# Patient Record
Sex: Male | Born: 1955 | Race: White | Hispanic: No | Smoking: Current every day smoker
Health system: Southern US, Community
[De-identification: ages and names within clinical notes are randomized; demographics above are authoritative.]

## PROBLEM LIST (undated history)

## (undated) DIAGNOSIS — M549 Dorsalgia, unspecified: Secondary | ICD-10-CM

## (undated) DIAGNOSIS — Z91148 Patient's other noncompliance with medication regimen for other reason: Secondary | ICD-10-CM

## (undated) DIAGNOSIS — I509 Heart failure, unspecified: Secondary | ICD-10-CM

## (undated) DIAGNOSIS — I10 Essential (primary) hypertension: Secondary | ICD-10-CM

## (undated) DIAGNOSIS — F99 Mental disorder, not otherwise specified: Secondary | ICD-10-CM

## (undated) DIAGNOSIS — F319 Bipolar disorder, unspecified: Secondary | ICD-10-CM

## (undated) DIAGNOSIS — I5032 Chronic diastolic (congestive) heart failure: Secondary | ICD-10-CM

## (undated) DIAGNOSIS — G709 Myoneural disorder, unspecified: Secondary | ICD-10-CM

## (undated) DIAGNOSIS — E1140 Type 2 diabetes mellitus with diabetic neuropathy, unspecified: Secondary | ICD-10-CM

## (undated) DIAGNOSIS — E785 Hyperlipidemia, unspecified: Secondary | ICD-10-CM

## (undated) DIAGNOSIS — G4733 Obstructive sleep apnea (adult) (pediatric): Secondary | ICD-10-CM

## (undated) DIAGNOSIS — I639 Cerebral infarction, unspecified: Secondary | ICD-10-CM

## (undated) DIAGNOSIS — G8929 Other chronic pain: Secondary | ICD-10-CM

## (undated) DIAGNOSIS — Z720 Tobacco use: Secondary | ICD-10-CM

## (undated) DIAGNOSIS — I4891 Unspecified atrial fibrillation: Secondary | ICD-10-CM

## (undated) DIAGNOSIS — Z9114 Patient's other noncompliance with medication regimen: Secondary | ICD-10-CM

## (undated) DIAGNOSIS — J449 Chronic obstructive pulmonary disease, unspecified: Secondary | ICD-10-CM

## (undated) DIAGNOSIS — Z8673 Personal history of transient ischemic attack (TIA), and cerebral infarction without residual deficits: Secondary | ICD-10-CM

## (undated) DIAGNOSIS — J9611 Chronic respiratory failure with hypoxia: Secondary | ICD-10-CM

## (undated) HISTORY — DX: Dorsalgia, unspecified: M54.9

## (undated) HISTORY — DX: Personal history of transient ischemic attack (TIA), and cerebral infarction without residual deficits: Z86.73

## (undated) HISTORY — DX: Type 2 diabetes mellitus with diabetic neuropathy, unspecified: E11.40

## (undated) HISTORY — DX: Other chronic pain: G89.29

## (undated) HISTORY — PX: NO PAST SURGERIES: SHX2092

## (undated) HISTORY — DX: Chronic diastolic (congestive) heart failure: I50.32

---

## 2006-11-20 ENCOUNTER — Emergency Department (HOSPITAL_COMMUNITY): Admission: EM | Admit: 2006-11-20 | Discharge: 2006-11-20 | Payer: Self-pay | Admitting: Emergency Medicine

## 2006-11-24 ENCOUNTER — Emergency Department (HOSPITAL_COMMUNITY): Admission: EM | Admit: 2006-11-24 | Discharge: 2006-11-24 | Payer: Self-pay | Admitting: Emergency Medicine

## 2006-11-29 ENCOUNTER — Ambulatory Visit: Payer: Self-pay | Admitting: Internal Medicine

## 2007-01-20 ENCOUNTER — Ambulatory Visit: Payer: Self-pay | Admitting: Internal Medicine

## 2007-01-26 ENCOUNTER — Emergency Department (HOSPITAL_COMMUNITY): Admission: EM | Admit: 2007-01-26 | Discharge: 2007-01-27 | Payer: Self-pay | Admitting: Emergency Medicine

## 2007-01-28 ENCOUNTER — Emergency Department (HOSPITAL_COMMUNITY): Admission: EM | Admit: 2007-01-28 | Discharge: 2007-01-28 | Payer: Self-pay | Admitting: *Deleted

## 2007-02-28 ENCOUNTER — Ambulatory Visit: Payer: Self-pay | Admitting: Internal Medicine

## 2007-02-28 LAB — CONVERTED CEMR LAB
ALT: 15 units/L (ref 0–53)
Alkaline Phosphatase: 98 units/L (ref 39–117)
BUN: 9 mg/dL (ref 6–23)
Basophils Absolute: 0 10*3/uL (ref 0.0–0.1)
Chloride: 107 meq/L (ref 96–112)
Creatinine, Ser: 1.13 mg/dL (ref 0.40–1.50)
Eosinophils Absolute: 0.2 10*3/uL (ref 0.2–0.7)
Eosinophils Relative: 1 % (ref 0–5)
Glucose, Bld: 120 mg/dL — ABNORMAL HIGH (ref 70–99)
Lymphs Abs: 2.5 10*3/uL (ref 0.7–4.0)
Monocytes Absolute: 0.6 10*3/uL (ref 0.1–1.0)
Platelets: 277 10*3/uL (ref 150–400)
RBC: 4.3 M/uL (ref 4.22–5.81)
Total Bilirubin: 0.4 mg/dL (ref 0.3–1.2)
Total Protein: 6.4 g/dL (ref 6.0–8.3)
Triglycerides: 171 mg/dL — ABNORMAL HIGH (ref <150)
WBC: 10.7 10*3/uL — ABNORMAL HIGH (ref 4.0–10.5)

## 2007-03-17 ENCOUNTER — Emergency Department (HOSPITAL_COMMUNITY): Admission: EM | Admit: 2007-03-17 | Discharge: 2007-03-17 | Payer: Self-pay | Admitting: Emergency Medicine

## 2007-03-19 ENCOUNTER — Emergency Department (HOSPITAL_COMMUNITY): Admission: EM | Admit: 2007-03-19 | Discharge: 2007-03-19 | Payer: Self-pay | Admitting: Emergency Medicine

## 2007-03-26 ENCOUNTER — Ambulatory Visit: Payer: Self-pay | Admitting: Internal Medicine

## 2007-04-15 ENCOUNTER — Ambulatory Visit: Payer: Self-pay | Admitting: Internal Medicine

## 2007-04-27 ENCOUNTER — Emergency Department (HOSPITAL_COMMUNITY): Admission: EM | Admit: 2007-04-27 | Discharge: 2007-04-27 | Payer: Self-pay | Admitting: Emergency Medicine

## 2007-04-27 ENCOUNTER — Encounter (INDEPENDENT_AMBULATORY_CARE_PROVIDER_SITE_OTHER): Payer: Self-pay | Admitting: Emergency Medicine

## 2007-05-04 ENCOUNTER — Emergency Department (HOSPITAL_COMMUNITY): Admission: EM | Admit: 2007-05-04 | Discharge: 2007-05-04 | Payer: Self-pay | Admitting: Emergency Medicine

## 2007-05-10 ENCOUNTER — Emergency Department (HOSPITAL_COMMUNITY): Admission: EM | Admit: 2007-05-10 | Discharge: 2007-05-10 | Payer: Self-pay | Admitting: Emergency Medicine

## 2007-05-12 ENCOUNTER — Ambulatory Visit: Payer: Self-pay | Admitting: Internal Medicine

## 2007-05-27 ENCOUNTER — Emergency Department (HOSPITAL_COMMUNITY): Admission: EM | Admit: 2007-05-27 | Discharge: 2007-05-27 | Payer: Self-pay | Admitting: Emergency Medicine

## 2007-05-29 ENCOUNTER — Emergency Department (HOSPITAL_COMMUNITY): Admission: EM | Admit: 2007-05-29 | Discharge: 2007-05-29 | Payer: Self-pay | Admitting: Emergency Medicine

## 2007-06-20 ENCOUNTER — Emergency Department (HOSPITAL_COMMUNITY): Admission: EM | Admit: 2007-06-20 | Discharge: 2007-06-20 | Payer: Self-pay | Admitting: Emergency Medicine

## 2007-07-21 ENCOUNTER — Ambulatory Visit: Payer: Self-pay | Admitting: Internal Medicine

## 2007-07-25 ENCOUNTER — Emergency Department (HOSPITAL_COMMUNITY): Admission: EM | Admit: 2007-07-25 | Discharge: 2007-07-25 | Payer: Self-pay | Admitting: Emergency Medicine

## 2007-07-29 ENCOUNTER — Encounter (INDEPENDENT_AMBULATORY_CARE_PROVIDER_SITE_OTHER): Payer: Self-pay | Admitting: *Deleted

## 2007-07-29 ENCOUNTER — Inpatient Hospital Stay (HOSPITAL_COMMUNITY): Admission: EM | Admit: 2007-07-29 | Discharge: 2007-07-31 | Payer: Self-pay | Admitting: Emergency Medicine

## 2007-08-21 ENCOUNTER — Emergency Department (HOSPITAL_COMMUNITY): Admission: EM | Admit: 2007-08-21 | Discharge: 2007-08-21 | Payer: Self-pay | Admitting: Emergency Medicine

## 2007-08-26 ENCOUNTER — Ambulatory Visit: Payer: Self-pay | Admitting: Internal Medicine

## 2007-08-26 ENCOUNTER — Encounter (INDEPENDENT_AMBULATORY_CARE_PROVIDER_SITE_OTHER): Payer: Self-pay | Admitting: Family Medicine

## 2007-08-26 LAB — CONVERTED CEMR LAB
Barbiturate Quant, Ur: NEGATIVE
Benzodiazepines.: NEGATIVE
Cocaine Metabolites: NEGATIVE
Marijuana Metabolite: NEGATIVE
Methadone: NEGATIVE

## 2007-09-12 ENCOUNTER — Ambulatory Visit: Payer: Self-pay | Admitting: *Deleted

## 2007-09-12 ENCOUNTER — Ambulatory Visit: Payer: Self-pay | Admitting: Internal Medicine

## 2007-09-18 ENCOUNTER — Emergency Department (HOSPITAL_COMMUNITY): Admission: EM | Admit: 2007-09-18 | Discharge: 2007-09-18 | Payer: Self-pay | Admitting: Emergency Medicine

## 2007-09-26 ENCOUNTER — Emergency Department (HOSPITAL_COMMUNITY): Admission: EM | Admit: 2007-09-26 | Discharge: 2007-09-26 | Payer: Self-pay | Admitting: Family Medicine

## 2007-10-17 ENCOUNTER — Ambulatory Visit: Payer: Self-pay | Admitting: Family Medicine

## 2007-10-17 LAB — CONVERTED CEMR LAB
BUN: 12 mg/dL (ref 6–23)
CO2: 26 meq/L (ref 19–32)
Cholesterol: 176 mg/dL (ref 0–200)
Creatinine, Ser: 1.2 mg/dL (ref 0.40–1.50)
Glucose, Bld: 221 mg/dL — ABNORMAL HIGH (ref 70–99)
Potassium: 4.3 meq/L (ref 3.5–5.3)
TSH: 0.974 microintl units/mL (ref 0.350–4.50)

## 2007-11-02 ENCOUNTER — Emergency Department (HOSPITAL_COMMUNITY): Admission: EM | Admit: 2007-11-02 | Discharge: 2007-11-02 | Payer: Self-pay | Admitting: Emergency Medicine

## 2007-11-26 ENCOUNTER — Encounter
Admission: RE | Admit: 2007-11-26 | Discharge: 2007-11-26 | Payer: Self-pay | Admitting: Physical Medicine & Rehabilitation

## 2008-05-02 ENCOUNTER — Emergency Department (HOSPITAL_COMMUNITY): Admission: EM | Admit: 2008-05-02 | Discharge: 2008-05-02 | Payer: Self-pay | Admitting: Emergency Medicine

## 2008-07-19 ENCOUNTER — Emergency Department (HOSPITAL_COMMUNITY): Admission: EM | Admit: 2008-07-19 | Discharge: 2008-07-19 | Payer: Self-pay | Admitting: Emergency Medicine

## 2008-07-24 ENCOUNTER — Emergency Department (HOSPITAL_COMMUNITY): Admission: EM | Admit: 2008-07-24 | Discharge: 2008-07-24 | Payer: Self-pay | Admitting: *Deleted

## 2008-08-04 ENCOUNTER — Emergency Department (HOSPITAL_COMMUNITY): Admission: EM | Admit: 2008-08-04 | Discharge: 2008-08-04 | Payer: Self-pay | Admitting: Emergency Medicine

## 2009-01-30 ENCOUNTER — Emergency Department (HOSPITAL_COMMUNITY): Admission: EM | Admit: 2009-01-30 | Discharge: 2009-01-30 | Payer: Self-pay | Admitting: Emergency Medicine

## 2009-03-03 ENCOUNTER — Emergency Department (HOSPITAL_COMMUNITY): Admission: EM | Admit: 2009-03-03 | Discharge: 2009-03-04 | Payer: Self-pay | Admitting: Emergency Medicine

## 2009-04-12 ENCOUNTER — Emergency Department (HOSPITAL_COMMUNITY): Admission: EM | Admit: 2009-04-12 | Discharge: 2009-04-12 | Payer: Self-pay | Admitting: Emergency Medicine

## 2009-04-16 ENCOUNTER — Emergency Department (HOSPITAL_COMMUNITY): Admission: EM | Admit: 2009-04-16 | Discharge: 2009-04-16 | Payer: Self-pay | Admitting: Emergency Medicine

## 2010-06-19 LAB — COMPREHENSIVE METABOLIC PANEL
ALT: 16 U/L (ref 0–53)
Albumin: 3.5 g/dL (ref 3.5–5.2)
Alkaline Phosphatase: 85 U/L (ref 39–117)
Calcium: 8.8 mg/dL (ref 8.4–10.5)
Chloride: 104 mEq/L (ref 96–112)
GFR calc Af Amer: >60 mL/min (ref >60)
GFR calc non Af Amer: >60 mL/min (ref >60)
Glucose, Bld: 151 mg/dL — ABNORMAL HIGH (ref 70–99)
Potassium: 3.4 mEq/L — ABNORMAL LOW (ref 3.5–5.1)
Total Protein: 6.9 g/dL (ref 6.0–8.3)

## 2010-06-19 LAB — POCT CARDIAC MARKERS
Myoglobin, poc: 78.7 ng/mL (ref 12–200)
Troponin i, poc: <0.05 ng/mL (ref 0.00–0.09)

## 2010-06-19 LAB — DIFFERENTIAL
Basophils Relative: 0 % (ref 0–1)
Lymphs Abs: 3.3 10*3/uL (ref 0.7–4.0)
Monocytes Absolute: 0.6 10*3/uL (ref 0.1–1.0)
Monocytes Relative: 5 % (ref 3–12)
Neutrophils Relative %: 68 % (ref 43–77)

## 2010-06-19 LAB — CBC
Platelets: 299 10*3/uL (ref 150–400)
RBC: 4.51 MIL/uL (ref 4.22–5.81)
WBC: 12.5 10*3/uL — ABNORMAL HIGH (ref 4.0–10.5)

## 2010-06-21 LAB — CBC
MCHC: 35 g/dL (ref 30.0–36.0)
Platelets: 240 10*3/uL (ref 150–400)
RDW: 13.1 % (ref 11.5–15.5)

## 2010-06-21 LAB — DIFFERENTIAL
Basophils Absolute: 0.1 10*3/uL (ref 0.0–0.1)
Eosinophils Relative: 2 % (ref 0–5)
Lymphocytes Relative: 21 % (ref 12–46)
Lymphs Abs: 2 10*3/uL (ref 0.7–4.0)

## 2010-06-21 LAB — BASIC METABOLIC PANEL
CO2: 34 mEq/L — ABNORMAL HIGH (ref 19–32)
Chloride: 100 mEq/L (ref 96–112)
Creatinine, Ser: 1.01 mg/dL (ref 0.4–1.5)
GFR calc non Af Amer: >60 mL/min (ref >60)
Sodium: 142 mEq/L (ref 135–145)

## 2010-06-21 LAB — URINALYSIS, ROUTINE W REFLEX MICROSCOPIC
Glucose, UA: >1000 mg/dL — AB
Hgb urine dipstick: NEGATIVE
Ketones, ur: NEGATIVE mg/dL
Specific Gravity, Urine: 1.021 (ref 1.005–1.030)
pH: 7 (ref 5.0–8.0)

## 2010-06-21 LAB — GLUCOSE, CAPILLARY: Glucose-Capillary: 200 mg/dL — ABNORMAL HIGH (ref 70–99)

## 2010-08-01 NOTE — H&P (Signed)
NAME:  Sean Warren, HETTICH NO.:  [id number]   MEDICAL RECORD NO.:  [id number]          PATIENT TYPE:  EMS   LOCATION:  MAJO                         FACILITY:  [facility]   PHYSICIAN:  Elmore Guise., M.D.DATE OF BIRTH:  20-Dec-1955   DATE OF ADMISSION:  07/29/2007  DATE OF DISCHARGE:                              HISTORY & PHYSICAL   INDICATION FOR ADMISSION:  Rapid atrial flutter.   HISTORY OF PRESENT ILLNESS:  Mr. Gervasi is a 55 year old white male with  a past medical history of hypertension, Parkinson's disease, obesity,  COPD, diabetes mellitus who presents with increasing shortness of  breath, malaise and decreased exertional tolerance.  On arrival, the  patient was noted to be in rapid atrial flutter with heart rates in the  160-170 range.  He was given adenosine, followed by Cardizem drip.  Now  his heart rate is in the 110-120 range.  He had a recent ER evaluation  on May 8.  At that time, he was started on Zithromax for possible UTI.  Over the last 2-3 days, he has had increased shortness of breath,  sweating and subjective fever, but denies palpitations or chest pain.  He has had productive cough of yellowish green phlegm.  He does continue  to smoke at least one pack per day.   REVIEW OF SYSTEMS:  Are as per HPI, otherwise negative.   CURRENT MEDICATIONS:  1. Lisinopril 40 mg daily.  2. Metformin 500 mg four times daily.  3. Zithromax 250 mg once daily.  4. Lopid 600 mg twice daily.  5. Potassium 10 mEq twice daily.  6. Cartia 240 mg daily.  7. Hydrochlorothiazide 25 mg daily.  8. Albuterol MDI every 4 hours p.r.n.  9. Sinemet 25/250 one tablet three times daily.  10.Trazodone 50 mg q.h.s.  11.BuSpar p.r.n.  12.Percocet 1-2 every 8 hours p.r.n.   ALLERGIES:  None.   FAMILY HISTORY:  Positive for heart disease and diabetes.   SOCIAL HISTORY:  Divorced.  Smokes one pack per day.  Denies alcohol.  He is ambulatory with a cane.   PHYSICAL  EXAMINATION:  VITAL SIGNS:  He is afebrile, temperature is  97.0, blood pressure 160/70, heart rate is 112 per minute, sat 99% on 2  liters nasal cannula.  He is currently on Cardizem drip at 20 mg per  hour.  GENERAL:  He is a very pleasant, middle-aged white male, alert and  oriented x4.  No acute distress.  He has no JVD and no bruits.  LUNGS:  Coarse breath sounds bilaterally with occasional wheeze.  HEART:  Irregular regular and distant.  ABDOMEN:  Obese, soft, nontender, nondistended.  EXTREMITIES:  Warm with 1+ pedal pulses and trace pretibial edema.   LABORATORY DATA:  Blood work shows an ABG of 7.47, pCO2 of 42 and pO2 of  160.  White count of 10.3, hemoglobin of 15.6, platelet count of 280.  Myoglobin of 124, MB of 1.6, Troponin I less than 0.05, potassium level  of 3.3, BUN and creatinine of 13 and 1.5 and glucose of 342.   Chest x-ray is  consistent with chronic bronchitic changes.  No acute  cardiopulmonary disease noted.  ECG showed rapid atrial flutter rate of  164 per minute with nonspecific ST-T wave changes.  His atrial flutter  is new compared to his old tracing.   IMPRESSION:  1. Newly diagnosed atrial flutter.  2. Hyperglycemia.  3. COPD.  4. Hypertension.  5. Dyslipidemia.  6. History of Parkinson's disease.   PLAN:  He will be admitted to telemetry bed.  We will continue Cardizem  drip at 20 mg an hour and start amiodarone 150 mg IV over 10 minutes  then followed by 1 mg per minute for 6 hours and 0.5 mg per minute for  18 hours.  We will check a TSH, serial cardiac enzymes as well as an  echo.  He will start on Lovenox 1 mg/kg subcu b.i.d. as well as Coumadin  and aspirin will also be started.  We will have sliding scale insulin to  help with his hyperglycemia and albuterol MDI on a p.r.n. basis.  We  will continue his Parkinson medications, Lisinopril, Lopid,  hydrochlorothiazide as before.  I will hold his metformin for now  because of his rising  creatinine.      Elmore Guise., M.D.  Electronically Signed     TWK/MEDQ  D:  07/29/2007  T:  07/29/2007  Job:  [id number]

## 2010-08-01 NOTE — Discharge Summary (Signed)
NAME:  Sean Warren, Sean Warren NO.:  [id number]   MEDICAL RECORD NO.:  [id number]          PATIENT TYPE:  INP   LOCATION:  2007                         FACILITY:  [facility]   PHYSICIAN:  Elmore Guise., M.D.DATE OF BIRTH:  01/11/1956   DATE OF ADMISSION:  07/29/2007  DATE OF DISCHARGE:  07/31/2007                               DISCHARGE SUMMARY   DISCHARGE DIAGNOSES:  1. Atrial flutter (now in normal sinus rhythm).  2. Hypertension.  3. Diabetes mellitus.  4. Dyslipidemia.  5. Chronic obstructive pulmonary disease  6. Bronchitis.  7. Parkinson disease.  8. Obesity.   HISTORY OF PRESENT ILLNESS:  Mr. Hair is a 55 year old white male with  multiple medical problems who presented with increasing shortness of  breath, malaise and fatigue.  On arrival to the emergency room, he was  noted to be in rapid atrial flutter.  He was admitted for rate control  and treatment.   HOSPITAL COURSE:  The patient's hospital course was uncomplicated.  He  was started on Cardizem drip in the emergency room with improvement in  his heart rate from the 160-170 range down into the 120 range.  He was  given amiodarone load with restoration of sinus rhythm.  He was treated  with Lovenox and started on Coumadin because of unknown duration of his  atrial arrhythmia.  His amiodarone was stopped after 24 hours.  We  decided because of long-term potential side effects to hold off on oral  dosing unless he has another breakthrough.  He has now been well rate  controlled with heart rates in the 70-80 range.  Initially, on  admission, his blood pressure was in the high 300 range.  He was  hydrated and started on sliding scale insulin with significant  improvement throughout his hospitalization.  He has now been up and  ambulatory with no significant problems.  He will be discharged home on  the following medications.  1. Lisinopril 40 mg daily.  2. Metformin 1000 mg twice daily.  3. Z-Pak.  4.  Lopid 600 mg two times daily.  5. Potassium 10 mEq two times daily.  6. Cartia 240 mg daily.  7. Hydrochlorothiazide 25 mg daily.  8. Albuterol MDI every 6 hours as needed.  9. Sinemet 25/250 one tablet three times daily.  10.Trazodone 50 mg nightly p.r.n.  11.BuSpar p.r.n.  12.Percocet p.r.n.   His new medications include  1. Coumadin 5 mg daily.  2. Baby aspirin 81 mg daily.   His labs on discharge showed a hemoglobin of 15.9, white blood cell  count of 7.9 and platelet count of 242.  His BUN and creatinine are 7  and 1.29 with a potassium level of 4.7.  His TSH was 0.376 and his INR  1.3 on discharge.  He is to come back to the office at [facility] on Monday for recheck of his INR at that time.  He has been  on Coumadin in the past but this was years ago.  If he has no further  atrial arrhythmias, I plan to treat him with Coumadin for  3 weeks and  then stopping.  His office visit will be with Dr. Reyes Ivan next Monday.  We will check a PT/INR at that time.  All his questions were answered  prior to discharge.      Elmore Guise., M.D.  Electronically Signed     TWK/MEDQ  D:  07/31/2007  T:  08/01/2007  Job:  [id number]

## 2010-12-08 LAB — D-DIMER, QUANTITATIVE: D-Dimer, Quant: 0.5 — ABNORMAL HIGH

## 2010-12-22 LAB — CBC
Hemoglobin: 13.9
RBC: 4.15 — ABNORMAL LOW
RDW: 13.1

## 2010-12-22 LAB — COMPREHENSIVE METABOLIC PANEL
Albumin: 3.2 — ABNORMAL LOW
Alkaline Phosphatase: 83
BUN: 11
Calcium: 8.8
Potassium: 3 — ABNORMAL LOW
Total Protein: 6.5

## 2010-12-22 LAB — CK TOTAL AND CKMB (NOT AT ARMC)
CK, MB: 3.4
Relative Index: 1.8
Total CK: 186

## 2010-12-22 LAB — DIFFERENTIAL
Basophils Relative: 0
Lymphocytes Relative: 17
Lymphs Abs: 1.7
Monocytes Absolute: 0.4
Monocytes Relative: 5
Neutro Abs: 7.7

## 2010-12-22 LAB — B-NATRIURETIC PEPTIDE (CONVERTED LAB): Pro B Natriuretic peptide (BNP): 34

## 2010-12-22 LAB — LITHIUM LEVEL: Lithium Lvl: <0.25 — ABNORMAL LOW

## 2010-12-26 LAB — I-STAT 8, (EC8 V) (CONVERTED LAB)
Acid-Base Excess: 7 — ABNORMAL HIGH
Acid-Base Excess: 8 — ABNORMAL HIGH
BUN: 11
Bicarbonate: 32.1 — ABNORMAL HIGH
Chloride: 102
HCT: 18 — ABNORMAL LOW
HCT: 43
Hemoglobin: 14.6
Hemoglobin: 6.1 — CL
Operator id: 151321
Operator id: 198171
Potassium: 3.6
Sodium: 140
Sodium: 141
TCO2: 33
pCO2, Ven: 44.5 — ABNORMAL LOW

## 2010-12-26 LAB — CREATININE, SERUM
Creatinine, Ser: 1.11
GFR calc non Af Amer: >60

## 2010-12-26 LAB — POCT I-STAT CREATININE
Creatinine, Ser: 1.2
Operator id: 151321

## 2010-12-26 LAB — DIFFERENTIAL
Eosinophils Absolute: 0.1
Eosinophils Relative: 1
Lymphocytes Relative: 31
Lymphs Abs: 3.1
Monocytes Relative: 6
Neutrophils Relative %: 61

## 2010-12-26 LAB — URINALYSIS, ROUTINE W REFLEX MICROSCOPIC
Glucose, UA: NEGATIVE
Ketones, ur: NEGATIVE
Nitrite: NEGATIVE
Specific Gravity, Urine: 1.03
pH: 7

## 2010-12-26 LAB — POCT CARDIAC MARKERS
Myoglobin, poc: 165
Operator id: 198171
Operator id: 198171
Troponin i, poc: <0.05
Troponin i, poc: <0.05

## 2010-12-26 LAB — D-DIMER, QUANTITATIVE: D-Dimer, Quant: 0.35

## 2010-12-26 LAB — RAPID URINE DRUG SCREEN, HOSP PERFORMED
Cocaine: NOT DETECTED
Tetrahydrocannabinol: NOT DETECTED

## 2010-12-26 LAB — CBC
HCT: 38.2 — ABNORMAL LOW
MCV: 97.8
RBC: 3.91 — ABNORMAL LOW
WBC: 9.9

## 2010-12-26 LAB — B-NATRIURETIC PEPTIDE (CONVERTED LAB): Pro B Natriuretic peptide (BNP): 111 — ABNORMAL HIGH

## 2011-05-11 ENCOUNTER — Emergency Department (HOSPITAL_COMMUNITY)
Admission: EM | Admit: 2011-05-11 | Discharge: 2011-05-11 | Disposition: A | Discharge status: Home / Self Care | Payer: Medicare Other | Attending: Emergency Medicine | Admitting: Emergency Medicine

## 2011-05-11 ENCOUNTER — Encounter (HOSPITAL_COMMUNITY): Payer: Self-pay | Admitting: Emergency Medicine

## 2011-05-11 DIAGNOSIS — F17200 Nicotine dependence, unspecified, uncomplicated: Secondary | ICD-10-CM | POA: Insufficient documentation

## 2011-05-11 DIAGNOSIS — G8929 Other chronic pain: Secondary | ICD-10-CM | POA: Insufficient documentation

## 2011-05-11 DIAGNOSIS — I10 Essential (primary) hypertension: Secondary | ICD-10-CM | POA: Insufficient documentation

## 2011-05-11 DIAGNOSIS — Z79899 Other long term (current) drug therapy: Secondary | ICD-10-CM | POA: Insufficient documentation

## 2011-05-11 DIAGNOSIS — I4891 Unspecified atrial fibrillation: Secondary | ICD-10-CM | POA: Insufficient documentation

## 2011-05-11 DIAGNOSIS — E669 Obesity, unspecified: Secondary | ICD-10-CM | POA: Insufficient documentation

## 2011-05-11 DIAGNOSIS — E119 Type 2 diabetes mellitus without complications: Secondary | ICD-10-CM | POA: Insufficient documentation

## 2011-05-11 DIAGNOSIS — E785 Hyperlipidemia, unspecified: Secondary | ICD-10-CM | POA: Insufficient documentation

## 2011-05-11 DIAGNOSIS — M549 Dorsalgia, unspecified: Secondary | ICD-10-CM | POA: Insufficient documentation

## 2011-05-11 HISTORY — DX: Unspecified atrial fibrillation: I48.91

## 2011-05-11 HISTORY — DX: Bipolar disorder, unspecified: F31.9

## 2011-05-11 HISTORY — DX: Essential (primary) hypertension: I10

## 2011-05-11 HISTORY — DX: Hyperlipidemia, unspecified: E78.5

## 2011-05-11 MED ORDER — CYCLOBENZAPRINE HCL 10 MG PO TABS: 10.0000 mg | ORAL_TABLET | Freq: Two times a day (BID) | ORAL | Status: DC | PRN

## 2011-05-11 MED ORDER — HYDROCODONE-ACETAMINOPHEN 5-500 MG PO TABS: 1.0000 | ORAL_TABLET | Freq: Four times a day (QID) | ORAL | Status: AC | PRN

## 2011-05-11 NOTE — ED Notes (Signed)
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  Patient has been taking OTC meds for pain with no relief.

## 2011-05-11 NOTE — Discharge Instructions (Signed)
Sean Warren I gave you a few Vicodin then to get you through until Tuesday when your medication arrives in [place]. Do not bend or lift any thing heavy and fell down. He can try ice on the left lower back to see if this helps the pain is well take ibuprofen 806 hours with food x24 hours. The Flexeril as a muscle relaxant may help as well. Do not drive with the Flexeril either. Opinion or consultation he needs to 20 minutes [place]. Followup with him as needed.    Back Exercises Back exercises help treat and prevent back injuries. The goal is to increase your strength in your belly (abdominal) and back muscles. These exercises can also help with flexibility. Start these exercises when told by your doctor. HOME CARE Back exercises include: Pelvic Tilt.  Lie on your back with your knees bent. Tilt your pelvis until the lower part of your back is against the floor. Hold this position 5 to 10 sec. Repeat this exercise 5 to 10 times.  Knee to Chest.  Pull 1 knee up against your chest and hold for 20 to 30 seconds. Repeat this with the other knee. This may be done with the other leg straight or bent, whichever feels better. Then, pull both knees up against your chest.  Sit-Ups or Curl-Ups.  Bend your knees 90 degrees. Start with tilting your pelvis, and do a partial, slow sit-up. Only lift your upper half 30 to 45 degrees off the floor. Take at least 2 to 3 seonds for each sit-up. Do not do sit-ups with your knees out straight. If partial sit-ups are difficult, simply do the above but with only tightening your belly (abdominal) muscles and holding it as told.  Hip-Lift.  Lie on your back with your knees flexed 90 degrees. Push down with your feet and shoulders as you raise your hips 2 inches off the floor. Hold for 10 seconds, repeat 5 to 10 times.  Back Arches.  Lie on your stomach. Prop yourself up on bent elbows. Slowly press on your hands, causing an arch in your low back. Repeat 3 to 5  times.  Shoulder-Lifts.  Lie face down with arms beside your body. Keep hips and belly pressed to floor as you slowly lift your head and shoulders off the floor.  Do not overdo your exercises. Be careful in the beginning. Exercises may cause you some mild back discomfort. If the pain lasts for more than 15 minutes, stop the exercises until you see your doctor. Improvement with exercise for back problems is slow.  Document Released: 04/07/2010 Document Revised: 11/15/2010 Document Reviewed: 04/07/2010 [facility] Patient Information 2012 [place], [place].

## 2011-05-11 NOTE — ED Notes (Signed)
Presents with c/o low back pain with history of same.  Re injured today.  States he is moving here and left his pain med at home.  States he takes Vicodin.

## 2011-05-11 NOTE — ED Notes (Signed)
MD at bedside. 

## 2011-05-11 NOTE — ED Provider Notes (Signed)
History     CSN: [id number]  Arrival date & time 05/11/11  1904   First MD Initiated Contact with Patient 05/11/11 1942      Chief Complaint  Patient presents with  . Back Pain    (Consider location/radiation/quality/duration/timing/severity/associated sxs/prior treatment) Patient is a 56 y.o. male presenting with back pain. The history is provided by the patient. No language interpreter was used.  Back Pain  This is a recurrent problem. The current episode started 6 to 12 hours ago. The problem occurs constantly. The problem has been gradually worsening. The pain is associated with lifting heavy objects. The pain is present in the lumbar spine. The quality of the pain is described as aching. The pain does not radiate. The pain is at a severity of 7/10. The pain is moderate. The symptoms are aggravated by twisting and certain positions. Pertinent negatives include no chest pain, no fever, no bowel incontinence, no perianal numbness, no bladder incontinence, no dysuria, no leg pain, no paresthesias, no tingling and no weakness. He has tried NSAIDs for the symptoms. The treatment provided no relief. Risk factors include obesity.   Care with complaint of left lower back pain that is chronic patient has his MRI reports with him from [place]. States that he is moving to [place] and he was lifting too much this morning. No fever bowel or bladder incontinence or numbness. No burning or shooting pain. States that he just needs some Vicodin to get him through until Tuesday when his medication arrives. States that he is on the Vicodin every 6 hours every day for the pain. States that he does need surgery for a bulging disc which is MRI verifies this but they will not do the surgery until he quit smoking. Past Medical History  Diagnosis Date  . Hypertension   . Diabetes mellitus   . Bipolar 1 disorder   . Hyperlipidemia   . Atrial fibrillation     History reviewed. No pertinent past  surgical history.  No family history on file.  History  Substance Use Topics  . Smoking status: Current Everyday Smoker    Types: Cigarettes  . Smokeless tobacco: Not on file  . Alcohol Use:       Review of Systems  Constitutional: Negative for fever.  Cardiovascular: Negative for chest pain.  Gastrointestinal: Negative for bowel incontinence.  Genitourinary: Negative for bladder incontinence and dysuria.  Musculoskeletal: Positive for back pain.  Neurological: Negative for tingling, weakness and paresthesias.  All other systems reviewed and are negative.    Allergies  Review of patient's allergies indicates no known allergies.  Home Medications   Current Outpatient Rx  Name Route Sig Dispense Refill  . ATENOLOL PO Oral Take 1 tablet by mouth daily.    . GLYBURIDE PO Oral Take 1 tablet by mouth every morning.    Marland Kitchen HYDROCODONE-ACETAMINOPHEN 10-325 MG PO TABS Oral Take 1 tablet by mouth every 6 (six) hours as needed. For pain    . LISINOPRIL PO Oral Take 1 tablet by mouth daily.    Marland Kitchen LITHIUM CARBONATE PO Oral Take 1 tablet by mouth daily.    Marland Kitchen OMEPRAZOLE 20 MG PO CPDR Oral Take 20 mg by mouth daily.    Marland Kitchen SIMVASTATIN 40 MG PO TABS Oral Take 40 mg by mouth every evening.    . WARFARIN SODIUM 5 MG PO TABS Oral Take 2.5-5 mg by mouth daily. Alternate 0.5 tablet and 1 tablet daily      BP  154/75  Pulse 50  Temp(Src) 98 F (36.7 C) (Oral)  Resp 17  SpO2 97%  Physical Exam  Nursing note and vitals reviewed. Constitutional: He is oriented to person, place, and time. He appears well-developed and well-nourished.  HENT:  Head: Normocephalic and atraumatic.  Eyes: Pupils are equal, round, and reactive to light.  Neck: Neck supple.  Cardiovascular: Normal rate and regular rhythm.   Pulmonary/Chest: Breath sounds normal. No respiratory distress.  Abdominal: Soft. He exhibits no distension.  Musculoskeletal: Normal range of motion. He exhibits tenderness. He exhibits no  edema.       LL back tenderness with no radiation  Neurological: He is alert and oriented to person, place, and time. No cranial nerve deficit.  Skin: Skin is warm and dry.  Psychiatric: He has a normal mood and affect.    ED Course  Procedures (including critical care time)  Labs Reviewed - No data to display No results found.   No diagnosis found.    MDM  Left lower back pain that is chronic especially since he was lifting time said that this morning. Has no radiating pain bowel or bladder problems. Patient has no nausea vomiting or fever. He is moving here from [place] he has his MRI report and hand states that he ran out of his Vicodin and has some or all the way Tuesday. We'll give him muscle relaxer, Vicodin and use ice as needed for the pain. Or so followup as needed. Return if worse.        Jethro Bastos, NP 05/11/11 2001

## 2011-05-11 NOTE — ED Provider Notes (Signed)
Medical screening examination/treatment/procedure(s) were performed by non-physician practitioner and as supervising physician I was immediately available for consultation/collaboration.  Juliet Rude. Rubin Payor, MD 05/11/11 2020

## 2011-05-11 NOTE — ED Notes (Signed)
MD at bedside.  NP at bedside 

## 2011-05-13 ENCOUNTER — Encounter (HOSPITAL_COMMUNITY): Payer: Self-pay | Admitting: *Deleted

## 2011-05-13 ENCOUNTER — Emergency Department (HOSPITAL_COMMUNITY): Payer: Medicare Other

## 2011-05-13 ENCOUNTER — Other Ambulatory Visit: Payer: Self-pay

## 2011-05-13 ENCOUNTER — Inpatient Hospital Stay (HOSPITAL_COMMUNITY): Payer: Medicare Other

## 2011-05-13 ENCOUNTER — Inpatient Hospital Stay (HOSPITAL_COMMUNITY)
Admission: EM | Admit: 2011-05-13 | Discharge: 2011-05-15 | DRG: 313 | Disposition: A | Discharge status: Home / Self Care | Payer: Medicare Other | Source: Ambulatory Visit | Attending: Cardiovascular Disease | Admitting: Cardiovascular Disease

## 2011-05-13 DIAGNOSIS — E119 Type 2 diabetes mellitus without complications: Secondary | ICD-10-CM

## 2011-05-13 DIAGNOSIS — Z7982 Long term (current) use of aspirin: Secondary | ICD-10-CM

## 2011-05-13 DIAGNOSIS — E876 Hypokalemia: Secondary | ICD-10-CM

## 2011-05-13 DIAGNOSIS — E785 Hyperlipidemia, unspecified: Secondary | ICD-10-CM

## 2011-05-13 DIAGNOSIS — I498 Other specified cardiac arrhythmias: Secondary | ICD-10-CM | POA: Diagnosis present

## 2011-05-13 DIAGNOSIS — R079 Chest pain, unspecified: Secondary | ICD-10-CM | POA: Diagnosis present

## 2011-05-13 DIAGNOSIS — J449 Chronic obstructive pulmonary disease, unspecified: Secondary | ICD-10-CM | POA: Diagnosis present

## 2011-05-13 DIAGNOSIS — R0789 Other chest pain: Principal | ICD-10-CM | POA: Diagnosis present

## 2011-05-13 DIAGNOSIS — Z7901 Long term (current) use of anticoagulants: Secondary | ICD-10-CM

## 2011-05-13 DIAGNOSIS — Z720 Tobacco use: Secondary | ICD-10-CM

## 2011-05-13 DIAGNOSIS — I4891 Unspecified atrial fibrillation: Secondary | ICD-10-CM

## 2011-05-13 DIAGNOSIS — F17200 Nicotine dependence, unspecified, uncomplicated: Secondary | ICD-10-CM | POA: Diagnosis present

## 2011-05-13 DIAGNOSIS — I639 Cerebral infarction, unspecified: Secondary | ICD-10-CM

## 2011-05-13 DIAGNOSIS — Z8673 Personal history of transient ischemic attack (TIA), and cerebral infarction without residual deficits: Secondary | ICD-10-CM

## 2011-05-13 DIAGNOSIS — I10 Essential (primary) hypertension: Secondary | ICD-10-CM

## 2011-05-13 DIAGNOSIS — F319 Bipolar disorder, unspecified: Secondary | ICD-10-CM

## 2011-05-13 DIAGNOSIS — Z79899 Other long term (current) drug therapy: Secondary | ICD-10-CM

## 2011-05-13 DIAGNOSIS — J4489 Other specified chronic obstructive pulmonary disease: Secondary | ICD-10-CM | POA: Diagnosis present

## 2011-05-13 DIAGNOSIS — K5900 Constipation, unspecified: Secondary | ICD-10-CM | POA: Diagnosis present

## 2011-05-13 DIAGNOSIS — I509 Heart failure, unspecified: Secondary | ICD-10-CM | POA: Diagnosis present

## 2011-05-13 DIAGNOSIS — G4733 Obstructive sleep apnea (adult) (pediatric): Secondary | ICD-10-CM

## 2011-05-13 HISTORY — DX: Chronic obstructive pulmonary disease, unspecified: J44.9

## 2011-05-13 HISTORY — DX: Heart failure, unspecified: I50.9

## 2011-05-13 HISTORY — DX: Obstructive sleep apnea (adult) (pediatric): G47.33

## 2011-05-13 LAB — DIFFERENTIAL
Lymphocytes Relative: 36 % (ref 12–46)
Lymphs Abs: 2.2 10*3/uL (ref 0.7–4.0)
Monocytes Absolute: 0.3 10*3/uL (ref 0.1–1.0)
Monocytes Relative: 4 % (ref 3–12)

## 2011-05-13 LAB — POCT I-STAT TROPONIN I: Troponin i, poc: 0.01 ng/mL (ref 0.00–0.08)

## 2011-05-13 LAB — CARDIAC PANEL(CRET KIN+CKTOT+MB+TROPI)
CK, MB: 2.5 ng/mL (ref 0.3–4.0)
CK, MB: 2.6 ng/mL (ref 0.3–4.0)
Total CK: 46 U/L (ref 7–232)

## 2011-05-13 LAB — COMPREHENSIVE METABOLIC PANEL
ALT: 5 U/L (ref 0–53)
Albumin: 3.4 g/dL — ABNORMAL LOW (ref 3.5–5.2)
Alkaline Phosphatase: 71 U/L (ref 39–117)
BUN: 12 mg/dL (ref 6–23)
BUN: 11 mg/dL (ref 6–23)
CO2: 28 mEq/L (ref 19–32)
Calcium: 9.7 mg/dL (ref 8.4–10.5)
Creatinine, Ser: 1.19 mg/dL (ref 0.50–1.35)
GFR calc Af Amer: >90 mL/min (ref >90)
GFR calc non Af Amer: 78 mL/min — ABNORMAL LOW (ref >90)
Glucose, Bld: 100 mg/dL — ABNORMAL HIGH (ref 70–99)
Potassium: 3 mEq/L — ABNORMAL LOW (ref 3.5–5.1)
Sodium: 144 mEq/L (ref 135–145)
Total Bilirubin: 0.3 mg/dL (ref 0.3–1.2)
Total Bilirubin: 0.2 mg/dL — ABNORMAL LOW (ref 0.3–1.2)
Total Protein: 6.6 g/dL (ref 6.0–8.3)

## 2011-05-13 LAB — CBC
MCV: 94.4 fL (ref 78.0–100.0)
Platelets: 226 10*3/uL (ref 150–400)
RDW: 13.7 % (ref 11.5–15.5)
WBC: 6 10*3/uL (ref 4.0–10.5)

## 2011-05-13 LAB — PROTIME-INR
INR: 1.13 (ref 0.00–1.49)
Prothrombin Time: 14.7 seconds (ref 11.6–15.2)
Prothrombin Time: 15.7 seconds — ABNORMAL HIGH (ref 11.6–15.2)

## 2011-05-13 LAB — URINALYSIS, ROUTINE W REFLEX MICROSCOPIC
Glucose, UA: NEGATIVE mg/dL
Leukocytes, UA: NEGATIVE
Protein, ur: NEGATIVE mg/dL
Specific Gravity, Urine: 1.009 (ref 1.005–1.030)

## 2011-05-13 LAB — PRO B NATRIURETIC PEPTIDE: Pro B Natriuretic peptide (BNP): 1203 pg/mL — ABNORMAL HIGH (ref 0–125)

## 2011-05-13 LAB — LITHIUM LEVEL: Lithium Lvl: <0.25 mEq/L — ABNORMAL LOW (ref 0.80–1.40)

## 2011-05-13 LAB — MAGNESIUM: Magnesium: 2.4 mg/dL (ref 1.5–2.5)

## 2011-05-13 MED ORDER — POLYETHYLENE GLYCOL 3350 17 G PO PACK
17.0000 g | PACK | Freq: Every day | ORAL | Status: DC
  Administered 2011-05-13 – 2011-05-14 (×2): 17 g via ORAL
  Filled 2011-05-13 (×2): qty 1

## 2011-05-13 MED ORDER — ACETAMINOPHEN 325 MG PO TABS
650.0000 mg | ORAL_TABLET | ORAL | Status: DC | PRN
  Administered 2011-05-14: 650 mg via ORAL
  Filled 2011-05-13: qty 2

## 2011-05-13 MED ORDER — POTASSIUM CHLORIDE CRYS ER 20 MEQ PO TBCR
40.0000 meq | EXTENDED_RELEASE_TABLET | Freq: Once | ORAL | Status: AC
  Administered 2011-05-13: 40 meq via ORAL
  Filled 2011-05-13: qty 2

## 2011-05-13 MED ORDER — NITROGLYCERIN IN D5W 200-5 MCG/ML-% IV SOLN
5.0000 ug/min | Freq: Once | INTRAVENOUS | Status: AC
  Administered 2011-05-13: 5 ug/min via INTRAVENOUS
  Filled 2011-05-13: qty 250

## 2011-05-13 MED ORDER — INSULIN ASPART 100 UNIT/ML ~~LOC~~ SOLN
0.0000 [IU] | Freq: Three times a day (TID) | SUBCUTANEOUS | Status: DC
  Filled 2011-05-13: qty 3

## 2011-05-13 MED ORDER — ASPIRIN 81 MG PO CHEW: 324.0000 mg | CHEWABLE_TABLET | ORAL | Status: DC

## 2011-05-13 MED ORDER — HEPARIN SOD (PORCINE) IN D5W 100 UNIT/ML IV SOLN
1900.0000 [IU]/h | INTRAVENOUS | Status: DC
  Administered 2011-05-13: 1250 [IU]/h via INTRAVENOUS
  Administered 2011-05-14: 1600 [IU]/h via INTRAVENOUS
  Filled 2011-05-13 (×2): qty 250

## 2011-05-13 MED ORDER — ONDANSETRON HCL 4 MG/2ML IJ SOLN
4.0000 mg | Freq: Four times a day (QID) | INTRAMUSCULAR | Status: DC | PRN
  Administered 2011-05-14 – 2011-05-15 (×3): 4 mg via INTRAVENOUS
  Filled 2011-05-13 (×3): qty 2

## 2011-05-13 MED ORDER — GI COCKTAIL ~~LOC~~
30.0000 mL | Freq: Once | ORAL | Status: AC
  Administered 2011-05-13: 30 mL via ORAL
  Filled 2011-05-13: qty 30

## 2011-05-13 MED ORDER — PANTOPRAZOLE SODIUM 40 MG PO TBEC
40.0000 mg | DELAYED_RELEASE_TABLET | Freq: Every day | ORAL | Status: DC
  Administered 2011-05-13 – 2011-05-14 (×2): 40 mg via ORAL
  Filled 2011-05-13 (×2): qty 1

## 2011-05-13 MED ORDER — ASPIRIN EC 81 MG PO TBEC
81.0000 mg | DELAYED_RELEASE_TABLET | Freq: Every day | ORAL | Status: DC
  Administered 2011-05-14 – 2011-05-15 (×2): 81 mg via ORAL
  Filled 2011-05-13 (×2): qty 1

## 2011-05-13 MED ORDER — ASPIRIN 81 MG PO CHEW
324.0000 mg | CHEWABLE_TABLET | Freq: Once | ORAL | Status: AC
  Administered 2011-05-13: 324 mg via ORAL
  Filled 2011-05-13: qty 4

## 2011-05-13 MED ORDER — MORPHINE SULFATE 4 MG/ML IJ SOLN
4.0000 mg | Freq: Once | INTRAMUSCULAR | Status: AC
  Administered 2011-05-13: 4 mg via INTRAVENOUS
  Filled 2011-05-13: qty 1

## 2011-05-13 MED ORDER — ATROPINE SULFATE 1 MG/ML IJ SOLN
INTRAMUSCULAR | Status: AC
  Filled 2011-05-13: qty 1

## 2011-05-13 MED ORDER — HEPARIN BOLUS VIA INFUSION
2000.0000 [IU] | Freq: Once | INTRAVENOUS | Status: AC
  Administered 2011-05-13: 2000 [IU] via INTRAVENOUS
  Filled 2011-05-13: qty 2000

## 2011-05-13 MED ORDER — ONDANSETRON HCL 4 MG/2ML IJ SOLN
4.0000 mg | Freq: Once | INTRAMUSCULAR | Status: AC
  Administered 2011-05-13: 4 mg via INTRAVENOUS
  Filled 2011-05-13: qty 2

## 2011-05-13 MED ORDER — NITROGLYCERIN IN D5W 200-5 MCG/ML-% IV SOLN
3.0000 ug/min | INTRAVENOUS | Status: DC
  Administered 2011-05-14: 10 ug/min via INTRAVENOUS

## 2011-05-13 MED ORDER — ATROPINE SULFATE 0.1 MG/ML IJ SOLN
0.5000 mg | Freq: Once | INTRAMUSCULAR | Status: AC
  Administered 2011-05-13: 0.5 mg via INTRAVENOUS
  Filled 2011-05-13 (×2): qty 10

## 2011-05-13 MED ORDER — NITROGLYCERIN 0.4 MG SL SUBL
0.4000 mg | SUBLINGUAL_TABLET | SUBLINGUAL | Status: DC | PRN
  Administered 2011-05-14 (×3): 0.4 mg via SUBLINGUAL

## 2011-05-13 MED ORDER — SODIUM CHLORIDE 0.9 % IV SOLN: Freq: Once | INTRAVENOUS | Status: DC

## 2011-05-13 MED ORDER — HEPARIN BOLUS VIA INFUSION
4000.0000 [IU] | Freq: Once | INTRAVENOUS | Status: AC
  Administered 2011-05-13: 4000 [IU] via INTRAVENOUS

## 2011-05-13 MED ORDER — SIMVASTATIN 40 MG PO TABS
40.0000 mg | ORAL_TABLET | Freq: Every day | ORAL | Status: DC
  Administered 2011-05-13 – 2011-05-14 (×2): 40 mg via ORAL
  Filled 2011-05-13 (×3): qty 1

## 2011-05-13 MED ORDER — ATROPINE SULFATE 1 MG/ML IJ SOLN
0.5000 mg | Freq: Once | INTRAMUSCULAR | Status: DC
  Filled 2011-05-13: qty 1

## 2011-05-13 MED ORDER — POTASSIUM CHLORIDE 10 MEQ/100ML IV SOLN
10.0000 meq | Freq: Once | INTRAVENOUS | Status: AC
  Administered 2011-05-13: 10 meq via INTRAVENOUS
  Filled 2011-05-13: qty 100

## 2011-05-13 MED ORDER — SODIUM CHLORIDE 0.9 % IV SOLN
INTRAVENOUS | Status: DC
  Administered 2011-05-13: 12:00:00 via INTRAVENOUS

## 2011-05-13 MED ORDER — NITROGLYCERIN 0.4 MG SL SUBL
0.4000 mg | SUBLINGUAL_TABLET | SUBLINGUAL | Status: DC | PRN
  Administered 2011-05-13 (×2): 0.4 mg via SUBLINGUAL
  Filled 2011-05-13: qty 25

## 2011-05-13 NOTE — ED Notes (Signed)
HR noted to be 48, EDP notified

## 2011-05-13 NOTE — ED Notes (Signed)
Pt reporting 10/10 central CP. No radiation, Confirming SOB, nausea. Reporting " I know something is in there" regarding his CP. Pt noted to be 83 HR on monitor. Hypertensive. EDP aware. External pacing at bedside along with crash cart. Pt on the pads

## 2011-05-13 NOTE — ED Notes (Signed)
Patient reports he had onset of chest pressure, chills, and n/v today.  He took a nitro at 0630 w/o relief.   He states it feels like something is lodged in his chest.  He also reports tingling in his left arm.

## 2011-05-13 NOTE — ED Notes (Signed)
Pt noted to be 38 HR. CP 8/10 central, reporting SOB. Pt on 2 L. Clarified with EDP regarding 160 cc/hr fluid with hx of CHF. Pt alert and oriented. Responding appropriately.

## 2011-05-13 NOTE — Progress Notes (Signed)
I was called to assess Sean Warren on the basis of his ongoing pain.  He is 56 year old gentleman with a history of Afib, diabetes, hypertension, and a reported history of CHF who presents with epigastric discomfort since around 6 am today. He was seen by Dr. Mayford Knife this afternoon and admitted to the step down unit on IV infusions of Heparin and Nitroglycerine.   His ECG's have not been suggestive of ischemia and his initial cardiac troponin was normal. A repeat troponin this evening is mildly elevated at 0.38. BNP 1203, D-Dimer normal. Initial K was 2.7, then 3.0 following repletion. WBC normal, as are his LFTs.   On my exam, he is in mild distress, but is able to carry on a normal conversation. HR 60, BP 186/93, RR 16, Pulse Ox 99% 2L [place]. Non-toxic appearing without diaphoresis. He is not using accessory muscle to assist his breathing. His JVP is challenging to assess given his body habitus. I do not hear crackle on lung exam. Abdomen is soft & obese. He has point tenderness in the epigastric and LUQ regions. No rebound tenderness. No guarding. No RUQ tenderness. Extremities are warm and well-perfused. No edema.   The etiology of his pain remains a bit unclear. His symptoms are not typical of a coronary syndrome, particularly in the pinpoint, reproducible tenderness that he feels in the epigastric and LUQ regions. His ECG does not suggest ischemia, though the possibility exists that this is electrocardiographically silent ischemia (circumflex territory). His troponin 0.38 from this evening muddies the waters a bit. We will need to keep a close an eye on his enzyme trend over night. With a normal D-dimer, no hypoxia, no tachycardia, and no exam findings suggestive of a DVT, I do not suspect a pulmonary embolism. With hypokalemia, abdominal tenderness, and no bowel movements in the past 72 hours, this could be explained by constipation and/or an ileus.   Plan for now: - Abdominal plain film to look for  dilated bowel loops - Potassium repletion and laxative administration to encourage a bowel movement - Continued trending of cardiac enzymes - Continued IV Heparin. IV NTG drip to be titrated to a SBP in the 110-120 range and/or pain relief.  - If his enzymes continue to trend up without symptom relief as we purse alternative diagnoses, then we will re-visit the need for coronary angiography.   I conveyed my impressions to Dr. Mayford Knife by phone. Plan discussed with the patient and his questions were answered.  Sean Pontes, MD Cardiology Fellow On-Call  [phone]

## 2011-05-13 NOTE — Consult Note (Signed)
ANTICOAGULATION CONSULT NOTE - Initial Consult  Pharmacy Consult for Heparin Indication: CP/afib  No Known Allergies  Patient Measurements: Height: 5\' 8"  (172.7 cm) Weight: 216 lb (97.977 kg) IBW/kg (Calculated) : 68.4  Heparin Dosing Weight: 89.2kg  Vital Signs: Temp: 98.3 F (36.8 C) (02/24 1100) Temp src: Oral (02/24 1100) BP: 151/63 mmHg (02/24 1530) Pulse Rate: 44  (02/24 1530)  Labs:  Basename 05/13/11 1138  HGB --  HCT --  PLT --  APTT --  LABPROT 14.7  INR 1.13  HEPARINUNFRC --  CREATININE 1.19  CKTOTAL --  CKMB --  TROPONINI --   Estimated Creatinine Clearance: 77.7 ml/min (by C-G formula based on Cr of 1.19).  Medical History: Past Medical History  Diagnosis Date  . Hypertension   . Diabetes mellitus   . Bipolar 1 disorder   . Hyperlipidemia   . Atrial fibrillation   . COPD (chronic obstructive pulmonary disease)   . Obstructive sleep apnea   . CHF (congestive heart failure)    Assessment: 55yom on coumadin pta for afib. INR on admission is subtherapeutic. Patient admitted with CP and to begin heparin for bridging.  Goal of Therapy:  Heparin level 0.3-0.7 units/ml   Plan:  1) Heparin bolus 4000 units x 1 2) Heparin drip at 1250 units/hr 3) 6h heparin level 4) Daily heparin level and CBC  Fredrik Rigger 05/13/2011,3:54 PM

## 2011-05-13 NOTE — ED Notes (Signed)
ekg to Dr.Davidson.

## 2011-05-13 NOTE — ED Notes (Signed)
Patient reports onset of chest pain today at 0600 with n/v and sweating.  Patient has hx of heartfailure and hypertension.  He is on coumadin,  Post cva.  Patient noted to be bradycardic on monitor.  Patient placed in room 12 and ERMD made aware of vital signs.  Patient reports he has not had an MI.  Patient has hx of afib, states his heart usually runs fast.  Patient is on atenolol

## 2011-05-13 NOTE — ED Notes (Signed)
New EKG done b/c of increase in heart rate after Atropine given.

## 2011-05-13 NOTE — ED Notes (Signed)
Getting ready to give pt atropine for HR 48, Dr. Mayford Knife at bedside, reporting no need to give atropine to patient .Withheld medication per Admitting

## 2011-05-13 NOTE — ED Provider Notes (Addendum)
History     CSN: [id number]  Arrival date & time 05/13/11  1056   First MD Initiated Contact with Patient 05/13/11 1114      Chief Complaint  Patient presents with  . Chest Pain    (Consider location/radiation/quality/duration/timing/severity/associated sxs/prior treatment) HPI Comments: Pt had onset of chest pain felt in the center of the chest about 6 A.M.  He feels the pain in the center of the chest.  It is like something stuck in his chest, though he is able to swallow liquids.  The pain radiated to the left arm, with some tingling feeling.  He has a history of atrial fibrillation, CHF, diabetes, COPD, chronic back pain.  He had an admission for rapid atrial fibrillation at [facility] in 2009.  He has subsequently lived in [place], and recently moved back to [place].  Patient is a 56 y.o. male presenting with chest pain.  Chest Pain The chest pain began 3 - 5 hours ago. Episode Length: A constant pain since onset at 6 A.M. The chest pain is unchanged. At its most intense, the pain is at 10/10. The pain is currently at 10/10. The severity of the pain is severe. Quality: Like something stuck in his chest. The pain radiates to the left arm. Primary symptoms include shortness of breath. Pertinent negatives for primary symptoms include no abdominal pain, no nausea and no vomiting. He tried nothing for the symptoms. Risk factors include male gender, obesity, lack of exercise and sedentary lifestyle.  His past medical history is significant for COPD, CHF and diabetes. Past medical history comments: Atrial fibrillation     Past Medical History  Diagnosis Date  . Hypertension   . Diabetes mellitus   . Bipolar 1 disorder   . Hyperlipidemia   . Atrial fibrillation     No past surgical history on file.  No family history on file.  History  Substance Use Topics  . Smoking status: Current Everyday Smoker    Types: Cigarettes  . Smokeless tobacco: Not on file  . Alcohol  Use:       Review of Systems  Constitutional: Negative.   HENT: Negative.   Eyes: Negative.   Respiratory: Positive for shortness of breath.   Cardiovascular: Positive for chest pain.  Gastrointestinal: Negative for nausea, vomiting, abdominal pain and diarrhea.  Genitourinary: Negative.   Musculoskeletal: Positive for back pain.  Skin: Negative.   Neurological: Negative.   Psychiatric/Behavioral: Negative.     Allergies  Review of patient's allergies indicates no known allergies.  Home Medications   Current Outpatient Rx  Name Route Sig Dispense Refill  . ATENOLOL PO Oral Take 1 tablet by mouth daily.    . CYCLOBENZAPRINE HCL 10 MG PO TABS Oral Take 1 tablet (10 mg total) by mouth 2 (two) times daily as needed for muscle spasms. 20 tablet 0  . GLYBURIDE PO Oral Take 1 tablet by mouth every morning.    Marland Kitchen HYDROCODONE-ACETAMINOPHEN 10-325 MG PO TABS Oral Take 1 tablet by mouth every 6 (six) hours as needed. For pain    . HYDROCODONE-ACETAMINOPHEN 5-500 MG PO TABS Oral Take 1-2 tablets by mouth every 6 (six) hours as needed for pain. 15 tablet 0  . LISINOPRIL PO Oral Take 1 tablet by mouth daily.    Marland Kitchen LITHIUM CARBONATE PO Oral Take 1 tablet by mouth daily.    Marland Kitchen OMEPRAZOLE 20 MG PO CPDR Oral Take 20 mg by mouth daily.    Marland Kitchen SIMVASTATIN 40  MG PO TABS Oral Take 40 mg by mouth every evening.    . WARFARIN SODIUM 5 MG PO TABS Oral Take 2.5-5 mg by mouth daily. Alternate 0.5 tablet and 1 tablet daily      BP 179/69  Pulse 39  Temp(Src) 98.3 F (36.8 C) (Oral)  Resp 22  SpO2 97%  Physical Exam  Constitutional: He is oriented to person, place, and time.       Obese middle aged man with complaint of chest pain, and bradycardia noted on EKG and on monitor.  HENT:  Head: Normocephalic and atraumatic.  Right Ear: External ear normal.  Left Ear: External ear normal.  Mouth/Throat: Oropharynx is clear and moist.  Eyes: Conjunctivae and EOM are normal. Pupils are equal, round, and  reactive to light.  Neck: Normal range of motion. No JVD present. No thyromegaly present.  Cardiovascular: Regular rhythm and normal heart sounds.        Bradycardia of 40.  Pulmonary/Chest: Effort normal and breath sounds normal. No respiratory distress. He has no wheezes. He has no rales. He exhibits no tenderness.  Abdominal: Soft. Bowel sounds are normal.  Neurological: He is alert and oriented to person, place, and time.       No sensory or motor deficit.  Skin: Skin is warm and dry.  Psychiatric: He has a normal mood and affect. His behavior is normal.    ED Course  Procedures (including critical care time)  11:15 AM  Date: 05/13/2011  Rate: 39  Rhythm: sinus bradycardia  QRS Axis: right  Intervals: normal  ST/T Wave abnormalities: normal  Conduction Disutrbances: Incomplete right bundle branch block  Narrative Interpretation: Abnormal EKG.  Old EKG Reviewed: changes noted-- was in atrial flutter on 03/03/2009.  Tracing done at 13:34  Date: 05/13/2011  Rate: 58  Rhythm: normal sinus rhythm  QRS Axis: normal  Intervals: normal\ QRS:  Poor R wave progressions suggests possible old AMI vs lead placement.  ST/T Wave abnormalities: normal  Conduction Disutrbances: Incomplete right bundle branch block.  Narrative Interpretation: Abnormal EKG, rate improved.    Old EKG Reviewed: changes noted--rate more rapid.  Tracing done at 14:06  Date: 05/13/2011  Rate: 50  Rhythm: normal sinus rhythm  QRS Axis: right  Intervals: normal QRS:  Poor R wave progression in precordial leads suggests possible old AMI.   ST/T Wave abnormalities: normal  Conduction Disutrbances: Incomplete right bundle branch block.  Narrative Interpretation: Abnormal EKG  Old EKG Reviewed: unchanged     2:10 PM Bradycardia was resolved with IV atropine.  Lab tests showed normal TNI, elevated BNP. He continues to have chest pain.  IV NTG started and remedicated with morphine/zofran for pain with  morphine and Zofran.  Call to Kristeen Miss, M.D., on call for cardiology --> admit to a stepdown unit.   1. Chest pain             Carleene Cooper III, MD 05/13/11 1420  Carleene Cooper III, MD 05/29/11 0800

## 2011-05-13 NOTE — Consult Note (Addendum)
ANTICOAGULATION CONSULT NOTE - Initial Consult  Pharmacy Consult for Heparin Indication: Chest pain / h/o Afib  No Known Allergies  Patient Measurements: Height: 5\' 8"  (172.7 cm) Weight: 216 lb (97.977 kg) IBW/kg (Calculated) : 68.4  Heparin Dosing Weight: 89.2kg  Vital Signs: Temp: 98.1 F (36.7 C) (02/24 1900) BP: 111/65 mmHg (02/24 1800) Pulse Rate: 81  (02/24 1800)  Labs:  Basename 05/13/11 2154 05/13/11 1804 05/13/11 1758 05/13/11 1138  HGB -- -- 13.2 --  HCT -- -- 38.6* --  PLT -- -- 226 --  APTT -- -- -- --  LABPROT -- -- 15.7* 14.7  INR -- -- 1.22 1.13  HEPARINUNFRC <0.10* -- -- --  CREATININE -- -- 1.04 1.19  CKTOTAL -- 46 -- --  CKMB -- 2.5 -- --  TROPONINI -- 0.38* -- --   Estimated Creatinine Clearance: 88.9 ml/min (by C-G formula based on Cr of 1.04).  Assessment: 56 yo male with chest pain, h/o Afib with subtherapeutic INR, for Heparin.  Infusing appropriately per RN.  Goal of Therapy:  Heparin level 0.3-0.7 units/ml   Plan:  Heparin 2000 units IV bolus, then increase heparin 1600 units/hr Follow-up am labs.  Eddie Candle 05/13/2011,11:08 PM   Addendum: AM Level 0.23  Infusing OK.   Heparin 2000 units IV bolus, then increase heparin 1900 units/hr.  Check heparin level in 6 hours. Geannie Risen, PharmD, BCPS

## 2011-05-13 NOTE — ED Notes (Signed)
Receiving RN currently involved in provided pt a bath.

## 2011-05-13 NOTE — H&P (Addendum)
Admit date: 05/13/2011 Referring Physician  Dr. Ignacia Palma Primary Cardiologist  Used to see Dr. Reyes Ivan Chief complaint/reason for admission:  Chest pain  HPI: This is a 55yo WM with history of bipolar disorder, HTN, dyslipidemia, atrial fibrillation on Coumadin who presented to the ER with complaints of chest pain.  He states that he felt like he swallowed something that got stuck and would not go away.  He last ate at 1PM at [employer] but felt fine until 11PM when he laid down.  He states that he did not sleep well last night due to worrying.  This am around 6am he noticed a sensation of something lodged in the center of his chest which is still persistent.  There is no radiation of the discomfort.  He has also noticed some left arm numbness and tingling.  He has chronic SOB from his COPD which is stable.  He says that he vomited a few times after the chest discomfort started.  He did not have any diaphoresis.  He currently says his discomfort is a 10/10.  He took SL NTG x 2 at home and then in ER without any improvement.  He says he has had a lot of gas and belching as well.      PMH:    Past Medical History  Diagnosis Date  . Hypertension   . Diabetes mellitus   . Bipolar 1 disorder   . Hyperlipidemia   . Atrial fibrillation   . COPD (chronic obstructive pulmonary disease)   . Obstructive sleep apnea    Remote history of Cardiac cath 20 years ago with ? Very mild CAD   . CHF (congestive heart failure)       PSH:   History reviewed. No pertinent past surgical history.  ALLERGIES:   Review of patient's allergies indicates no known allergies.  Prior to Admit Meds:   (Not in a hospital admission) Family HX:   No family history on file. Social HX:    History   Social History  . Marital Status: Divorced    Spouse Name: N/A    Number of Children: N/A  . Years of Education: N/A   Occupational History  . Not on file.   Social History Main Topics  . Smoking status: Current  Everyday Smoker -- 1.5 packs/day for 29 years    Types: Cigarettes  . Smokeless tobacco: Not on file  . Alcohol Use: No  . Drug Use: Not on file  . Sexually Active: Not on file   Other Topics Concern  . Not on file   Social History Narrative  . No narrative on file     ROS:  All 11 ROS were addressed and are negative except what is stated in the HPI  PHYSICAL EXAM Filed Vitals:   05/13/11 1430  BP: 161/89  Pulse: 49  Temp:   Resp: 12   General: Well developed, well nourished, in no acute distress Head: Eyes PERRLA, No xanthomas.   Normal cephalic and atramatic  Lungs:   Clear bilaterally to auscultation and percussion. Heart:   HRRR S1 S2 Pulses are 2+ & equal.            No carotid bruit. No JVD.  No abdominal bruits. No femoral bruits. Abdomen: Bowel sounds are positive, abdomen soft and non-tender without masses Extremities:   No clubbing, cyanosis or edema.  DP +1 Neuro: Alert and oriented X 3. Psych:  Good affect, responds appropriately   Labs:  Lab Results  Component Value Date   WBC 12.5* 03/03/2009   HGB 15.5 03/03/2009   HCT 44.9 03/03/2009   MCV 99.6 03/03/2009   PLT 299 03/03/2009    Lab 05/13/11 1138  NA 142  K 2.7*  CL 100  CO2 31  BUN 12  CREATININE 1.19  CALCIUM 9.7  PROT 6.6  BILITOT 0.3  ALKPHOS 75  ALT <5  AST 10  GLUCOSE 121*   Lab Results  Component Value Date   CKTOTAL 90 04/27/2007   CKMB 3.4 03/19/2007   TROPONINI  Value: 0.03        NO INDICATION OF MYOCARDIAL INJURY. 03/19/2007      Lab Results  Component Value Date   CHOL 176 10/17/2007   CHOL 168 02/28/2007   Lab Results  Component Value Date   HDL 34* 10/17/2007   HDL 31* 02/28/2007   Lab Results  Component Value Date   LDLCALC 111* 10/17/2007   LDLCALC 103* 02/28/2007   Lab Results  Component Value Date   TRIG 156* 10/17/2007   TRIG 171* 02/28/2007   Lab Results  Component Value Date   CHOLHDL 5.2 Ratio 10/17/2007   CHOLHDL 5.4 Ratio 02/28/2007   No  results found for this basename: LDLDIRECT      Radiology:  *RADIOLOGY REPORT*  Clinical Data: Chest pain. Shortness of breath. Sinus  bradycardia. History of hypertension and diabetes. Smoker.  PORTABLE CHEST - 1 VIEW 05/13/2011:  Comparison: Two-view chest x-ray 03/03/2009, 03/19/2007 [facility], and 07/25/2007 [facility].  Findings: External pacing device. Cardiac silhouette enlarged but  stable, allowing for differences in technique. Mild pulmonary  venous hypertension without overt edema. Lungs clear. No visible  pleural effusions.  IMPRESSION:  Stable cardiomegaly. No acute cardiopulmonary disease.  Original Report Authenticated By: Arnell Sieving, M.D.   EKG:  Sinus bradycardia with incomplete RBBB   ASSESSMENT:  1.  Chest pain with negative cardiac enzymes thus far and nonischemic EKG.  Symptoms somewhat atypical. 2.  HTN 3.  PAF on systemic anticoagulation 4.  Bipolar d/o 5.  COPD 6.  Dyslipidemia 7.  Obstructive sleep apnea 8.  Bradycardi 9.  Hypokalemia - repleated in ER  PLAN:   1.  Admit to tele bed 2.  Cycle cardiac enzymes 3.  ASA 325mg  daily 4.  GI cocktail now 5.  Gallbladder US - rule out cholelithiasis - WBC elevated but LFTs normal 6.  Stat PT/INR - if INR less than 2 then start IV Heparin per pharmacy protocol 7.  IV NTG gtt 8.  Hold Atenolol 9.  Hold Coumadin for now 10.  NPO after midnight 11.  Check BMET in am  Quintella Reichert, MD  05/13/2011  2:40 PM

## 2011-05-13 NOTE — ED Notes (Signed)
Pt noted to be 57 HR. EKG obtained

## 2011-05-14 ENCOUNTER — Inpatient Hospital Stay (HOSPITAL_COMMUNITY): Payer: Medicare Other

## 2011-05-14 DIAGNOSIS — I369 Nonrheumatic tricuspid valve disorder, unspecified: Secondary | ICD-10-CM

## 2011-05-14 DIAGNOSIS — R079 Chest pain, unspecified: Secondary | ICD-10-CM

## 2011-05-14 LAB — CBC
HCT: 35.1 % — ABNORMAL LOW (ref 39.0–52.0)
Hemoglobin: 11.5 g/dL — ABNORMAL LOW (ref 13.0–17.0)
MCH: 31.9 pg (ref 26.0–34.0)
MCHC: 32.8 g/dL (ref 30.0–36.0)
MCV: 97.5 fL (ref 78.0–100.0)
RBC: 3.6 MIL/uL — ABNORMAL LOW (ref 4.22–5.81)

## 2011-05-14 LAB — BASIC METABOLIC PANEL
BUN: 11 mg/dL (ref 6–23)
CO2: 33 mEq/L — ABNORMAL HIGH (ref 19–32)
Calcium: 9 mg/dL (ref 8.4–10.5)
Creatinine, Ser: 1.27 mg/dL (ref 0.50–1.35)
Glucose, Bld: 94 mg/dL (ref 70–99)

## 2011-05-14 LAB — GLUCOSE, CAPILLARY
Glucose-Capillary: 109 mg/dL — ABNORMAL HIGH (ref 70–99)
Glucose-Capillary: 99 mg/dL (ref 70–99)

## 2011-05-14 LAB — CARDIAC PANEL(CRET KIN+CKTOT+MB+TROPI)
CK, MB: 2.4 ng/mL (ref 0.3–4.0)
Total CK: 37 U/L (ref 7–232)

## 2011-05-14 LAB — HEMOGLOBIN A1C: Mean Plasma Glucose: 128 mg/dL — ABNORMAL HIGH (ref <117)

## 2011-05-14 MED ORDER — GI COCKTAIL ~~LOC~~
30.0000 mL | Freq: Three times a day (TID) | ORAL | Status: DC | PRN
  Administered 2011-05-14 – 2011-05-15 (×2): 30 mL via ORAL
  Filled 2011-05-14 (×2): qty 30

## 2011-05-14 MED ORDER — HEPARIN BOLUS VIA INFUSION
2000.0000 [IU] | Freq: Once | INTRAVENOUS | Status: AC
  Administered 2011-05-14: 2000 [IU] via INTRAVENOUS
  Filled 2011-05-14: qty 2000

## 2011-05-14 MED ORDER — SODIUM CHLORIDE 0.9 % IJ SOLN
3.0000 mL | Freq: Two times a day (BID) | INTRAMUSCULAR | Status: DC
  Administered 2011-05-14 – 2011-05-15 (×2): 3 mL via INTRAVENOUS

## 2011-05-14 MED ORDER — POTASSIUM CHLORIDE CRYS ER 20 MEQ PO TBCR
40.0000 meq | EXTENDED_RELEASE_TABLET | Freq: Once | ORAL | Status: AC
  Administered 2011-05-14: 40 meq via ORAL
  Filled 2011-05-14: qty 2

## 2011-05-14 MED ORDER — MAGNESIUM CITRATE PO SOLN
1.0000 | Freq: Once | ORAL | Status: AC
  Administered 2011-05-14: 1 via ORAL
  Filled 2011-05-14: qty 296

## 2011-05-14 MED ORDER — POLYETHYLENE GLYCOL 3350 17 G PO PACK
17.0000 g | PACK | Freq: Two times a day (BID) | ORAL | Status: DC
  Administered 2011-05-14: 17 g via ORAL
  Filled 2011-05-14 (×3): qty 1

## 2011-05-14 MED ORDER — WARFARIN SODIUM 6 MG PO TABS
6.0000 mg | ORAL_TABLET | Freq: Once | ORAL | Status: AC
  Administered 2011-05-14: 6 mg via ORAL
  Filled 2011-05-14: qty 1

## 2011-05-14 MED ORDER — HYDROCODONE-ACETAMINOPHEN 5-325 MG PO TABS
1.0000 | ORAL_TABLET | Freq: Four times a day (QID) | ORAL | Status: DC | PRN
  Administered 2011-05-14 (×2): 2 via ORAL
  Filled 2011-05-14 (×2): qty 2

## 2011-05-14 NOTE — Progress Notes (Signed)
Reviewed pt home Rx.  Atenolol held 2nd HR 40s. Glyburide and lisinopril not ordered since doses unknown and pt BP/CBGs are OK.  Lithium held because it can cause bradycardia.  Coumadin is per pharmacy and simvastatin as well as omeprazole had been ordered.  Added Hydrocodone as at home.

## 2011-05-14 NOTE — Progress Notes (Signed)
  Echocardiogram 2D Echocardiogram has been performed.  Ryn Peine Nira Retort 05/14/2011, 11:09 AM

## 2011-05-14 NOTE — Progress Notes (Signed)
CRITICAL VALUE ALERT  Critical value received:troponin 0.38  Date of notification: 05/13/11  Time of notification: 19:34  Critical value read back: yes  Nurse who received alert:  B. Evonnie Dawes MD notified (1st page):  Dr Maryelizabeth Kaufmann (he was at patient's bedside) Time of first page:  19:36   MD notified (2nd page):  Time of second page:  Responding MD:  Dr. Maryelizabeth Kaufmann  Time MD responded:  19:36

## 2011-05-14 NOTE — Progress Notes (Signed)
ANTICOAGULATION CONSULT NOTE - Follow Up Consult  Pharmacy Consult for coumadin Indication: atrial fibrillation  No Known Allergies  Patient Measurements: Height: 5\' 8"  (172.7 cm) Weight: 216 lb (97.977 kg) IBW/kg (Calculated) : 68.4   Vital Signs: Temp: 98 F (36.7 C) (02/25 0800) Temp src: Oral (02/25 0800) BP: 122/51 mmHg (02/25 0800) Pulse Rate: 46  (02/25 0800)  Labs:  Alvira Philips 05/14/11 0520 05/13/11 2154 05/13/11 1804 05/13/11 1758 05/13/11 1138  HGB 11.5* -- -- 13.2 --  HCT 35.1* -- -- 38.6* --  PLT 191 -- -- 226 --  APTT -- -- -- -- --  LABPROT -- -- -- 15.7* 14.7  INR -- -- -- 1.22 1.13  HEPARINUNFRC 0.23* <0.10* -- -- --  CREATININE 1.27 -- -- 1.04 1.19  CKTOTAL 37 47 46 -- --  CKMB 2.4 2.6 2.5 -- --  TROPONINI <0.30 0.51* 0.38* -- --   Estimated Creatinine Clearance: 72.8 ml/min (by C-G formula based on Cr of 1.27).   Medications:  Scheduled:    . sodium chloride   Intravenous Once  . aspirin  324 mg Oral Once  . aspirin EC  81 mg Oral Daily  . atropine  0.5 mg Intravenous Once  . atropine      . atropine      . gi cocktail  30 mL Oral Once  . heparin  2,000 Units Intravenous Once  . heparin  2,000 Units Intravenous Once  . heparin  4,000 Units Intravenous Once  . insulin aspart  0-15 Units Subcutaneous TID WC  .  morphine injection  4 mg Intravenous Once  .  morphine injection  4 mg Intravenous Once  . nitroGLYCERIN  5 mcg/min Intravenous Once  . ondansetron (ZOFRAN) IV  4 mg Intravenous Once  . ondansetron (ZOFRAN) IV  4 mg Intravenous Once  . pantoprazole  40 mg Oral Q1200  . polyethylene glycol  17 g Oral Daily  . potassium chloride  10 mEq Intravenous Once  . potassium chloride  40 mEq Oral Once  . potassium chloride  40 mEq Oral Once  . potassium chloride  40 mEq Oral Once  . simvastatin  40 mg Oral q1800  . DISCONTD: aspirin  324 mg Oral NOW  . DISCONTD: atropine  0.5 mg Intravenous Once    Assessment: 56 yo male with history of  afib on coumadin PTA. Now off heparin but to resume coumadin. Last INR was 1.2 and last coumadin dose on 2/22 (home dose 5mg  alternating with 2.5 mg). Patient may leave AMA.  Goal of Therapy:  INR 2-3   Plan:  -Will give coumadin 6mg  po today  Benny Lennert 05/14/2011,8:41 AM

## 2011-05-14 NOTE — Progress Notes (Signed)
SUBJECTIVE:  Denies pain.  Wants to leave AMA.  Says "it was gas."     PHYSICAL EXAM Filed Vitals:   05/14/11 0500 05/14/11 0600 05/14/11 0700 05/14/11 0800  BP: 120/54 134/57 125/52 122/51  Pulse: 49 45 45 46  Temp:    98 F (36.7 C)  TempSrc:    Oral  Resp: 17 17 20 20   Height:      Weight:      SpO2: 98% 99% 97% 96%   General:  No distress, agitated. Lungs:  No wheezing Heart:  RRR Abdomen:  Positive bowel sounds, no rebound no guarding Extremities:  No edema.  LABS: Lab Results  Component Value Date   CKTOTAL 37 05/14/2011   CKMB 2.4 05/14/2011   TROPONINI <0.30 05/14/2011   Results for orders placed during the hospital encounter of 05/13/11 (from the past 24 hour(s))  COMPREHENSIVE METABOLIC PANEL     Status: Abnormal   Collection Time   05/13/11 11:38 AM      Component Value Range   Sodium 142  135 - 145 (mEq/L)   Potassium 2.7 (*) 3.5 - 5.1 (mEq/L)   Chloride 100  96 - 112 (mEq/L)   CO2 31  19 - 32 (mEq/L)   Glucose, Bld 121 (*) 70 - 99 (mg/dL)   BUN 12  6 - 23 (mg/dL)   Creatinine, Ser 1.61  0.50 - 1.35 (mg/dL)   Calcium 9.7  8.4 - 09.6 (mg/dL)   Total Protein 6.6  6.0 - 8.3 (g/dL)   Albumin 3.4 (*) 3.5 - 5.2 (g/dL)   AST 10  0 - 37 (U/L)   ALT <5  0 - 53 (U/L)   Alkaline Phosphatase 75  39 - 117 (U/L)   Total Bilirubin 0.3  0.3 - 1.2 (mg/dL)   GFR calc non Af Amer 66 (*) >90 (mL/min)   GFR calc Af Amer 77 (*) >90 (mL/min)  D-DIMER, QUANTITATIVE     Status: Normal   Collection Time   05/13/11 11:38 AM      Component Value Range   D-Dimer, Quant 0.32  0.00 - 0.48 (ug/mL-FEU)  PROTIME-INR     Status: Normal   Collection Time   05/13/11 11:38 AM      Component Value Range   Prothrombin Time 14.7  11.6 - 15.2 (seconds)   INR 1.13  0.00 - 1.49   LITHIUM LEVEL     Status: Abnormal   Collection Time   05/13/11 11:39 AM      Component Value Range   Lithium Lvl <0.25 (*) 0.80 - 1.40 (mEq/L)  PRO B NATRIURETIC PEPTIDE     Status: Abnormal   Collection  Time   05/13/11 11:39 AM      Component Value Range   Pro B Natriuretic peptide (BNP) 1203.0 (*) 0 - 125 (pg/mL)  POCT I-STAT TROPONIN I     Status: Normal   Collection Time   05/13/11 11:44 AM      Component Value Range   Troponin i, poc 0.01  0.00 - 0.08 (ng/mL)   Comment 3           URINALYSIS, ROUTINE W REFLEX MICROSCOPIC     Status: Normal   Collection Time   05/13/11 12:12 PM      Component Value Range   Color, Urine YELLOW  YELLOW    APPearance CLEAR  CLEAR    Specific Gravity, Urine 1.009  1.005 - 1.030    pH 7.5  5.0 - 8.0    Glucose, UA NEGATIVE  NEGATIVE (mg/dL)   Hgb urine dipstick NEGATIVE  NEGATIVE    Bilirubin Urine NEGATIVE  NEGATIVE    Ketones, ur NEGATIVE  NEGATIVE (mg/dL)   Protein, ur NEGATIVE  NEGATIVE (mg/dL)   Urobilinogen, UA 0.2  0.0 - 1.0 (mg/dL)   Nitrite NEGATIVE  NEGATIVE    Leukocytes, UA NEGATIVE  NEGATIVE   GLUCOSE, CAPILLARY     Status: Normal   Collection Time   05/13/11  5:25 PM      Component Value Range   Glucose-Capillary 97  70 - 99 (mg/dL)  HEMOGLOBIN J1B     Status: Abnormal   Collection Time   05/13/11  5:58 PM      Component Value Range   Hemoglobin A1C 6.1 (*) <5.7 (%)   Mean Plasma Glucose 128 (*) <117 (mg/dL)  PROTIME-INR     Status: Abnormal   Collection Time   05/13/11  5:58 PM      Component Value Range   Prothrombin Time 15.7 (*) 11.6 - 15.2 (seconds)   INR 1.22  0.00 - 1.49   CBC     Status: Abnormal   Collection Time   05/13/11  5:58 PM      Component Value Range   WBC 6.0  4.0 - 10.5 (K/uL)   RBC 4.09 (*) 4.22 - 5.81 (MIL/uL)   Hemoglobin 13.2  13.0 - 17.0 (g/dL)   HCT 14.7 (*) 82.9 - 52.0 (%)   MCV 94.4  78.0 - 100.0 (fL)   MCH 32.3  26.0 - 34.0 (pg)   MCHC 34.2  30.0 - 36.0 (g/dL)   RDW 56.2  13.0 - 86.5 (%)   Platelets 226  150 - 400 (K/uL)  DIFFERENTIAL     Status: Normal   Collection Time   05/13/11  5:58 PM      Component Value Range   Neutrophils Relative 58  43 - 77 (%)   Neutro Abs 3.5  1.7 - 7.7 (K/uL)    Lymphocytes Relative 36  12 - 46 (%)   Lymphs Abs 2.2  0.7 - 4.0 (K/uL)   Monocytes Relative 4  3 - 12 (%)   Monocytes Absolute 0.3  0.1 - 1.0 (K/uL)   Eosinophils Relative 2  0 - 5 (%)   Eosinophils Absolute 0.1  0.0 - 0.7 (K/uL)   Basophils Relative 0  0 - 1 (%)   Basophils Absolute 0.0  0.0 - 0.1 (K/uL)  TSH     Status: Normal   Collection Time   05/13/11  5:58 PM      Component Value Range   TSH 0.845  0.350 - 4.500 (uIU/mL)  COMPREHENSIVE METABOLIC PANEL     Status: Abnormal   Collection Time   05/13/11  5:58 PM      Component Value Range   Sodium 144  135 - 145 (mEq/L)   Potassium 3.0 (*) 3.5 - 5.1 (mEq/L)   Chloride 105  96 - 112 (mEq/L)   CO2 28  19 - 32 (mEq/L)   Glucose, Bld 100 (*) 70 - 99 (mg/dL)   BUN 11  6 - 23 (mg/dL)   Creatinine, Ser 7.84  0.50 - 1.35 (mg/dL)   Calcium 9.4  8.4 - 69.6 (mg/dL)   Total Protein 6.3  6.0 - 8.3 (g/dL)   Albumin 3.2 (*) 3.5 - 5.2 (g/dL)   AST 11  0 - 37 (U/L)   ALT 5  0 -  53 (U/L)   Alkaline Phosphatase 71  39 - 117 (U/L)   Total Bilirubin 0.2 (*) 0.3 - 1.2 (mg/dL)   GFR calc non Af Amer 78 (*) >90 (mL/min)   GFR calc Af Amer >90  >90 (mL/min)  MAGNESIUM     Status: Normal   Collection Time   05/13/11  5:58 PM      Component Value Range   Magnesium 2.4  1.5 - 2.5 (mg/dL)  CARDIAC PANEL(CRET KIN+CKTOT+MB+TROPI)     Status: Abnormal   Collection Time   05/13/11  6:04 PM      Component Value Range   Total CK 46  7 - 232 (U/L)   CK, MB 2.5  0.3 - 4.0 (ng/mL)   Troponin I 0.38 (*) <0.30 (ng/mL)   Relative Index RELATIVE INDEX IS INVALID  0.0 - 2.5   MRSA PCR SCREENING     Status: Normal   Collection Time   05/13/11  6:12 PM      Component Value Range   MRSA by PCR NEGATIVE  NEGATIVE   GLUCOSE, CAPILLARY     Status: Normal   Collection Time   05/13/11  9:18 PM      Component Value Range   Glucose-Capillary 81  70 - 99 (mg/dL)  CARDIAC PANEL(CRET KIN+CKTOT+MB+TROPI)     Status: Abnormal   Collection Time   05/13/11  9:54 PM        Component Value Range   Total CK 47  7 - 232 (U/L)   CK, MB 2.6  0.3 - 4.0 (ng/mL)   Troponin I 0.51 (*) <0.30 (ng/mL)   Relative Index RELATIVE INDEX IS INVALID  0.0 - 2.5   HEPARIN LEVEL (UNFRACTIONATED)     Status: Abnormal   Collection Time   05/13/11  9:54 PM      Component Value Range   Heparin Unfractionated <0.10 (*) 0.30 - 0.70 (IU/mL)  CARDIAC PANEL(CRET KIN+CKTOT+MB+TROPI)     Status: Normal   Collection Time   05/14/11  5:20 AM      Component Value Range   Total CK 37  7 - 232 (U/L)   CK, MB 2.4  0.3 - 4.0 (ng/mL)   Troponin I <0.30  <0.30 (ng/mL)   Relative Index RELATIVE INDEX IS INVALID  0.0 - 2.5   HEPARIN LEVEL (UNFRACTIONATED)     Status: Abnormal   Collection Time   05/14/11  5:20 AM      Component Value Range   Heparin Unfractionated 0.23 (*) 0.30 - 0.70 (IU/mL)  CBC     Status: Abnormal   Collection Time   05/14/11  5:20 AM      Component Value Range   WBC 7.2  4.0 - 10.5 (K/uL)   RBC 3.60 (*) 4.22 - 5.81 (MIL/uL)   Hemoglobin 11.5 (*) 13.0 - 17.0 (g/dL)   HCT 96.0 (*) 45.4 - 52.0 (%)   MCV 97.5  78.0 - 100.0 (fL)   MCH 31.9  26.0 - 34.0 (pg)   MCHC 32.8  30.0 - 36.0 (g/dL)   RDW 09.8  11.9 - 14.7 (%)   Platelets 191  150 - 400 (K/uL)  BASIC METABOLIC PANEL     Status: Abnormal   Collection Time   05/14/11  5:20 AM      Component Value Range   Sodium 144  135 - 145 (mEq/L)   Potassium 3.1 (*) 3.5 - 5.1 (mEq/L)   Chloride 105  96 - 112 (mEq/L)  CO2 33 (*) 19 - 32 (mEq/L)   Glucose, Bld 94  70 - 99 (mg/dL)   BUN 11  6 - 23 (mg/dL)   Creatinine, Ser 1.61  0.50 - 1.35 (mg/dL)   Calcium 9.0  8.4 - 09.6 (mg/dL)   GFR calc non Af Amer 61 (*) >90 (mL/min)   GFR calc Af Amer 71 (*) >90 (mL/min)  GLUCOSE, CAPILLARY     Status: Abnormal   Collection Time   05/14/11  7:58 AM      Component Value Range   Glucose-Capillary 102 (*) 70 - 99 (mg/dL)    Intake/Output Summary (Last 24 hours) at 05/14/11 0803 Last data filed at 05/14/11 0700  Gross per 24  hour  Intake 3577.63 ml  Output    900 ml  Net 2677.63 ml    ASSESSMENT AND PLAN:  Principal Problem:  1)  Chest pain:  Etiology unclear.  Mild troponin elevation.  However, he says the pain is gone.  CKMB negative.    He wants to leave against medical advice.  He thinks the pain was gas.  GB ultrasound has been ordered.  EKG bradycardia without acute ST T wave changes.    2) Atrial fibrillation:  Sinus rhythm.  He says that he regulates his INR with a home machine.  I will restart warfarin with a pharmacy consult.    3) CHF (congestive heart failure):  Wants to go home.  I would like to see an echocardiogram.  He might consent to stay for this.   4)  HTN (hypertension):  Controlled   5)  DM:  Continue current therapy.      Fayrene Fearing [facility] 05/14/2011 8:03 AM

## 2011-05-15 ENCOUNTER — Other Ambulatory Visit (HOSPITAL_COMMUNITY): Payer: Self-pay | Admitting: Cardiology

## 2011-05-15 DIAGNOSIS — E876 Hypokalemia: Secondary | ICD-10-CM

## 2011-05-15 DIAGNOSIS — R079 Chest pain, unspecified: Secondary | ICD-10-CM

## 2011-05-15 LAB — BASIC METABOLIC PANEL
Chloride: 100 mEq/L (ref 96–112)
GFR calc Af Amer: 85 mL/min — ABNORMAL LOW (ref >90)
GFR calc non Af Amer: 74 mL/min — ABNORMAL LOW (ref >90)
Glucose, Bld: 123 mg/dL — ABNORMAL HIGH (ref 70–99)
Potassium: 3.3 mEq/L — ABNORMAL LOW (ref 3.5–5.1)
Sodium: 141 mEq/L (ref 135–145)

## 2011-05-15 MED ORDER — POTASSIUM CHLORIDE CRYS ER 20 MEQ PO TBCR: 40.0000 meq | EXTENDED_RELEASE_TABLET | ORAL | Status: DC

## 2011-05-15 MED ORDER — ASPIRIN 81 MG PO TBEC: 81.0000 mg | DELAYED_RELEASE_TABLET | Freq: Every day | ORAL | Status: DC

## 2011-05-15 MED ORDER — POTASSIUM CHLORIDE CRYS ER 20 MEQ PO TBCR: 20.0000 meq | EXTENDED_RELEASE_TABLET | ORAL | Status: DC

## 2011-05-15 MED ORDER — WARFARIN SODIUM 5 MG PO TABS: 5.0000 mg | ORAL_TABLET | Freq: Every day | ORAL | Status: DC

## 2011-05-15 MED ORDER — WARFARIN SODIUM 5 MG PO TABS: ORAL_TABLET | ORAL | Status: DC

## 2011-05-15 MED ORDER — WARFARIN SODIUM 2.5 MG PO TABS
2.5000 mg | ORAL_TABLET | Freq: Once | ORAL | Status: DC
  Filled 2011-05-15: qty 1

## 2011-05-15 MED ORDER — LOPERAMIDE HCL 2 MG PO CAPS
2.0000 mg | ORAL_CAPSULE | Freq: Once | ORAL | Status: AC
  Administered 2011-05-15: 2 mg via ORAL
  Filled 2011-05-15: qty 1

## 2011-05-15 MED ORDER — NITROGLYCERIN 0.4 MG SL SUBL: 0.4000 mg | SUBLINGUAL_TABLET | SUBLINGUAL | Status: DC | PRN

## 2011-05-15 NOTE — Discharge Instructions (Signed)
***  PLEASE REMEMBER TO BRING ALL OF YOUR MEDICATIONS TO EACH OF YOUR FOLLOW-UP OFFICE VISITS.  Continue to follow your INR's closely with a goal INR of 2.0 - 3.0.

## 2011-05-15 NOTE — Discharge Summary (Signed)
Patient seen and examined.  Plan as discussed in my rounding note for today and outlined above. Sean Warren  05/15/2011  2:43 PM

## 2011-05-15 NOTE — Progress Notes (Signed)
ANTICOAGULATION CONSULT NOTE - Follow Up Consult  Pharmacy Consult for coumadin Indication: atrial fibrillation  No Known Allergies  Patient Measurements: Height: 5\' 8"  (172.7 cm) Weight: 216 lb (97.977 kg) IBW/kg (Calculated) : 68.4   Vital Signs: Temp: 97.9 F (36.6 C) (02/26 0829) Temp src: Oral (02/26 0829) BP: 153/79 mmHg (02/26 0829) Pulse Rate: 91  (02/26 0829)  Labs:  Alvira Philips 05/15/11 0452 05/14/11 0520 05/13/11 2154 05/13/11 1804 05/13/11 1758 05/13/11 1138  HGB -- 11.5* -- -- 13.2 --  HCT -- 35.1* -- -- 38.6* --  PLT -- 191 -- -- 226 --  APTT -- -- -- -- -- --  LABPROT 19.7* -- -- -- 15.7* 14.7  INR 1.64* -- -- -- 1.22 1.13  HEPARINUNFRC -- 0.23* <0.10* -- -- --  CREATININE -- 1.27 -- -- 1.04 1.19  CKTOTAL -- 37 47 46 -- --  CKMB -- 2.4 2.6 2.5 -- --  TROPONINI -- <0.30 0.51* 0.38* -- --   Estimated Creatinine Clearance: 72.8 ml/min (by C-G formula based on Cr of 1.27).   Medications:  Scheduled:     . aspirin EC  81 mg Oral Daily  . insulin aspart  0-15 Units Subcutaneous TID WC  . magnesium citrate  1 Bottle Oral Once  . pantoprazole  40 mg Oral Q1200  . polyethylene glycol  17 g Oral BID  . potassium chloride  40 mEq Oral Once  . simvastatin  40 mg Oral q1800  . sodium chloride  3 mL Intravenous Q12H  . warfarin  6 mg Oral ONCE-1800  . DISCONTD: sodium chloride   Intravenous Once  . DISCONTD: polyethylene glycol  17 g Oral Daily    Assessment: 56 yo male with history of afib on coumadin PTA. Now off heparin but to resume coumadin. INR up to 1.64 from 1.22. Home dose 5mg  alternating with 2.5 mg. Likely for d/c today.  Goal of Therapy:  INR 2-3   Plan:  -Will give coumadin 2.5mg  po today  Benny Lennert 05/15/2011,8:42 AM

## 2011-05-15 NOTE — Discharge Summary (Signed)
Patient ID: Sean Warren,  MRN: [id number], DOB/AGE: [birth year] 58 y.o.  Admit date: 05/13/2011 Discharge date: 05/15/2011  Primary Cardiologist: J. Hochrein  Discharge Diagnoses Principal Problem:  *Chest pain Active Problems:  Bipolar disorder  Obstructive sleep apnea  COPD (chronic obstructive pulmonary disease)  Tobacco abuse  CVA (cerebral infarction)  DM (diabetes mellitus)  Atrial fibrillation  CHF (congestive heart failure)  HTN (hypertension)  Dyslipidemia  Hypokalemia   Allergies No Known Allergies  Procedures  05/14/2011 - Abd Ultrasound  IMPRESSION:  1.  Normal gallbladder and normal caliber common bile duct. 2.  Normal sonographic appearance of the liver and pancreas. 3.  Splenomegaly and splenic cyst or hemangioma. 4.  Normal kidneys except for a small left renal cyst. _______________________  05/14/2011 - 2D Echocardiogram  Study Conclusions  Left ventricle: The cavity size was normal. Wall thickness was increased in a pattern of mild LVH. Systolic function was normal. The estimated ejection fraction was in the range of 50% to 55%. Wall motion was normal; there were no regional wall motion abnormalities. Features are consistent with a pseudonormal left ventricular filling pattern, with concomitant abnormal relaxation and increased filling pressure (grade 2 diastolic dysfunction). __________________________________  History of Present Illness  56 year old male with the above complex problem list. He was in his usual state of health until the night prior to admission when he began to experience sensation of something lodged in the center of his chest. Symptoms persisted throughout the night, limiting sleep. Symptoms remained on the morning of admission and the patient presented to [facility] ED where he rated his discomfort as a 10/10. He was treated with sublingual nitroglycerin without significant relief. Symptoms were associated with belching. ECG  was nonacute and first set of cardiac markers were negative. Exam was unrevealing. He was admitted for further evaluation.  Hospital Course  Following admission, patient continued to have substernal chest discomfort. He did have minimal troponin rise to 0.51 though his CK, and MB, remained negative. His d-dimer was normal. Patient was hypokalemic on admission with potassium of 2.7 and this was repleted. Patient complained of epigastric and left upper quadrant tenderness. Abdominal plain films were performed and were without acute findings. Abdominal ultrasound was also undertaken and was normal.  Patient felt symptoms were related to gas and wished to leave AMA on February 25. Situation was discussed and patient was willing to stay for 2-D echocardiogram to evaluate left ventricular function. Echocardiogram performed Feb 25th, showed normal LV function without wall motion abnormalities. It was notable for grade 2 diastolic dysfunction.  This morning, patient is feeling better. He was treated with laxatives over the weekend for complaints of constipation and gas, and did have multiple bowel movements Feb 25. We plan to discharge him home today in good condition. In light of normal LV function, we will pursue outpatient stress testing rather than an invasive strategy. Of note, patient had intermittent bradycardia as well as rate control of atrial fibrillation while hospitalized. We will obtain a 24-hour Holter monitor to assess for brady-arrhythmias at home. Patient is on chronic Coumadin anticoagulation which she monitors at home using a point-of-care machine.    Discharge Vitals Blood pressure 153/79, pulse 91, temperature 97.9 F (36.6 C), temperature source Oral, resp. rate 16, height 5\' 8"  (1.727 m), weight 216 lb (97.977 kg), SpO2 96.00%.  Filed Weights   05/13/11 1530  Weight: 216 lb (97.977 kg)    Labs  CBC  Basename 05/14/11 0520 05/13/11 1758  WBC 7.2 6.0  NEUTROABS -- 3.5  HGB  11.5* 13.2  HCT 35.1* 38.6*  MCV 97.5 94.4  PLT 191 226   Basic Metabolic Panel  Basename 05/15/11 0806 05/14/11 0520 05/13/11 1758  NA 141 144 --  K 3.3* 3.1* --  CL 100 105 --  CO2 33* 33* --  GLUCOSE 123* 94 --  BUN 7 11 --  CREATININE 1.09 1.27 --  CALCIUM 9.6 9.0 --  MG -- -- 2.4  PHOS -- -- --   Liver Function Tests  Basename 05/13/11 1758 05/13/11 1138  AST 11 10  ALT 5 <5  ALKPHOS 71 75  BILITOT 0.2* 0.3  PROT 6.3 6.6  ALBUMIN 3.2* 3.4*   Cardiac Enzymes  Basename 05/14/11 0520 05/13/11 2154 05/13/11 1804  CKTOTAL 37 47 46  CKMB 2.4 2.6 2.5  CKMBINDEX -- -- --  TROPONINI <0.30 0.51* 0.38*   D-Dimer  Basename 05/13/11 1138  DDIMER 0.32   Hemoglobin A1C  Basename 05/13/11 1758  HGBA1C 6.1*   Thyroid Function Tests  Basename 05/13/11 1758  TSH 0.845  T4TOTAL --  T3FREE --  THYROIDAB --    Disposition  Pt is being discharged home today in good condition.  Follow-up Plans & Appointments  Follow-up Information    Follow up with Tereso Newcomer, PA on 05/22/2011. (9:50 AM)    Contact information:   [address] [place] [address] [phone]       Follow up with [facility] on 05/16/2011. (9:30 AM - Pharmacologic Stress Test - nothing to eat after midnight, no caffeine on the day before)    Contact information:   [address] [place] [phone]      Follow up with [employer]. (24 hr Holter Monitor to assess rate control of Atrial Fibrillation - we will arrange and contact you)    Contact information:   [address] [place] [phone]         Discharge Medications  Medication List  As of 05/15/2011  9:41 AM   STOP taking these medications         ATENOLOL PO         TAKE these medications         aspirin 81 MG EC tablet   Take 1 tablet (81 mg total) by mouth daily.      glimepiride 2 MG tablet   Commonly known as: AMARYL   Take 2 mg by mouth daily before  breakfast. Take 1/2 tablet daily      HYDROcodone-acetaminophen 5-500 MG per tablet   Commonly known as: VICODIN   Take 1-2 tablets by mouth every 6 (six) hours as needed for pain.      lisinopril 40 MG tablet   Commonly known as: PRINIVIL,ZESTRIL   Take 40 mg by mouth daily.      lithium carbonate 300 MG CR tablet   Commonly known as: LITHOBID   Take 300 mg by mouth 2 (two) times daily.      nitroGLYCERIN 0.4 MG SL tablet   Commonly known as: NITROSTAT   Place 1 tablet (0.4 mg total) under the tongue every 5 (five) minutes x 3 doses as needed for chest pain.      omeprazole 20 MG capsule   Commonly known as: PRILOSEC   Take 20 mg by mouth daily.      potassium chloride SA 20 MEQ tablet   Commonly known as: K-DUR,KLOR-CON   Take 1  tablet (20 mEq total) by mouth now.      simvastatin 40 MG tablet   Commonly known as: ZOCOR   Take 40 mg by mouth every evening.      warfarin 5 MG tablet   Commonly known as: COUMADIN   Take 5mg  alternating with 2.5mg  every other day as previously prescribed.            Outstanding Labs/Studies  Lexiscan myoview & 24 hr Holter are pending.  Pt will need a BMET @ the time of clinic follow-up.  Duration of Discharge Encounter   Greater than 30 minutes including physician time.  Signed, Nicolasa Ducking NP 05/15/2011, 9:41 AM

## 2011-05-15 NOTE — Progress Notes (Signed)
Pt dc per protocol

## 2011-05-15 NOTE — Progress Notes (Signed)
SUBJECTIVE:  Frequent BMs yesterday after laxatives.     PHYSICAL EXAM Filed Vitals:   05/14/11 1629 05/14/11 2000 05/15/11 0000 05/15/11 0400  BP: 159/64 134/89 138/47 121/59  Pulse: 50     Temp: 97.8 F (36.6 C) 98.2 F (36.8 C) 98.3 F (36.8 C) 97.8 F (36.6 C)  TempSrc: Oral Oral Oral Oral  Resp:  20 18 16   Height:      Weight:      SpO2: 97% 96% 99% 98%   General:  No distress, agitated. Lungs:  No wheezing Heart:  RRR Abdomen:  Positive bowel sounds, no rebound no guarding Extremities:  No edema.  LABS: Lab Results  Component Value Date   CKTOTAL 37 05/14/2011   CKMB 2.4 05/14/2011   TROPONINI <0.30 05/14/2011   Results for orders placed during the hospital encounter of 05/13/11 (from the past 24 hour(s))  GLUCOSE, CAPILLARY     Status: Abnormal   Collection Time   05/14/11  7:58 AM      Component Value Range   Glucose-Capillary 102 (*) 70 - 99 (mg/dL)  GLUCOSE, CAPILLARY     Status: Abnormal   Collection Time   05/14/11 11:53 AM      Component Value Range   Glucose-Capillary 109 (*) 70 - 99 (mg/dL)  GLUCOSE, CAPILLARY     Status: Normal   Collection Time   05/14/11  5:25 PM      Component Value Range   Glucose-Capillary 99  70 - 99 (mg/dL)  GLUCOSE, CAPILLARY     Status: Normal   Collection Time   05/14/11  9:16 PM      Component Value Range   Glucose-Capillary 82  70 - 99 (mg/dL)  PROTIME-INR     Status: Abnormal   Collection Time   05/15/11  4:52 AM      Component Value Range   Prothrombin Time 19.7 (*) 11.6 - 15.2 (seconds)   INR 1.64 (*) 0.00 - 1.49     Intake/Output Summary (Last 24 hours) at 05/15/11 0654 Last data filed at 05/15/11 0600  Gross per 24 hour  Intake 3201.5 ml  Output   2111 ml  Net 1090.5 ml   GB Ultrasound: 1. Normal gallbladder and normal caliber common bile duct.  2. Normal sonographic appearance of the liver and pancreas.  3. Splenomegaly and splenic cyst or hemangioma.  4. Normal kidneys except for a small left  renal cyst.   Echocardiogram:    NL EF.  No significant valvular abnormalities  ASSESSMENT AND PLAN:    1)  Chest pain:  Etiology unclear.  Mild troponin elevation.  However CKMB negative.  Echocardiogram as above.  No objective evidence of ischemia.  He should have an outpatient stress perfusion study very mild troponin elevation and long standing diabetes.  However, pain went away as constipation was treated.    2) Atrial fibrillation:  Sinus rhythm/bradycardia and intermittent fibrillation.  He says that he regulates his INR with a home machine.  Warfarin restarted with a  pharmacy consult.  Rate is not elevated with fib.  No change in therapy.  No atenolol at discharge.  He will need an outpatient Holter at the next appt.    3) CHF (congestive heart failure):  Seems to be euvolemic.     4)  HTN (hypertension):  Controlled   5)  DM:  Continue current therapy.   6)  Bipolar:  He needs to restart the previous meds he was taking.  We need to confirm/document home meds.  OK to discharge.  Needs follow up with PA/NP in the office within 7 days.  Need to see results of today's BMET before discharge.      Fayrene Fearing [facility] 05/15/2011 6:54 AM

## 2011-05-16 ENCOUNTER — Ambulatory Visit (HOSPITAL_COMMUNITY): Payer: Medicare Other | Attending: Cardiology | Admitting: Radiology

## 2011-05-16 ENCOUNTER — Encounter (INDEPENDENT_AMBULATORY_CARE_PROVIDER_SITE_OTHER): Payer: Medicare Other

## 2011-05-16 VITALS — BP 174/81 | Ht 68.0 in | Wt 214.0 lb

## 2011-05-16 DIAGNOSIS — E785 Hyperlipidemia, unspecified: Secondary | ICD-10-CM | POA: Insufficient documentation

## 2011-05-16 DIAGNOSIS — Z794 Long term (current) use of insulin: Secondary | ICD-10-CM | POA: Insufficient documentation

## 2011-05-16 DIAGNOSIS — I10 Essential (primary) hypertension: Secondary | ICD-10-CM | POA: Insufficient documentation

## 2011-05-16 DIAGNOSIS — R0989 Other specified symptoms and signs involving the circulatory and respiratory systems: Secondary | ICD-10-CM | POA: Insufficient documentation

## 2011-05-16 DIAGNOSIS — F17200 Nicotine dependence, unspecified, uncomplicated: Secondary | ICD-10-CM | POA: Insufficient documentation

## 2011-05-16 DIAGNOSIS — R112 Nausea with vomiting, unspecified: Secondary | ICD-10-CM | POA: Insufficient documentation

## 2011-05-16 DIAGNOSIS — J4489 Other specified chronic obstructive pulmonary disease: Secondary | ICD-10-CM | POA: Insufficient documentation

## 2011-05-16 DIAGNOSIS — R61 Generalized hyperhidrosis: Secondary | ICD-10-CM | POA: Insufficient documentation

## 2011-05-16 DIAGNOSIS — E119 Type 2 diabetes mellitus without complications: Secondary | ICD-10-CM | POA: Insufficient documentation

## 2011-05-16 DIAGNOSIS — R0609 Other forms of dyspnea: Secondary | ICD-10-CM | POA: Insufficient documentation

## 2011-05-16 DIAGNOSIS — Z8679 Personal history of other diseases of the circulatory system: Secondary | ICD-10-CM | POA: Insufficient documentation

## 2011-05-16 DIAGNOSIS — I509 Heart failure, unspecified: Secondary | ICD-10-CM | POA: Insufficient documentation

## 2011-05-16 DIAGNOSIS — I4891 Unspecified atrial fibrillation: Secondary | ICD-10-CM

## 2011-05-16 DIAGNOSIS — I4510 Unspecified right bundle-branch block: Secondary | ICD-10-CM | POA: Insufficient documentation

## 2011-05-16 DIAGNOSIS — R079 Chest pain, unspecified: Secondary | ICD-10-CM | POA: Insufficient documentation

## 2011-05-16 DIAGNOSIS — R0602 Shortness of breath: Secondary | ICD-10-CM

## 2011-05-16 DIAGNOSIS — J449 Chronic obstructive pulmonary disease, unspecified: Secondary | ICD-10-CM | POA: Insufficient documentation

## 2011-05-16 MED ORDER — TECHNETIUM TC 99M TETROFOSMIN IV KIT
10.0000 | PACK | Freq: Once | INTRAVENOUS | Status: AC | PRN
  Administered 2011-05-16: 10 via INTRAVENOUS

## 2011-05-16 MED ORDER — TECHNETIUM TC 99M TETROFOSMIN IV KIT
30.0000 | PACK | Freq: Once | INTRAVENOUS | Status: AC | PRN
  Administered 2011-05-16: 30 via INTRAVENOUS

## 2011-05-16 MED ORDER — REGADENOSON 0.4 MG/5ML IV SOLN
0.4000 mg | Freq: Once | INTRAVENOUS | Status: AC
  Administered 2011-05-16: 0.4 mg via INTRAVENOUS

## 2011-05-16 NOTE — Progress Notes (Signed)
[facility] SITE 3 NUCLEAR MED [address] [place] [address] [phone]  Cardiology Nuclear Med Study  Sean Warren is a 56 y.o. male [id number] May 02, 1955   Nuclear Med Background Indication for Stress Test:  Evaluation for Ischemia and 05/14/11 Post Hospital: Chest Pain, mildly elevated troponins History:  >14yrs ago Cath(PA):OK per patient; 05/14/11 Echo:EF=50-55%, mild LVH; COPD, CHF Cardiac Risk Factors: CVA, Hypertension, IDDM Type 2, Lipids, RBBB and Smoker  Symptoms:  Chest Pain (last date of chest discomfort none since discharge), Diaphoresis, DOE, Nausea and Vomiting   Nuclear Pre-Procedure Caffeine/Decaff Intake:  None NPO After: 7:30pm   Lungs:  Clear.  O2 SAT 96% IV 0.9% NS with Angio Cath:  20g  IV Site: R Antecubital  IV Started by:  Stanton Kidney, EMT-P  Chest Size (in):  46 Cup Size: n/a  Height: 5\' 8"  (1.727 m)  Weight:  214 lb (97.07 kg)  BMI:  Body mass index is 32.54 kg/(m^2). Tech Comments:  NA    Nuclear Med Study 1 or 2 day study: 1 day  Stress Test Type:  Lexiscan  Reading MD: Olga Millers, MD  Order Authorizing Provider:  Rollene Rotunda, MD  Resting Radionuclide: Technetium 105m Tetrofosmin  Resting Radionuclide Dose: 11.0 mCi   Stress Radionuclide:  Technetium 34m Tetrofosmin  Stress Radionuclide Dose: 32.9 mCi           Stress Protocol Rest HR: 57 Stress HR: 85  Rest BP: 174/81 Stress BP: 174/81  Exercise Time (min): n/a METS: n/a   Predicted Max HR: 163 bpm % Max HR: 52.15 bpm Rate Pressure Product: 21308   Dose of Adenosine (mg):  n/a Dose of Lexiscan: 0.4 mg  Dose of Atropine (mg): n/a Dose of Dobutamine: n/a mcg/kg/min (at max HR)  Stress Test Technologist: Smiley Houseman, CMA-N  Nuclear Technologist:  Domenic Polite, CNMT     Rest Procedure:  Myocardial perfusion imaging was performed at rest 45 minutes following the intravenous administration of Technetium 1m Tetrofosmin.  Rest ECG: IRBBB.  Stress  Procedure:  The patient received IV Lexiscan 0.4 mg over 15-seconds.  Technetium 32m Tetrofosmin injected at 30-seconds.  There were no significant changes with Lexiscan.  Quantitative spect images were obtained after a 45 minute delay.  Stress ECG: No significant ST segment change suggestive of ischemia.  QPS Raw Data Images:  Acquisition technically good; mild LVE. Stress Images:  There is decreased uptake in the inferoseptal wall. Rest Images:  There is decreased uptake in the inferoseptal wall. Subtraction (SDS):  No evidence of ischemia. Transient Ischemic Dilatation (Normal <1.22):  1.05 Lung/Heart Ratio (Normal <0.45):  0.34  Quantitative Gated Spect Images QGS EDV:  149 ml QGS ESV:  58 ml QGS cine images:  NL LV Function; NL Wall Motion QGS EF: 61%  Impression Exercise Capacity:  Lexiscan with no exercise. BP Response:  Normal blood pressure response. Clinical Symptoms:  No chest pain. ECG Impression:  No significant ST segment change suggestive of ischemia. Comparison with Prior Nuclear Study: No images to compare  Overall Impression:  Low risk stress nuclear study with a small fixed inferoseptal defect consistent with thinning vs prior infarct; no ischemia.  Olga Millers

## 2011-05-22 ENCOUNTER — Encounter: Payer: Medicare Other | Admitting: Physician Assistant

## 2011-05-23 ENCOUNTER — Telehealth: Payer: Self-pay | Admitting: Physician Assistant

## 2011-05-23 NOTE — Telephone Encounter (Signed)
Per Marcos Eke, the report is with Dr. Antoine Poche for interpretation.  Advised pt to keep appt tomorrow with Tereso Newcomer.

## 2011-05-23 NOTE — Telephone Encounter (Signed)
Pt had appt today that he missed, rs to tomorrow am ,but doesn't want to come in if his monitor results aren't back, wants to see if they are in and he will keep his appt tomorrow, if not he will rs, pls call

## 2011-05-24 ENCOUNTER — Encounter: Payer: Medicare Other | Admitting: Physician Assistant

## 2011-05-30 ENCOUNTER — Encounter: Payer: Self-pay | Admitting: Physician Assistant

## 2011-05-30 ENCOUNTER — Ambulatory Visit (INDEPENDENT_AMBULATORY_CARE_PROVIDER_SITE_OTHER): Payer: Medicare Other | Admitting: Physician Assistant

## 2011-05-30 VITALS — BP 116/78 | HR 79 | Ht 68.0 in | Wt 225.0 lb

## 2011-05-30 DIAGNOSIS — I4891 Unspecified atrial fibrillation: Secondary | ICD-10-CM

## 2011-05-30 DIAGNOSIS — R0609 Other forms of dyspnea: Secondary | ICD-10-CM

## 2011-05-30 DIAGNOSIS — G4733 Obstructive sleep apnea (adult) (pediatric): Secondary | ICD-10-CM

## 2011-05-30 DIAGNOSIS — Z720 Tobacco use: Secondary | ICD-10-CM

## 2011-05-30 DIAGNOSIS — I509 Heart failure, unspecified: Secondary | ICD-10-CM

## 2011-05-30 DIAGNOSIS — I5032 Chronic diastolic (congestive) heart failure: Secondary | ICD-10-CM

## 2011-05-30 DIAGNOSIS — F17200 Nicotine dependence, unspecified, uncomplicated: Secondary | ICD-10-CM

## 2011-05-30 DIAGNOSIS — J449 Chronic obstructive pulmonary disease, unspecified: Secondary | ICD-10-CM

## 2011-05-30 DIAGNOSIS — I10 Essential (primary) hypertension: Secondary | ICD-10-CM

## 2011-05-30 DIAGNOSIS — R079 Chest pain, unspecified: Secondary | ICD-10-CM

## 2011-05-30 LAB — BASIC METABOLIC PANEL
Chloride: 105 mEq/L (ref 96–112)
GFR: 67.45 mL/min (ref >60.00)
Glucose, Bld: 89 mg/dL (ref 70–99)
Potassium: 4.3 mEq/L (ref 3.5–5.1)
Sodium: 142 mEq/L (ref 135–145)

## 2011-05-30 LAB — BRAIN NATRIURETIC PEPTIDE: Pro B Natriuretic peptide (BNP): 339 pg/mL — ABNORMAL HIGH (ref 0.0–100.0)

## 2011-05-30 NOTE — Progress Notes (Signed)
[address] [place], [place]  [address] Phone: [phone] Fax:  [phone]  Date:  05/30/2011   Name:  Sean Warren       DOB:  10-Jul-1955 MRN:  [id number]  PCP:  Dr. Mikeal Hawthorne Primary Cardiologist:  Dr. Rollene Rotunda  Primary Electrophysiologist:  None    History of Present Illness: Sean Warren is a 56 y.o. male who presents for post hospital follow up.  He was previously followed by Dr. Reyes Ivan.  He has a history of diabetes, hypertension, hyperlipidemia, atrial fibrillation, CHF, prior stroke, COPD, sleep apnea, bipolar disorder.  He was admitted 2/24-2/26 with chest discomfort.  He had associated belching.  Troponins were minimally elevated but his CK-MB's were negative.  Abdominal ultrasound was normal.  Echocardiogram 05/14/11: Mild LVH, EF 50-55%, no wall motion abnormalities, grade 2 diastolic dysfunction.  He had some bradycardia noted on his monitor in the setting of persistent atrial fibrillation.  He was discharged home and set up for outpatient Myoview as well as a Holter monitor to assess for bradyarrhythmias.  Labs: Hemoglobin 11.5, potassium 3.3, creatinine 1.09, ALT 5, troponin (less than 0.3, 0.51, 0.38), TSH 0.845, d-dimer 0.32, hemoglobin A1c 6.1.  Chest x-ray unremarkable.  Myoview 05/17/11 demonstrated an EF of 61%, small fixed inferoseptal defect consistent with thinning vs prior infarct; no ischemia.  This was reviewed by Dr. Antoine Poche felt to be a low-risk study.  No further workup was recommended.  He is stable.  He has chronic, class III dyspnea with exertion.  He sleeps on one pillow.  He sleeps with CPAP.  He has chronic lower extremity edema.  This may be worse recently.  He does not weigh himself daily.  He's had 2 episodes of chest discomfort.  He points to his left chest.  It is sharp.  He takes nitroglycerin with relief.  He denies exertional chest pain.  He did some nauseated.  He denies diaphoresis.  He denies syncope or  palpitations.  He has significant limitations related to diabetic neuropathy.   Past Medical History  Diagnosis Date  . Hypertension   . Diabetes mellitus   . Bipolar 1 disorder   . Hyperlipidemia   . Atrial fibrillation   . COPD (chronic obstructive pulmonary disease)   . Obstructive sleep apnea   . Chronic diastolic heart failure     Echocardiogram 05/14/11: Mild LVH, EF 50-55%, no wall motion abnormalities, grade 2 diastolic dysfunction.  . Chest pain     Myoview 05/17/11 demonstrated an EF of 61%, small fixed inferoseptal defect consistent with thinning vs prior infarct; no ischemia. - low risk  . Diabetic neuropathy   . Chronic back pain   . History of stroke     Current Outpatient Prescriptions  Medication Sig Dispense Refill  . aspirin EC 81 MG EC tablet Take 1 tablet (81 mg total) by mouth daily.      . busPIRone (BUSPAR) 7.5 MG tablet Take 7.5 mg by mouth daily.       . cloNIDine (CATAPRES) 0.2 MG tablet Take 0.2 mg by mouth 2 (two) times daily.       . diazepam (VALIUM) 10 MG tablet Take 10 mg by mouth daily.       Marland Kitchen gabapentin (NEURONTIN) 600 MG tablet Take 600 mg by mouth 2 (two) times daily.       Marland Kitchen glimepiride (AMARYL) 2 MG tablet Take 2 mg by mouth daily before breakfast. Take 1/2 tablet daily      .  hydrochlorothiazide (HYDRODIURIL) 25 MG tablet Take 25 mg by mouth daily.       Marland Kitchen HYDROcodone-acetaminophen (VICODIN) 5-500 MG per tablet Take 1 tablet by mouth every 6 (six) hours as needed.       Marland Kitchen lisinopril (PRINIVIL,ZESTRIL) 40 MG tablet Take 40 mg by mouth daily.      Marland Kitchen lithium carbonate (LITHOBID) 300 MG CR tablet Take 300 mg by mouth 2 (two) times daily.      . mupirocin ointment (BACTROBAN) 2 % Apply 1 application topically as needed.       . nitroGLYCERIN (NITROSTAT) 0.4 MG SL tablet Place 1 tablet (0.4 mg total) under the tongue every 5 (five) minutes x 3 doses as needed for chest pain.  25 tablet  3  . omeprazole (PRILOSEC) 20 MG capsule Take 20 mg by mouth  daily.      . potassium chloride SA (K-DUR,KLOR-CON) 20 MEQ tablet Take 1 tablet (20 mEq total) by mouth now.  30 tablet  3  . propranolol (INDERAL LA) 60 MG 24 hr capsule Take 60 mg by mouth daily. ( UNKNOWN PRESCRIPTION )      . simvastatin (ZOCOR) 40 MG tablet Take 40 mg by mouth every evening.      . warfarin (COUMADIN) 5 MG tablet Take 5mg  alternating with 2.5mg  every other day as previously prescribed.  30 tablet  2    Allergies: Allergies  Allergen Reactions  . Muscle Relief (Capsaicin Hp)     Hysteria     History  Substance Use Topics  . Smoking status: Current Everyday Smoker -- 1.5 packs/day for 29 years    Types: Cigarettes  . Smokeless tobacco: Not on file  . Alcohol Use: No     ROS:  Please see the history of present illness.    All other systems reviewed and negative.   PHYSICAL EXAM: VS:  BP 116/78  Pulse 79  Ht 5\' 8"  (1.727 m)  Wt 225 lb (102.059 kg)  BMI 34.21 kg/m2 Well nourished, well developed, in no acute distress HEENT: normal Neck: JVP minimally elevated Cardiac:  normal S1, S2; Irregularly irregular; no murmur Lungs:  Decreased Breath sounds bilaterally, no wheezing, rhonchi or rales Abd: soft, nontender, no hepatomegaly Ext: Trace-1+ bilateral edema Skin: warm and dry Neuro:  CNs 2-12 intact, no focal abnormalities noted  EKG:  Atrial fibrillation, heart rate 79, normal axis, nonspecific ST-T wave changes  ASSESSMENT AND PLAN:  1. Atrial fibrillation  Rate well controlled.  Holter monitor is still to be reviewed by Dr. Antoine Poche.  I did pull the report.  His average heart rate is 89.  His heart rates range from 47-145.  Overall, I believe his heart rate is well controlled.  Continue current therapy.  He checks his INR at home.  His PCP manages his dose.   2. Chest pain  Atypical.  Recent low risk Myoview.  Continue current therapy.  If his chest symptoms become more frequent or more suggestive of angina, we could consider cardiac  catheterization.   3. Chronic diastolic heart failure  I suspect he is chronically volume overloaded.  He is on HCTZ.  I believe that a lot of his shortness of breath is multifactorial and related to debility from his peripheral neuropathy, chronic diastolic heart failure and COPD with ongoing tobacco abuse.  I will check a basic metabolic panel and BNP.  If his BNP is significantly elevated, I will stop his HCTZ and place him on Lasix with close followup  on his renal function and potassium.  Otherwise, followup with Dr. Antoine Poche in 3 months or sooner as needed.   4. HTN (hypertension)  Controlled.  Continue current therapy.    5. COPD (chronic obstructive pulmonary disease)  He knows he needs to quit smoking.  Follow up with PCP.   6. Tobacco abuse  As noted, he knows he needs to quit.   7. Obstructive sleep apnea  Wearing CPAP.     Luna Glasgow, PA-C  12:38 PM 05/30/2011

## 2011-05-30 NOTE — Patient Instructions (Signed)
Your physician recommends that you schedule a follow-up appointment in: 3 MONTHS WITH DR. [facility]  Your physician recommends that you return for lab work in: TODAY BMET, BNP

## 2011-05-31 ENCOUNTER — Telehealth: Payer: Self-pay | Admitting: Physician Assistant

## 2011-05-31 NOTE — Telephone Encounter (Signed)
Pt is aware of labs and his med questions was answered

## 2011-05-31 NOTE — Telephone Encounter (Signed)
LMOM TCB -- spoke with Lorin Picket to verify about meds first and he has it on the labs about what to do.

## 2011-05-31 NOTE — Telephone Encounter (Signed)
New msg Pt was here yesterday and wanted to talk to you about his meds.

## 2011-05-31 NOTE — Telephone Encounter (Signed)
Fu call °Patient returning your call °

## 2011-06-05 ENCOUNTER — Telehealth: Payer: Self-pay | Admitting: Physician Assistant

## 2011-06-05 MED ORDER — FUROSEMIDE 40 MG PO TABS: 40.0000 mg | ORAL_TABLET | Freq: Every day | ORAL | Status: DC

## 2011-06-05 NOTE — Telephone Encounter (Signed)
Pt complaining of feeling tired and bloated.  He states his weight today is 236#.  His glucose just now was 191 and he last ate at 4:00am.  He states he has some swelling in his feet to just above his ankles (bil).  No sob.

## 2011-06-05 NOTE — Telephone Encounter (Signed)
06/05/11--spoke with s weaver and reviewed chart--per scott weaver have pt d/c HCTZ and start lasix 40mg  qd and come in for BMET in 1 week--pt notified and will come for bloodwork in 1 week (BMET)--nt

## 2011-06-05 NOTE — Telephone Encounter (Signed)
New msg Pt said he has been retaining fluid and feels bloated. He has gained weight since being seen last week. Please call him back

## 2011-06-05 NOTE — Telephone Encounter (Signed)
Agree. Call if not improving. Tereso Newcomer, PA-C  4:55 PM 06/05/2011

## 2011-06-12 ENCOUNTER — Ambulatory Visit (INDEPENDENT_AMBULATORY_CARE_PROVIDER_SITE_OTHER): Payer: Medicare Other | Admitting: *Deleted

## 2011-06-12 DIAGNOSIS — I10 Essential (primary) hypertension: Secondary | ICD-10-CM

## 2011-06-12 DIAGNOSIS — I509 Heart failure, unspecified: Secondary | ICD-10-CM

## 2011-06-12 DIAGNOSIS — I4891 Unspecified atrial fibrillation: Secondary | ICD-10-CM

## 2011-06-12 LAB — BASIC METABOLIC PANEL
CO2: 28 mEq/L (ref 19–32)
Chloride: 107 mEq/L (ref 96–112)
Potassium: 4.8 mEq/L (ref 3.5–5.1)

## 2011-06-13 ENCOUNTER — Telehealth: Payer: Self-pay | Admitting: Cardiology

## 2011-06-13 NOTE — Telephone Encounter (Signed)
New msg: Pt calling wanting to know if MD wants pt to continue taking propranolol. Please return pt call to discuss further.

## 2011-06-13 NOTE — Telephone Encounter (Signed)
Will forward to [facility] to verify.  I don't see any where that it was discontinued.

## 2011-06-13 NOTE — Telephone Encounter (Signed)
Propranolol is to be continued as currently prescribed. Tereso Newcomer, PA-C  1:39 PM 06/13/2011

## 2011-06-13 NOTE — Telephone Encounter (Signed)
Left message for pt to continue medication as ordered.  Requested that he call his pharmacy if he needs a review.

## 2011-06-18 ENCOUNTER — Encounter: Payer: Self-pay | Admitting: *Deleted

## 2011-06-19 ENCOUNTER — Other Ambulatory Visit: Payer: Self-pay | Admitting: *Deleted

## 2011-06-19 MED ORDER — PROPRANOLOL HCL ER 60 MG PO CP24: 60.0000 mg | ORAL_CAPSULE | Freq: Every day | ORAL | Status: DC

## 2011-06-24 ENCOUNTER — Inpatient Hospital Stay (HOSPITAL_COMMUNITY)
Admission: EM | Admit: 2011-06-24 | Discharge: 2011-06-25 | DRG: 194 | Discharge status: Against Medical Advice | Payer: Medicare Other | Source: Ambulatory Visit | Attending: Internal Medicine | Admitting: Internal Medicine

## 2011-06-24 ENCOUNTER — Encounter (HOSPITAL_COMMUNITY): Payer: Self-pay | Admitting: Emergency Medicine

## 2011-06-24 ENCOUNTER — Emergency Department (HOSPITAL_COMMUNITY): Payer: Medicare Other

## 2011-06-24 DIAGNOSIS — I509 Heart failure, unspecified: Secondary | ICD-10-CM | POA: Diagnosis present

## 2011-06-24 DIAGNOSIS — I4891 Unspecified atrial fibrillation: Secondary | ICD-10-CM | POA: Diagnosis present

## 2011-06-24 DIAGNOSIS — F17200 Nicotine dependence, unspecified, uncomplicated: Secondary | ICD-10-CM | POA: Diagnosis present

## 2011-06-24 DIAGNOSIS — E119 Type 2 diabetes mellitus without complications: Secondary | ICD-10-CM | POA: Diagnosis present

## 2011-06-24 DIAGNOSIS — R5381 Other malaise: Secondary | ICD-10-CM | POA: Diagnosis present

## 2011-06-24 DIAGNOSIS — I10 Essential (primary) hypertension: Secondary | ICD-10-CM | POA: Diagnosis present

## 2011-06-24 DIAGNOSIS — I5022 Chronic systolic (congestive) heart failure: Secondary | ICD-10-CM | POA: Diagnosis present

## 2011-06-24 DIAGNOSIS — I639 Cerebral infarction, unspecified: Secondary | ICD-10-CM | POA: Diagnosis present

## 2011-06-24 DIAGNOSIS — E785 Hyperlipidemia, unspecified: Secondary | ICD-10-CM | POA: Diagnosis present

## 2011-06-24 DIAGNOSIS — J441 Chronic obstructive pulmonary disease with (acute) exacerbation: Secondary | ICD-10-CM | POA: Diagnosis present

## 2011-06-24 DIAGNOSIS — F319 Bipolar disorder, unspecified: Secondary | ICD-10-CM | POA: Diagnosis present

## 2011-06-24 DIAGNOSIS — G4733 Obstructive sleep apnea (adult) (pediatric): Secondary | ICD-10-CM | POA: Diagnosis present

## 2011-06-24 DIAGNOSIS — J189 Pneumonia, unspecified organism: Principal | ICD-10-CM | POA: Diagnosis present

## 2011-06-24 DIAGNOSIS — I482 Chronic atrial fibrillation, unspecified: Secondary | ICD-10-CM

## 2011-06-24 DIAGNOSIS — R0902 Hypoxemia: Secondary | ICD-10-CM

## 2011-06-24 DIAGNOSIS — E66811 Obesity, class 1: Secondary | ICD-10-CM

## 2011-06-24 DIAGNOSIS — D72829 Elevated white blood cell count, unspecified: Secondary | ICD-10-CM | POA: Diagnosis present

## 2011-06-24 DIAGNOSIS — Z720 Tobacco use: Secondary | ICD-10-CM | POA: Diagnosis present

## 2011-06-24 DIAGNOSIS — E669 Obesity, unspecified: Secondary | ICD-10-CM

## 2011-06-24 HISTORY — DX: Mental disorder, not otherwise specified: F99

## 2011-06-24 HISTORY — DX: Myoneural disorder, unspecified: G70.9

## 2011-06-24 HISTORY — DX: Cerebral infarction, unspecified: I63.9

## 2011-06-24 LAB — CBC
Hemoglobin: 12.4 g/dL — ABNORMAL LOW (ref 13.0–17.0)
MCH: 31.8 pg (ref 26.0–34.0)
MCHC: 32.5 g/dL (ref 30.0–36.0)
Platelets: 191 10*3/uL (ref 150–400)
RDW: 14 % (ref 11.5–15.5)

## 2011-06-24 LAB — COMPREHENSIVE METABOLIC PANEL
ALT: 6 U/L (ref 0–53)
AST: 11 U/L (ref 0–37)
Albumin: 2.9 g/dL — ABNORMAL LOW (ref 3.5–5.2)
Alkaline Phosphatase: 80 U/L (ref 39–117)
BUN: 11 mg/dL (ref 6–23)
Chloride: 110 mEq/L (ref 96–112)
Potassium: 3.7 mEq/L (ref 3.5–5.1)
Sodium: 144 mEq/L (ref 135–145)
Total Bilirubin: 0.3 mg/dL (ref 0.3–1.2)
Total Protein: 6.4 g/dL (ref 6.0–8.3)

## 2011-06-24 LAB — RAPID URINE DRUG SCREEN, HOSP PERFORMED
Benzodiazepines: NOT DETECTED
Cocaine: NOT DETECTED
Opiates: NOT DETECTED
Tetrahydrocannabinol: NOT DETECTED

## 2011-06-24 LAB — POCT I-STAT 3, ART BLOOD GAS (G3+)
O2 Saturation: 88 %
Patient temperature: 98.9
TCO2: 27 mmol/L (ref 0–100)

## 2011-06-24 LAB — DIFFERENTIAL
Basophils Absolute: 0 10*3/uL (ref 0.0–0.1)
Basophils Relative: 0 % (ref 0–1)
Eosinophils Absolute: 0.1 10*3/uL (ref 0.0–0.7)
Neutro Abs: 8.6 10*3/uL — ABNORMAL HIGH (ref 1.7–7.7)
Neutrophils Relative %: 73 % (ref 43–77)

## 2011-06-24 LAB — URINALYSIS, ROUTINE W REFLEX MICROSCOPIC
Bilirubin Urine: NEGATIVE
Glucose, UA: NEGATIVE mg/dL
Leukocytes, UA: NEGATIVE
Nitrite: NEGATIVE
Specific Gravity, Urine: 1.004 — ABNORMAL LOW (ref 1.005–1.030)
pH: 7 (ref 5.0–8.0)

## 2011-06-24 LAB — LACTIC ACID, PLASMA: Lactic Acid, Venous: 1.1 mmol/L (ref 0.5–2.2)

## 2011-06-24 LAB — TROPONIN I: Troponin I: <0.3 ng/mL (ref <0.30)

## 2011-06-24 LAB — PROTIME-INR
INR: 1.97 — ABNORMAL HIGH (ref 0.00–1.49)
Prothrombin Time: 22.8 seconds — ABNORMAL HIGH (ref 11.6–15.2)

## 2011-06-24 MED ORDER — SODIUM CHLORIDE 0.9 % IV SOLN: INTRAVENOUS | Status: DC

## 2011-06-24 MED ORDER — SODIUM CHLORIDE 0.9 % IV BOLUS (SEPSIS): 1000.0000 mL | Freq: Once | INTRAVENOUS | Status: DC

## 2011-06-24 MED ORDER — SODIUM CHLORIDE 0.9 % IV BOLUS (SEPSIS)
2000.0000 mL | Freq: Once | INTRAVENOUS | Status: AC
  Administered 2011-06-24: 2000 mL via INTRAVENOUS

## 2011-06-24 MED ORDER — ALBUTEROL SULFATE (5 MG/ML) 0.5% IN NEBU
5.0000 mg | INHALATION_SOLUTION | Freq: Once | RESPIRATORY_TRACT | Status: AC
  Administered 2011-06-24: 5 mg via RESPIRATORY_TRACT
  Filled 2011-06-24: qty 1

## 2011-06-24 MED ORDER — CEFTRIAXONE SODIUM 1 G IJ SOLR: 1.0000 g | Freq: Once | INTRAMUSCULAR | Status: DC

## 2011-06-24 MED ORDER — NALOXONE HCL 1 MG/ML IJ SOLN
INTRAMUSCULAR | Status: AC
  Administered 2011-06-24: 0.4 mg via INTRAVENOUS
  Filled 2011-06-24: qty 2

## 2011-06-24 MED ORDER — IPRATROPIUM BROMIDE 0.02 % IN SOLN
0.5000 mg | Freq: Once | RESPIRATORY_TRACT | Status: AC
  Administered 2011-06-24: 0.5 mg via RESPIRATORY_TRACT
  Filled 2011-06-24: qty 2.5

## 2011-06-24 MED ORDER — DEXTROSE 5 % IV SOLN
500.0000 mg | Freq: Once | INTRAVENOUS | Status: AC
  Administered 2011-06-24: 500 mg via INTRAVENOUS
  Filled 2011-06-24 (×2): qty 500

## 2011-06-24 MED ORDER — NALOXONE HCL 0.4 MG/ML IJ SOLN: 0.2000 mg | Freq: Once | INTRAMUSCULAR | Status: DC

## 2011-06-24 MED ORDER — METHYLPREDNISOLONE SODIUM SUCC 125 MG IJ SOLR
125.0000 mg | Freq: Once | INTRAMUSCULAR | Status: AC
  Administered 2011-06-24: 125 mg via INTRAVENOUS
  Filled 2011-06-24: qty 2

## 2011-06-24 MED ORDER — DEXTROSE 5 % IV SOLN
1.0000 g | Freq: Once | INTRAVENOUS | Status: AC
  Administered 2011-06-24: 1 g via INTRAVENOUS

## 2011-06-24 MED ORDER — DEXTROSE 5 % IV SOLN
INTRAVENOUS | Status: AC
  Administered 2011-06-24: 23:00:00 via INTRAVENOUS
  Filled 2011-06-24: qty 10

## 2011-06-24 NOTE — ED Notes (Signed)
Respiratory called to administer nebulizer treatment, start adult wheeze protocol, and to collect ABG.

## 2011-06-24 NOTE — Progress Notes (Signed)
Pt. Unable to perform peak flow. Neb given, 93% on room air. HR 100, BP 121/69.

## 2011-06-24 NOTE — ED Notes (Signed)
Waiting for blood cultures to be drawn before administration of antibiotics.

## 2011-06-24 NOTE — ED Notes (Signed)
X-ray at bedside

## 2011-06-24 NOTE — ED Notes (Signed)
Respiratory at bedside to administer nebulizer treatment

## 2011-06-24 NOTE — ED Notes (Signed)
Per EMS, patient felt short of breath and faint -- called EMS.  Patient was being held up by family members upon EMS arrival -- patient unable to stand without assistance.  Patient states that he has felt extremely tired and weak today (body aches all over) with a cough.  Patient pale and diaphoretic.  IV started in left forearm (18 GA).

## 2011-06-25 ENCOUNTER — Encounter (HOSPITAL_COMMUNITY): Payer: Self-pay | Admitting: Family Medicine

## 2011-06-25 ENCOUNTER — Inpatient Hospital Stay (HOSPITAL_COMMUNITY): Payer: Medicare Other

## 2011-06-25 ENCOUNTER — Other Ambulatory Visit: Payer: Self-pay

## 2011-06-25 DIAGNOSIS — J189 Pneumonia, unspecified organism: Secondary | ICD-10-CM | POA: Diagnosis present

## 2011-06-25 DIAGNOSIS — E669 Obesity, unspecified: Secondary | ICD-10-CM

## 2011-06-25 LAB — CBC
Hemoglobin: 12.5 g/dL — ABNORMAL LOW (ref 13.0–17.0)
MCH: 31.4 pg (ref 26.0–34.0)
MCHC: 31.4 g/dL (ref 30.0–36.0)
MCV: 100 fL (ref 78.0–100.0)
Platelets: 187 10*3/uL (ref 150–400)

## 2011-06-25 LAB — BASIC METABOLIC PANEL
BUN: 14 mg/dL (ref 6–23)
CO2: 27 mEq/L (ref 19–32)
Calcium: 8.9 mg/dL (ref 8.4–10.5)
GFR calc non Af Amer: 74 mL/min — ABNORMAL LOW (ref >90)
Glucose, Bld: 227 mg/dL — ABNORMAL HIGH (ref 70–99)

## 2011-06-25 LAB — PROTIME-INR: Prothrombin Time: 23.6 seconds — ABNORMAL HIGH (ref 11.6–15.2)

## 2011-06-25 LAB — TROPONIN I
Troponin I: <0.3 ng/mL (ref <0.30)
Troponin I: <0.3 ng/mL (ref <0.30)

## 2011-06-25 LAB — GLUCOSE, CAPILLARY: Glucose-Capillary: 182 mg/dL — ABNORMAL HIGH (ref 70–99)

## 2011-06-25 MED ORDER — CEFTRIAXONE SODIUM 1 G IJ SOLR
1.0000 g | Freq: Every day | INTRAMUSCULAR | Status: DC
  Filled 2011-06-25: qty 10

## 2011-06-25 MED ORDER — SODIUM CHLORIDE 0.9 % IJ SOLN: 3.0000 mL | INTRAMUSCULAR | Status: DC | PRN

## 2011-06-25 MED ORDER — SIMVASTATIN 40 MG PO TABS
40.0000 mg | ORAL_TABLET | Freq: Every evening | ORAL | Status: DC
  Filled 2011-06-25: qty 1

## 2011-06-25 MED ORDER — METHYLPREDNISOLONE SODIUM SUCC 40 MG IJ SOLR
40.0000 mg | Freq: Two times a day (BID) | INTRAMUSCULAR | Status: DC
  Administered 2011-06-25: 40 mg via INTRAVENOUS
  Filled 2011-06-25 (×2): qty 1

## 2011-06-25 MED ORDER — POTASSIUM CHLORIDE CRYS ER 20 MEQ PO TBCR
20.0000 meq | EXTENDED_RELEASE_TABLET | ORAL | Status: AC
  Administered 2011-06-25: 20 meq via ORAL
  Filled 2011-06-25: qty 1

## 2011-06-25 MED ORDER — BUSPIRONE HCL 15 MG PO TABS
7.5000 mg | ORAL_TABLET | Freq: Every day | ORAL | Status: DC
  Administered 2011-06-25: 7.5 mg via ORAL
  Filled 2011-06-25: qty 1

## 2011-06-25 MED ORDER — NICOTINE 14 MG/24HR TD PT24
14.0000 mg | MEDICATED_PATCH | Freq: Every day | TRANSDERMAL | Status: DC
  Administered 2011-06-25: 14 mg via TRANSDERMAL
  Filled 2011-06-25 (×2): qty 1

## 2011-06-25 MED ORDER — GABAPENTIN 600 MG PO TABS
600.0000 mg | ORAL_TABLET | Freq: Once | ORAL | Status: AC
  Administered 2011-06-25: 600 mg via ORAL
  Filled 2011-06-25: qty 1

## 2011-06-25 MED ORDER — INSULIN ASPART 100 UNIT/ML ~~LOC~~ SOLN: 0.0000 [IU] | Freq: Every day | SUBCUTANEOUS | Status: DC

## 2011-06-25 MED ORDER — HYDROCODONE-ACETAMINOPHEN 5-325 MG PO TABS
1.0000 | ORAL_TABLET | Freq: Four times a day (QID) | ORAL | Status: DC | PRN
  Administered 2011-06-25: 1 via ORAL
  Filled 2011-06-25: qty 1

## 2011-06-25 MED ORDER — ACETAMINOPHEN 325 MG PO TABS: 650.0000 mg | ORAL_TABLET | Freq: Four times a day (QID) | ORAL | Status: DC | PRN

## 2011-06-25 MED ORDER — NITROGLYCERIN 0.4 MG SL SUBL: 0.4000 mg | SUBLINGUAL_TABLET | SUBLINGUAL | Status: DC | PRN

## 2011-06-25 MED ORDER — DEXTROSE 5 % IV SOLN
500.0000 mg | Freq: Every day | INTRAVENOUS | Status: DC
  Filled 2011-06-25: qty 500

## 2011-06-25 MED ORDER — INSULIN ASPART 100 UNIT/ML ~~LOC~~ SOLN
0.0000 [IU] | Freq: Three times a day (TID) | SUBCUTANEOUS | Status: DC
  Administered 2011-06-25: 3 [IU] via SUBCUTANEOUS
  Administered 2011-06-25: 5 [IU] via SUBCUTANEOUS

## 2011-06-25 MED ORDER — DIAZEPAM 5 MG PO TABS
5.0000 mg | ORAL_TABLET | Freq: Every day | ORAL | Status: DC | PRN
Start: 2011-06-25 — End: 2011-06-25

## 2011-06-25 MED ORDER — GABAPENTIN 600 MG PO TABS
600.0000 mg | ORAL_TABLET | Freq: Two times a day (BID) | ORAL | Status: DC
  Administered 2011-06-25: 600 mg via ORAL
  Filled 2011-06-25 (×2): qty 1

## 2011-06-25 MED ORDER — LITHIUM CARBONATE ER 300 MG PO TBCR
300.0000 mg | EXTENDED_RELEASE_TABLET | Freq: Two times a day (BID) | ORAL | Status: DC
  Administered 2011-06-25 (×2): 300 mg via ORAL
  Filled 2011-06-25 (×3): qty 1

## 2011-06-25 MED ORDER — ACETAMINOPHEN 650 MG RE SUPP: 650.0000 mg | Freq: Four times a day (QID) | RECTAL | Status: DC | PRN

## 2011-06-25 MED ORDER — ONDANSETRON HCL 4 MG/2ML IJ SOLN: 4.0000 mg | Freq: Four times a day (QID) | INTRAMUSCULAR | Status: DC | PRN

## 2011-06-25 MED ORDER — CLONIDINE HCL 0.2 MG PO TABS
0.2000 mg | ORAL_TABLET | Freq: Two times a day (BID) | ORAL | Status: DC
  Administered 2011-06-25: 0.2 mg via ORAL
  Filled 2011-06-25 (×2): qty 1

## 2011-06-25 MED ORDER — ONDANSETRON HCL 4 MG PO TABS: 4.0000 mg | ORAL_TABLET | Freq: Four times a day (QID) | ORAL | Status: DC | PRN

## 2011-06-25 MED ORDER — ALBUTEROL SULFATE (5 MG/ML) 0.5% IN NEBU: 2.5000 mg | INHALATION_SOLUTION | RESPIRATORY_TRACT | Status: DC | PRN

## 2011-06-25 MED ORDER — ASPIRIN EC 81 MG PO TBEC
81.0000 mg | DELAYED_RELEASE_TABLET | Freq: Every day | ORAL | Status: DC
  Administered 2011-06-25: 81 mg via ORAL
  Filled 2011-06-25: qty 1

## 2011-06-25 MED ORDER — WARFARIN - PHARMACIST DOSING INPATIENT: Freq: Every day | Status: DC

## 2011-06-25 MED ORDER — WARFARIN SODIUM 5 MG PO TABS
5.0000 mg | ORAL_TABLET | Freq: Once | ORAL | Status: DC
  Filled 2011-06-25: qty 1

## 2011-06-25 MED ORDER — LISINOPRIL 40 MG PO TABS
40.0000 mg | ORAL_TABLET | Freq: Every day | ORAL | Status: DC
  Administered 2011-06-25: 40 mg via ORAL
  Filled 2011-06-25: qty 1

## 2011-06-25 MED ORDER — BENZONATATE 100 MG PO CAPS
100.0000 mg | ORAL_CAPSULE | Freq: Three times a day (TID) | ORAL | Status: DC | PRN
  Filled 2011-06-25: qty 1

## 2011-06-25 MED ORDER — PROPRANOLOL HCL ER 60 MG PO CP24
60.0000 mg | ORAL_CAPSULE | Freq: Every day | ORAL | Status: DC
  Administered 2011-06-25: 60 mg via ORAL
  Filled 2011-06-25: qty 1

## 2011-06-25 MED ORDER — PANTOPRAZOLE SODIUM 40 MG PO TBEC
40.0000 mg | DELAYED_RELEASE_TABLET | Freq: Every day | ORAL | Status: DC
  Administered 2011-06-25: 40 mg via ORAL
  Filled 2011-06-25: qty 1

## 2011-06-25 MED ORDER — FUROSEMIDE 10 MG/ML IJ SOLN
20.0000 mg | Freq: Two times a day (BID) | INTRAMUSCULAR | Status: DC
  Administered 2011-06-25: 20 mg via INTRAVENOUS
  Filled 2011-06-25 (×3): qty 2

## 2011-06-25 MED ORDER — GLIMEPIRIDE 2 MG PO TABS
2.0000 mg | ORAL_TABLET | Freq: Every day | ORAL | Status: DC
  Administered 2011-06-25: 2 mg via ORAL
  Filled 2011-06-25 (×2): qty 1

## 2011-06-25 MED ORDER — IPRATROPIUM BROMIDE 0.02 % IN SOLN: 0.5000 mg | RESPIRATORY_TRACT | Status: DC | PRN

## 2011-06-25 NOTE — Progress Notes (Signed)
Nursing Note: Witnessed patient refusing to sign AMA paperwork in front of RN. Patient wishes to leave against medical advice. Gregery Na, AD

## 2011-06-25 NOTE — ED Notes (Signed)
Asked EDP if he wants bolus 2 liters of normal saline with history of CHF; EDP states that patient has possible sepsis.  EDP reports that ejection fraction is normal and to administer bolus.

## 2011-06-25 NOTE — H&P (Addendum)
PCP:   [name], MD, MD   Chief Complaint:  Shortness of breath and weakness  HPI: This is a 56 year old gentleman who presents to the ER with shortness of breath and weakness. I am unable to get a good history from the patient because patient is hypersomnolent. He does arouse enough to report the above but not much else. Patient does not appear toxic. Patient states he is sleepy because of Seroquel and his obstructive sleep apnea. Per ER physician the patient was short of breath, wheezing, and hypoxic on presentation. Patient is not on home oxygen but needed 4 L nasal cannula to keep sats greater than 90%. He does not report chest pains. The patient does report some difficulty with walking.   Review of Systems:  Unable to accurately obtain due to patient's hypersomnolence   Past Medical History: Past Medical History  Diagnosis Date  . Hypertension   . Diabetes mellitus   . Bipolar 1 disorder   . Hyperlipidemia   . Atrial fibrillation   . COPD (chronic obstructive pulmonary disease)   . Obstructive sleep apnea   . Chronic diastolic heart failure     Echocardiogram 05/14/11: Mild LVH, EF 50-55%, no wall motion abnormalities, grade 2 diastolic dysfunction.  . Chest pain     Myoview 05/17/11 demonstrated an EF of 61%, small fixed inferoseptal defect consistent with thinning vs prior infarct; no ischemia. - low risk  . Diabetic neuropathy   . Chronic back pain   . History of stroke    History reviewed. No pertinent past surgical history.  Medications: Prior to Admission medications   Medication Sig Start Date End Date Taking? Authorizing Provider  aspirin EC 81 MG EC tablet Take 1 tablet (81 mg total) by mouth daily. 05/15/11 05/14/12  Ok Anis, NP  busPIRone (BUSPAR) 7.5 MG tablet Take 7.5 mg by mouth daily.  05/16/11   Historical Provider, MD  cloNIDine (CATAPRES) 0.2 MG tablet Take 0.2 mg by mouth 2 (two) times daily.  05/16/11   Historical Provider, MD  diazepam  (VALIUM) 10 MG tablet Take 10 mg by mouth daily.  04/25/11   Historical Provider, MD  furosemide (LASIX) 40 MG tablet Take 1 tablet (40 mg total) by mouth daily. 06/05/11 06/04/12  Beatrice Lecher, PA  gabapentin (NEURONTIN) 600 MG tablet Take 600 mg by mouth 2 (two) times daily.  05/16/11   Historical Provider, MD  glimepiride (AMARYL) 2 MG tablet Take 2 mg by mouth daily before breakfast. Take 1/2 tablet daily    Historical Provider, MD  HYDROcodone-acetaminophen (VICODIN) 5-500 MG per tablet Take 1 tablet by mouth every 6 (six) hours as needed.  05/11/11   Historical Provider, MD  lisinopril (PRINIVIL,ZESTRIL) 40 MG tablet Take 40 mg by mouth daily.    Historical Provider, MD  lithium carbonate (LITHOBID) 300 MG CR tablet Take 300 mg by mouth 2 (two) times daily.    Historical Provider, MD  mupirocin ointment (BACTROBAN) 2 % Apply 1 application topically as needed.  02/22/11   Historical Provider, MD  nitroGLYCERIN (NITROSTAT) 0.4 MG SL tablet Place 1 tablet (0.4 mg total) under the tongue every 5 (five) minutes x 3 doses as needed for chest pain. 05/15/11 05/14/12  Ok Anis, NP  omeprazole (PRILOSEC) 20 MG capsule Take 20 mg by mouth daily.    Historical Provider, MD  potassium chloride SA (K-DUR,KLOR-CON) 20 MEQ tablet Take 1 tablet (20 mEq total) by mouth now. 05/15/11 05/14/12  Ok Anis, NP  propranolol (INDERAL LA) 60 MG 24 hr capsule Take 1 capsule (60 mg total) by mouth daily. ( UNKNOWN PRESCRIPTION ) 06/19/11   Rollene Rotunda, MD  simvastatin (ZOCOR) 40 MG tablet Take 40 mg by mouth every evening.    Historical Provider, MD  warfarin (COUMADIN) 5 MG tablet Take 5mg  alternating with 2.5mg  every other day as previously prescribed. 05/15/11   Ok Anis, NP    Allergies:   Allergies  Allergen Reactions  . Muscle Relief (Capsaicin Hp)     Hysteria     Social History:  reports that he has been smoking Cigarettes.  He has a 43.5 pack-year smoking history. He does not  have any smokeless tobacco history on file. He reports that he does not drink alcohol or use illicit drugs.  Family History: History reviewed. No pertinent family history.  Physical Exam: Filed Vitals:   06/24/11 2300 06/24/11 2304 06/24/11 2311 06/24/11 2330  BP: 121/69  121/69 113/49  Pulse: 94  97 95  Temp:      TempSrc:      Resp: 28  24 23   SpO2: 92% 94% 100% 98%    General:  Alert and oriented but very somnolent, well developed and nourished, no acute distress Eyes: PERRLA, pink conjunctiva, no scleral icterus ENT: Moist oral mucosa, neck supple, no thyromegaly Lungs: clear to ascultation, generalized wheeze, no crackles, no use of accessory muscles Cardiovascular: regular rate and rhythm, no regurgitation, no gallops, no murmurs. No carotid bruits, no JVD Abdomen: soft, positive BS, non-tender, non-distended, no organomegaly, not an acute abdomen GU: not examined Neuro: CN II - XII appears grossly intact Musculoskeletal: no clubbing, cyanosis or 1+ pitting bilateral lower edema Skin: no rash, no subcutaneous crepitation, no decubitus    Labs on Admission:   [facility] 06/24/11 2247  NA 144  K 3.7  CL 110  CO2 27  GLUCOSE 99  BUN 11  CREATININE 1.14  CALCIUM 8.8  MG --  PHOS --  Results for LORRIS, CARDUCCI (MRN [id number]) as of 06/25/2011 00:29  Ref. Range 06/24/2011 23:10  Sample type No range found ARTERIAL  pH, Arterial Latest Range: 7.350-7.450  7.361  pCO2 arterial Latest Range: 35.0-45.0 mmHg 46.2 (H)  pO2, Arterial Latest Range: 80.0-100.0 mmHg 58.0 (L)  Bicarbonate Latest Range: 20.0-24.0 mEq/L 26.1 (H)  TCO2 Latest Range: 0-100 mmol/L 27  O2 Saturation No range found 88.0  Patient temperature No range found 98.9 F  Collection site No range found RADIAL, ALLEN'S TEST ACCEPTABLE    Basename 06/24/11 2247  AST 11  ALT 6  ALKPHOS 80  BILITOT 0.3  PROT 6.4  ALBUMIN 2.9*  Results for ITALO, BANTON (MRN [id number]) as of 06/25/2011 00:29  Ref. Range  06/24/2011 22:37  Lithium Lvl Latest Range: 0.80-1.40 mEq/L 0.60 (L)   No results found for this basename: LIPASE:2,AMYLASE:2 in the last 72 hours  Basename 06/24/11 2247  WBC 11.9*  NEUTROABS 8.6*  HGB 12.4*  HCT 38.2*  MCV 97.9  PLT 191    Basename 06/24/11 2247  CKTOTAL --  CKMB --  CKMBINDEX --  TROPONINI <0.30  Results for IZAAH, WESTMAN (MRN [id number]) as of 06/25/2011 00:29  Ref. Range 06/24/2011 22:47  Troponin I Latest Range: <0.30 ng/mL <0.30  Pro B Natriuretic peptide (BNP) Latest Range: 0-125 pg/mL 4434.0 (H)  Results for JERVIS, TRAPANI (MRN [id number]) as of 06/25/2011 00:29  Ref. Range 06/24/2011 22:47  Prothrombin Time Latest Range: 11.6-15.2 seconds 22.8 (H)  INR  Latest Range: 0.00-1.49  1.97 (H)  Results for ETHER, WOLTERS (MRN [id number]) as of 06/25/2011 00:29  Ref. Range 06/24/2011 22:39 06/24/2011 22:47  Alcohol, Ethyl (B) Latest Range: 0-11 mg/dL  <40  Amphetamines Latest Range: NONE DETECTED  NONE DETECTED   Barbiturates Latest Range: NONE DETECTED  NONE DETECTED   Benzodiazepines Latest Range: NONE DETECTED  NONE DETECTED   Opiates Latest Range: NONE DETECTED  NONE DETECTED   COCAINE Latest Range: NONE DETECTED  NONE DETECTED   Tetrahydrocannabinol Latest Range: NONE DETECTED  NONE DETECTED    No components found with this basename: POCBNP:3 No results found for this basename: DDIMER:2 in the last 72 hours No results found for this basename: HGBA1C:2 in the last 72 hours No results found for this basename: CHOL:2,HDL:2,LDLCALC:2,TRIG:2,CHOLHDL:2,LDLDIRECT:2 in the last 72 hours No results found for this basename: TSH,T4TOTAL,FREET3,T3FREE,THYROIDAB in the last 72 hours No results found for this basename: VITAMINB12:2,FOLATE:2,FERRITIN:2,TIBC:2,IRON:2,RETICCTPCT:2 in the last 72 hours  Micro Results: No results found for this or any previous visit (from the past 240 hour(s)). Results for LOIS, OSTROM (MRN [id number]) as of 06/25/2011 00:29  Ref. Range  06/24/2011 22:39  Color, Urine Latest Range: YELLOW  YELLOW  APPearance Latest Range: CLEAR  CLEAR  Specific Gravity, Urine Latest Range: 1.005-1.030  1.004 (L)  pH Latest Range: 5.0-8.0  7.0  Glucose, UA Latest Range: NEGATIVE mg/dL NEGATIVE  Bilirubin Urine Latest Range: NEGATIVE  NEGATIVE  Ketones, ur Latest Range: NEGATIVE mg/dL NEGATIVE  Protein Latest Range: NEGATIVE mg/dL NEGATIVE  Urobilinogen, UA Latest Range: 0.0-1.0 mg/dL 0.2  Nitrite Latest Range: NEGATIVE  NEGATIVE  Leukocytes, UA Latest Range: NEGATIVE  NEGATIVE  Hgb urine dipstick Latest Range: NEGATIVE  NEGATIVE    Radiological Exams on Admission: Dg Chest Port 1 View  06/24/2011  *RADIOLOGY REPORT*  Clinical Data: Fever.  Cough.  PORTABLE CHEST - 1 VIEW  Comparison: 05/13/2011.  Findings: Interval development of right infrahilar infiltrate. Patchiness in the perihilar region with pulmonary vascular congestion.  No gross pneumothorax.  Cardiomegaly.  Calcified aorta.  IMPRESSION: Right infrahilar infiltrate.  Pulmonary vascular congestion.  Cardiomegaly.  Original Report Authenticated By: Fuller Canada, M.D.    04/24/2011 Transthoracic Echocardiography  Patient: Sriman, Tally MR #: [id number] Study Date: 05/14/2011 Gender: M Age: 29 Height: 172.7cm Weight: 98kg BSA: 2.45m^2 Pt. Status: Room: 2927  ADMITTING Nahser, Philip ATTENDING Nahser, Loistine Chance PERFORMING [place], [facility] Rollene Rotunda, MD SONOGRAPHER Meagan Johnson  LV EF: 50% - 55%    EKG:   Assessment/Plan Present on Admission:  .Community acquired pneumonia/leukocytosis Admit to telemetry Sputum cultures and blood cultures ordered Antibiotics Rocephin and azithromycin ordered Congestive heart failure systolic dysfunction IV Lasix twice daily Repeat BNP and chest x-ray in a.m. 2-D echo not ordered, patient's EF 50-55% Will cycle cardiac enzymes as patient is sleepy and therefore not a great historian Obstructive sleep apnea on   CPAP Morbid obesity COPD with mild exacerbation Ongoing tobacco abuse CPAP ordered Nebulizers, low dose Solu-Medrol IV ordered Nicotine patch Oxygen keep sats greater than 88% .DM (diabetes mellitus) .Bipolar disorder .CVA (cerebral infarction) .Dyslipidemia .HTN (hypertension) .Atrial fibrillation Stable, resume home medications ADA diet with sliding scale insulin Coumadin pharmacy to dose Deconditioning Physical therapy consult Hypersomnolence Patient's Valium and hydrocodone decreased Patient not on Seroquel Will placed patient on antibiotics and CPAP and reevaluate in the a.m.   Coumadin for DVT prophylaxis Team 3/Dr. Oleh Genin, Zyonna Vardaman 06/25/2011, 12:58 AM

## 2011-06-25 NOTE — Progress Notes (Signed)
Nursing Note: Pt to be discharged against medical advice. Pt states "I know it was my Seroquel to cause my body to react like this and I am ready to go home". MD is aware of pt's readiness to leave against medical advise, MD ordered nurse to discontinue foley catheter before pt leaves. Pt's IV, foley catheter and cardiac monitor discontinued per MD's order. Pt encouraged to remain in hospital however pt insist on leaving to go home. Will discharge pt against medical advice per pt request. George Hugh RN.

## 2011-06-25 NOTE — Progress Notes (Signed)
Utilization review completed. Veto Macqueen Watson 06/25/2011 

## 2011-06-25 NOTE — ED Provider Notes (Signed)
History     CSN: [id number]  Arrival date & time 06/24/11  2226   First MD Initiated Contact with Patient 06/24/11 2234      Chief Complaint  Patient presents with  . Shortness of Breath    (Consider location/radiation/quality/duration/timing/severity/associated sxs/prior treatment) HPI This 56 year old male has a baseline history of obesity, COPD, chronic atrial fibrillation, congestive heart failure with ejection fraction 61% in February of 2013 on stress Myoview which was negative for reversible ischemia, who now presents with a one-day history of a cough, shortness of breath, hypoxemia with pulse oximetry 88% on 2 L nasal cannula oxygen, generalized weakness with inability to sit or stand, baseline chronic back pain, and baseline mild confusion to time because the patient states he never knows what day of the week it is or what month it is.  He feels generally weak all over with no chest pain abdominal pain vomiting rash headache stiff neck fever or chills. He has chronic peripheral edema which is stable. Past Medical History  Diagnosis Date  . Hypertension   . Diabetes mellitus   . Bipolar 1 disorder   . Hyperlipidemia   . Atrial fibrillation   . COPD (chronic obstructive pulmonary disease)   . Obstructive sleep apnea   . Chronic diastolic heart failure     Echocardiogram 05/14/11: Mild LVH, EF 50-55%, no wall motion abnormalities, grade 2 diastolic dysfunction.  . Chest pain     Myoview 05/17/11 demonstrated an EF of 61%, small fixed inferoseptal defect consistent with thinning vs prior infarct; no ischemia. - low risk  . Diabetic neuropathy   . Chronic back pain   . History of stroke   . Mental disorder     bipolar  . CHF (congestive heart failure)   . Stroke   . Neuromuscular disorder     diabetic neuropathy       Past Surgical History  Procedure Date  . No past surgeries     History reviewed. No pertinent family history.  History  Substance Use Topics  .  Smoking status: Current Everyday Smoker -- 1.5 packs/day for 29 years    Types: Cigarettes  . Smokeless tobacco: Never Used  . Alcohol Use: No      Review of Systems  Constitutional: Positive for fatigue. Negative for fever.       10 Systems reviewed and are negative for acute change except as noted in the HPI.  HENT: Negative for congestion.   Eyes: Negative for discharge and redness.  Respiratory: Positive for cough, shortness of breath and wheezing.   Cardiovascular: Negative for chest pain.  Gastrointestinal: Negative for vomiting and abdominal pain.  Musculoskeletal: Negative for back pain.  Skin: Negative for rash.  Neurological: Positive for weakness. Negative for syncope, numbness and headaches.  Psychiatric/Behavioral:       No behavior change.    Allergies  Muscle relief  Home Medications   No current outpatient prescriptions on file.  BP 143/91  Pulse 102  Temp(Src) 98 F (36.7 C) (Oral)  Resp 22  Ht 5\' 8"  (1.727 m)  Wt 224 lb 8 oz (101.833 kg)  BMI 34.14 kg/m2  SpO2 98%  Physical Exam  Nursing note and vitals reviewed. Constitutional:       Awake, alert, nontoxic appearance.  HENT:  Head: Atraumatic.  Mouth/Throat: No oropharyngeal exudate.       Mouth has dry mucosa  Eyes: Right eye exhibits no discharge. Left eye exhibits no discharge.  Neck: Neck supple.  Cardiovascular:  No murmur heard.      Tachycardic irregularly irregular rate and rhythm  Pulmonary/Chest: He is in respiratory distress. He has wheezes. He has rales. He exhibits no tenderness.       Decreased breath sounds bilaterally with mild retractions mild accessory muscle usage diffuse mild wheezing and rhonchi with crackles at the right base; the patient arrives in mild respiratory distress able to speak sentences  Abdominal: Soft. There is no tenderness. There is no rebound.  Musculoskeletal: He exhibits edema. He exhibits no tenderness.       Baseline ROM, no obvious new focal  weakness.  Neurological: He is alert.       Mental status and motor strength appears baseline for patient and situation. The patient is oriented to person and place but not to time he states that is baseline for him  Skin: No rash noted.  Psychiatric: He has a normal mood and affect.    ED Course  Procedures (including critical care time)  ECG: Atrial relation, ventricular 112, normal axis, RSR' in lead V1, no significant change compared to [id number]  The patient feels improved after nebulizer treatment fluids steroids and antibiotics in the emergency department. Reexamination of his lungs show scattered rhonchi, no further wheezing, no retractions, he is speaking full sentences, he does require 4 L nasal cannula oxygen to keep his pulse oximetry above 90%, he still has some crackles at the right base.[facility] already paged for admit.0025 Labs Reviewed  CBC - Abnormal; Notable for the following:    WBC 11.9 (*)    RBC 3.90 (*)    Hemoglobin 12.4 (*)    HCT 38.2 (*)    All other components within normal limits  DIFFERENTIAL - Abnormal; Notable for the following:    Neutro Abs 8.6 (*)    All other components within normal limits  COMPREHENSIVE METABOLIC PANEL - Abnormal; Notable for the following:    Albumin 2.9 (*)    GFR calc non Af Amer 70 (*)    GFR calc Af Amer 81 (*)    All other components within normal limits  URINALYSIS, ROUTINE W REFLEX MICROSCOPIC - Abnormal; Notable for the following:    Specific Gravity, Urine 1.004 (*)    All other components within normal limits  PRO B NATRIURETIC PEPTIDE - Abnormal; Notable for the following:    Pro B Natriuretic peptide (BNP) 4434.0 (*)    All other components within normal limits  PROTIME-INR - Abnormal; Notable for the following:    Prothrombin Time 22.8 (*)    INR 1.97 (*)    All other components within normal limits  LITHIUM LEVEL - Abnormal; Notable for the following:    Lithium Lvl 0.60 (*)    All other components within normal  limits  POCT I-STAT 3, BLOOD GAS (G3+) - Abnormal; Notable for the following:    pCO2 arterial 46.2 (*)    pO2, Arterial 58.0 (*)    Bicarbonate 26.1 (*)    All other components within normal limits  PRO B NATRIURETIC PEPTIDE - Abnormal; Notable for the following:    Pro B Natriuretic peptide (BNP) 4788.0 (*)    All other components within normal limits  BASIC METABOLIC PANEL - Abnormal; Notable for the following:    Glucose, Bld 227 (*)    GFR calc non Af Amer 74 (*)    GFR calc Af Amer 86 (*)    All other components within normal limits  CBC - Abnormal; Notable for  the following:    RBC 3.98 (*)    Hemoglobin 12.5 (*)    All other components within normal limits  PROTIME-INR - Abnormal; Notable for the following:    Prothrombin Time 23.6 (*)    INR 2.06 (*)    All other components within normal limits  GLUCOSE, CAPILLARY - Abnormal; Notable for the following:    Glucose-Capillary 226 (*)    All other components within normal limits  GLUCOSE, CAPILLARY - Abnormal; Notable for the following:    Glucose-Capillary 182 (*)    All other components within normal limits  LACTIC ACID, PLASMA  TROPONIN I  ETHANOL  URINE RAPID DRUG SCREEN (HOSP PERFORMED)  PROCALCITONIN  TROPONIN I  TROPONIN I  CULTURE, BLOOD (ROUTINE X 2)  CULTURE, BLOOD (ROUTINE X 2)  BLOOD GAS, ARTERIAL  CULTURE, SPUTUM-ASSESSMENT  TROPONIN I   Dg Chest Port 1 View  06/25/2011  *RADIOLOGY REPORT*  Clinical Data: Shortness of breath.  Follow up of pneumonia.  PORTABLE CHEST - 1 VIEW  Comparison: 1 day prior  Findings: Numerous leads and wires project over the chest.  Low inspiratory effort AP portable exam.  Mild cardiomegaly.  Mild right hemidiaphragm elevation.  No right and no definite left pleural effusion.  Left costophrenic angle minimally excluded. No pneumothorax.  Persistent right lower lobe/infrahilar airspace disease.  Improved to resolved interstitial edema.  IMPRESSION: Cardiomegaly with persistent  right base air space disease. Recommend radiographic follow-up until clearing.  Original Report Authenticated By: Consuello Bossier, M.D.   Dg Chest Port 1 View  06/24/2011  *RADIOLOGY REPORT*  Clinical Data: Fever.  Cough.  PORTABLE CHEST - 1 VIEW  Comparison: 05/13/2011.  Findings: Interval development of right infrahilar infiltrate. Patchiness in the perihilar region with pulmonary vascular congestion.  No gross pneumothorax.  Cardiomegaly.  Calcified aorta.  IMPRESSION: Right infrahilar infiltrate.  Pulmonary vascular congestion.  Cardiomegaly.  Original Report Authenticated By: Fuller Canada, M.D.     1. HCAP (healthcare-associated pneumonia)   2. COPD exacerbation   3. Hypoxia   4. Chronic atrial fibrillation       MDM   Patient / Family / Caregiver understand and agree with initial ED impression and plan with expectations set for ED visit.Pt stable in ED with no significant deterioration in condition.Patient / Family / Caregiver informed of clinical course, understand medical decision-making process, and agree with plan. The patient appears reasonably stabilized for admission considering the current resources, flow, and capabilities available in the ED at this time, and I doubt any other [facility] requiring further screening and/or treatment in the ED prior to admission.      Hurman Horn, MD 06/25/11 [phone]

## 2011-06-25 NOTE — Progress Notes (Signed)
Nursing Note: Pt leaving against medical advice, pt refuses to sign AMA paper. MD and charge nurse made aware. IV and cardiac monitor discontinued per MD orders. Chiropodist present at time pt stated "I am not going to sign AMA paper". No signs of distress at this time. Will continue to monitor pt until transportation (friend) arrives. George Hugh RN

## 2011-06-25 NOTE — ED Notes (Signed)
Patient currently sitting up in bed; no respiratory or acute distress noted.  Patient updated on plan of care; informed patient that lab results are starting to come back and that we are waiting on the EDP to come and talk to patient about results.  Patient urinated on self; cleaned patient and linens.  Patient has no other questions or concerns at this time; will continue to monitor.

## 2011-06-25 NOTE — ED Notes (Signed)
Pt transported to floor on cardiac monitor with RN.

## 2011-06-25 NOTE — Progress Notes (Signed)
ANTICOAGULATION CONSULT NOTE - Initial Consult  Pharmacy Consult for Coumadin Indication: atrial fibrillation  Allergies  Allergen Reactions  . Muscle Relief (Capsaicin Hp)     Hysteria     Patient Measurements: Height: 5\' 8"  (172.7 cm) (Stated) Weight: 224 lb 8 oz (101.833 kg) (Bed Scale; pt too weak and sedated to stand) IBW/kg (Calculated) : 68.4   Vital Signs: Temp: 98.8 F (37.1 C) (04/08 0155) Temp src: Oral (04/08 0155) BP: 136/80 mmHg (04/08 0155) Pulse Rate: 61  (04/08 0155)  Labs:  Basename 06/24/11 2247  HGB 12.4*  HCT 38.2*  PLT 191  APTT --  LABPROT 22.8*  INR 1.97*  HEPARINUNFRC --  CREATININE 1.14  CKTOTAL --  CKMB --  TROPONINI <0.30   Estimated Creatinine Clearance: 82.7 ml/min (by C-G formula based on Cr of 1.14).  Medical History: Past Medical History  Diagnosis Date  . Hypertension   . Diabetes mellitus   . Bipolar 1 disorder   . Hyperlipidemia   . Atrial fibrillation   . COPD (chronic obstructive pulmonary disease)   . Obstructive sleep apnea   . Chronic diastolic heart failure     Echocardiogram 05/14/11: Mild LVH, EF 50-55%, no wall motion abnormalities, grade 2 diastolic dysfunction.  . Chest pain     Myoview 05/17/11 demonstrated an EF of 61%, small fixed inferoseptal defect consistent with thinning vs prior infarct; no ischemia. - low risk  . Diabetic neuropathy   . Chronic back pain   . History of stroke     Medications:  Prescriptions prior to admission  Medication Sig Dispense Refill  . aspirin EC 81 MG EC tablet Take 1 tablet (81 mg total) by mouth daily.      . busPIRone (BUSPAR) 7.5 MG tablet Take 7.5 mg by mouth daily.       . cloNIDine (CATAPRES) 0.2 MG tablet Take 0.2 mg by mouth 2 (two) times daily.       . diazepam (VALIUM) 10 MG tablet Take 10 mg by mouth daily.       . furosemide (LASIX) 40 MG tablet Take 1 tablet (40 mg total) by mouth daily.  30 tablet  11  . gabapentin (NEURONTIN) 600 MG tablet Take 600 mg by  mouth 2 (two) times daily.       Marland Kitchen glimepiride (AMARYL) 2 MG tablet Take 2 mg by mouth daily before breakfast. Take 1/2 tablet daily      . HYDROcodone-acetaminophen (VICODIN) 5-500 MG per tablet Take 1 tablet by mouth every 6 (six) hours as needed.       Marland Kitchen lisinopril (PRINIVIL,ZESTRIL) 40 MG tablet Take 40 mg by mouth daily.      Marland Kitchen lithium carbonate (LITHOBID) 300 MG CR tablet Take 300 mg by mouth 2 (two) times daily.      . mupirocin ointment (BACTROBAN) 2 % Apply 1 application topically as needed.       . nitroGLYCERIN (NITROSTAT) 0.4 MG SL tablet Place 1 tablet (0.4 mg total) under the tongue every 5 (five) minutes x 3 doses as needed for chest pain.  25 tablet  3  . omeprazole (PRILOSEC) 20 MG capsule Take 20 mg by mouth daily.      . potassium chloride SA (K-DUR,KLOR-CON) 20 MEQ tablet Take 1 tablet (20 mEq total) by mouth now.  30 tablet  3  . propranolol (INDERAL LA) 60 MG 24 hr capsule Take 1 capsule (60 mg total) by mouth daily. ( UNKNOWN PRESCRIPTION )  30 capsule  11  . simvastatin (ZOCOR) 40 MG tablet Take 40 mg by mouth every evening.      . warfarin (COUMADIN) 5 MG tablet Take 5mg  alternating with 2.5mg  every other day as previously prescribed.  30 tablet  2    Assessment: 56 yo male admitted with SOB, h/o Afib, to continue anticoagulation   Goal of Therapy:  INR 2-3   Plan:  Coumadin 5 mg po tonight F/U daily INR  Kynadee Dam, Gary Fleet 06/25/2011,2:30 AM

## 2011-06-25 NOTE — Progress Notes (Signed)
PT Cancellation Note  Treatment cancelled today due to patient's refusal to participate.  Pt plans to leave hospital AMA. WIll discontinue PT order at this time.    Sheritha Louis 06/25/2011, 9:23 AM Theron Arista L. Enya Bureau DPT [phone]

## 2011-06-25 NOTE — ED Notes (Signed)
Pt sleeping.  Respirations even and unlabored.

## 2011-06-25 NOTE — ED Notes (Signed)
ED EKG done at 2232; placed in patient's chart.

## 2011-06-25 NOTE — Progress Notes (Signed)
Patient admitted after midnight.  Was sleepy then, now awake and want to leave AMA,  Will D/C foley. Sean Warren

## 2011-06-25 NOTE — ED Notes (Signed)
Patient urinated on self again; cleaned patient and changed linens.  Per Dr. Fonnie Jarvis, verbal order to place Foley Catheter.  Patient states that he is unable to notify staff when he has to urinate -- patient has urge to urinate every 5-10 minutes.

## 2011-06-29 NOTE — Discharge Summary (Signed)
On reviewing hospital records, patient left AMA the next day. No further information available.

## 2011-07-01 LAB — CULTURE, BLOOD (ROUTINE X 2)
Culture  Setup Time: 201304080821
Culture: NO GROWTH

## 2011-09-06 ENCOUNTER — Ambulatory Visit: Payer: Medicare Other | Admitting: Cardiology

## 2011-09-12 ENCOUNTER — Encounter: Payer: Self-pay | Admitting: Cardiology

## 2011-11-02 ENCOUNTER — Ambulatory Visit: Payer: Medicare Other | Admitting: Cardiology

## 2012-01-02 ENCOUNTER — Observation Stay (HOSPITAL_COMMUNITY)
Admission: EM | Admit: 2012-01-02 | Discharge: 2012-01-03 | Disposition: A | Discharge status: Home / Self Care | Payer: Medicare Other | Source: Ambulatory Visit | Attending: Internal Medicine | Admitting: Internal Medicine

## 2012-01-02 ENCOUNTER — Emergency Department (HOSPITAL_COMMUNITY): Payer: Medicare Other

## 2012-01-02 ENCOUNTER — Encounter (HOSPITAL_COMMUNITY): Payer: Self-pay | Admitting: *Deleted

## 2012-01-02 DIAGNOSIS — E785 Hyperlipidemia, unspecified: Secondary | ICD-10-CM

## 2012-01-02 DIAGNOSIS — G4733 Obstructive sleep apnea (adult) (pediatric): Secondary | ICD-10-CM

## 2012-01-02 DIAGNOSIS — I4891 Unspecified atrial fibrillation: Secondary | ICD-10-CM | POA: Insufficient documentation

## 2012-01-02 DIAGNOSIS — R0902 Hypoxemia: Secondary | ICD-10-CM

## 2012-01-02 DIAGNOSIS — I639 Cerebral infarction, unspecified: Secondary | ICD-10-CM

## 2012-01-02 DIAGNOSIS — J40 Bronchitis, not specified as acute or chronic: Secondary | ICD-10-CM | POA: Diagnosis present

## 2012-01-02 DIAGNOSIS — K219 Gastro-esophageal reflux disease without esophagitis: Secondary | ICD-10-CM | POA: Insufficient documentation

## 2012-01-02 DIAGNOSIS — Z6839 Body mass index (BMI) 39.0-39.9, adult: Secondary | ICD-10-CM | POA: Insufficient documentation

## 2012-01-02 DIAGNOSIS — R059 Cough, unspecified: Secondary | ICD-10-CM | POA: Insufficient documentation

## 2012-01-02 DIAGNOSIS — I509 Heart failure, unspecified: Secondary | ICD-10-CM | POA: Insufficient documentation

## 2012-01-02 DIAGNOSIS — F319 Bipolar disorder, unspecified: Secondary | ICD-10-CM | POA: Insufficient documentation

## 2012-01-02 DIAGNOSIS — J449 Chronic obstructive pulmonary disease, unspecified: Secondary | ICD-10-CM

## 2012-01-02 DIAGNOSIS — I480 Paroxysmal atrial fibrillation: Secondary | ICD-10-CM

## 2012-01-02 DIAGNOSIS — J189 Pneumonia, unspecified organism: Secondary | ICD-10-CM

## 2012-01-02 DIAGNOSIS — J441 Chronic obstructive pulmonary disease with (acute) exacerbation: Secondary | ICD-10-CM

## 2012-01-02 DIAGNOSIS — Z720 Tobacco use: Secondary | ICD-10-CM

## 2012-01-02 DIAGNOSIS — I5031 Acute diastolic (congestive) heart failure: Principal | ICD-10-CM | POA: Insufficient documentation

## 2012-01-02 DIAGNOSIS — I10 Essential (primary) hypertension: Secondary | ICD-10-CM

## 2012-01-02 DIAGNOSIS — E669 Obesity, unspecified: Secondary | ICD-10-CM | POA: Insufficient documentation

## 2012-01-02 DIAGNOSIS — R0602 Shortness of breath: Secondary | ICD-10-CM | POA: Insufficient documentation

## 2012-01-02 DIAGNOSIS — E119 Type 2 diabetes mellitus without complications: Secondary | ICD-10-CM | POA: Insufficient documentation

## 2012-01-02 DIAGNOSIS — E876 Hypokalemia: Secondary | ICD-10-CM

## 2012-01-02 DIAGNOSIS — R05 Cough: Secondary | ICD-10-CM | POA: Insufficient documentation

## 2012-01-02 LAB — BASIC METABOLIC PANEL
Chloride: 103 mEq/L (ref 96–112)
GFR calc Af Amer: 72 mL/min — ABNORMAL LOW (ref >90)
GFR calc non Af Amer: 62 mL/min — ABNORMAL LOW (ref >90)
Potassium: 4.4 mEq/L (ref 3.5–5.1)
Sodium: 141 mEq/L (ref 135–145)

## 2012-01-02 LAB — CBC
HCT: 45.3 % (ref 39.0–52.0)
Hemoglobin: 14.7 g/dL (ref 13.0–17.0)
RBC: 4.77 MIL/uL (ref 4.22–5.81)
WBC: 8.4 10*3/uL (ref 4.0–10.5)

## 2012-01-02 LAB — TROPONIN I: Troponin I: <0.3 ng/mL (ref <0.30)

## 2012-01-02 LAB — PRO B NATRIURETIC PEPTIDE: Pro B Natriuretic peptide (BNP): 1881 pg/mL — ABNORMAL HIGH (ref 0–125)

## 2012-01-02 MED ORDER — IPRATROPIUM BROMIDE 0.02 % IN SOLN
0.5000 mg | Freq: Once | RESPIRATORY_TRACT | Status: AC
  Administered 2012-01-02: 0.5 mg via RESPIRATORY_TRACT
  Filled 2012-01-02: qty 2.5

## 2012-01-02 MED ORDER — LEVALBUTEROL HCL 1.25 MG/0.5ML IN NEBU
1.2500 mg | INHALATION_SOLUTION | Freq: Once | RESPIRATORY_TRACT | Status: AC
  Administered 2012-01-02: 1.25 mg via RESPIRATORY_TRACT
  Filled 2012-01-02: qty 0.5

## 2012-01-02 NOTE — ED Notes (Signed)
Patient complaining of shortness of breath. States he has a history of a-fib and feels short of breath "when it acts up." Denies chest pain.

## 2012-01-02 NOTE — ED Provider Notes (Cosign Needed)
History    This chart was scribed for [name], MD, MD by [name]. The patient was seen in room APA18 and the patient's care was started at 10:42PM.  CSN: [id number]  Arrival date & time 01/02/12  2208      Chief Complaint  Patient presents with  . Shortness of Breath    (Consider location/radiation/quality/duration/timing/severity/associated sxs/prior treatment) Patient is a 56 y.o. male presenting with shortness of breath. The history is provided by the patient. No language interpreter was used.  Shortness of Breath  Associated symptoms include cough and shortness of breath.   Sean Warren is a 56 y.o. male who presents to the Emergency Department complaining of constant, moderate SOB onset 1 day ago. Pt reports that he has hx of a-fib. Denies chest pain, nausea, vomiting. He describes diaphoresis. Reports SOB is aggravated by walking. Denies using O2 at home. Coumadin  INR level at home today was 3. He reports he used to have problems with his atrophic been too fast but now he has a problem with it being too slow. He states they have not discussed pacemaker with him. Patient also states she's had a cough with clear sometimes yellow sputum production for the past month.   Pt reports that he smokes. Reports swelling of bilateral legs and abdomen. He has productive cough with yellow sputum. He denies using steroids in past for breathing problems.   PCP is Dr. Carolee Rota   Past Medical History  Diagnosis Date  . Hypertension   . Diabetes mellitus   . Bipolar 1 disorder   . Hyperlipidemia   . Atrial fibrillation   . COPD (chronic obstructive pulmonary disease)   . Obstructive sleep apnea   . Chronic diastolic heart failure     Echocardiogram 05/14/11: Mild LVH, EF 50-55%, no wall motion abnormalities, grade 2 diastolic dysfunction.  . Chest pain     Myoview 05/17/11 demonstrated an EF of 61%, small fixed inferoseptal defect consistent with thinning vs prior  infarct; no ischemia. - low risk  . Diabetic neuropathy   . Chronic back pain   . History of stroke   . Mental disorder     bipolar  . CHF (congestive heart failure)   . Stroke   . Neuromuscular disorder     diabetic neuropathy    Past Surgical History  Procedure Date  . No past surgeries     No family history on file.  History  Substance Use Topics  . Smoking status: Current Every Day Smoker -- 1.5 packs/day for 29 years    Types: Cigarettes  . Smokeless tobacco: Never Used  . Alcohol Use: No   Lives with roommate Lives at home   Review of Systems  Respiratory: Positive for cough and shortness of breath.   All other systems reviewed and are negative.    Allergies  Muscle relief  Home Medications   Current Outpatient Rx  Name Route Sig Dispense Refill  . ASPIRIN EC 81 MG PO TBEC Oral Take 81 mg by mouth daily.    Marland Kitchen CLONIDINE HCL 0.2 MG PO TABS Oral Take 0.2 mg by mouth 2 (two) times daily.     Marland Kitchen ESOMEPRAZOLE MAGNESIUM 20 MG PO CPDR Oral Take 20 mg by mouth daily before breakfast.    . FUROSEMIDE 40 MG PO TABS Oral Take 40 mg by mouth daily.    Marland Kitchen GABAPENTIN 600 MG PO TABS Oral Take 600 mg by mouth 3 (three) times daily.    Marland Kitchen  GLIMEPIRIDE 2 MG PO TABS Oral Take 2 mg by mouth daily before breakfast.    . HYDROCODONE-ACETAMINOPHEN 10-325 MG PO TABS Oral Take 1 tablet by mouth every 6 (six) hours as needed. For pain    . LISINOPRIL 40 MG PO TABS Oral Take 40 mg by mouth daily.    Marland Kitchen LITHIUM CARBONATE ER 300 MG PO TBCR Oral Take 300 mg by mouth 2 (two) times daily.    Marland Kitchen NITROGLYCERIN 0.4 MG SL SUBL Sublingual Place 0.4 mg under the tongue every 5 (five) minutes as needed. For chest pain    . OMEPRAZOLE 20 MG PO CPDR Oral Take 20 mg by mouth daily.    Marland Kitchen POTASSIUM CHLORIDE CRYS ER 20 MEQ PO TBCR Oral Take 20 mEq by mouth daily.    Marland Kitchen PROPRANOLOL HCL ER 60 MG PO CP24 Oral Take 60 mg by mouth daily.    . QUETIAPINE FUMARATE 100 MG PO TABS Oral Take 100 mg by mouth at  bedtime.    Marland Kitchen SIMVASTATIN 80 MG PO TABS Oral Take 80 mg by mouth at bedtime.    . WARFARIN SODIUM 5 MG PO TABS Oral Take 2.5-5 mg by mouth daily. Take 5mg  on day 1, 2.5mg  on day 2, 5mg  on day 3, and continue accordingly      BP 154/120  Pulse 100  Temp 98.3 F (36.8 C) (Oral)  Ht 5\' 8"  (1.727 m)  Wt 253 lb (114.76 kg)  BMI 38.47 kg/m2  SpO2 88%  Vital signs normal except hypertension    Physical Exam  Nursing note and vitals reviewed. Constitutional: He is oriented to person, place, and time. He appears well-developed and well-nourished.  Non-toxic appearance. He does not appear ill. No distress.  HENT:  Head: Normocephalic and atraumatic.  Right Ear: External ear normal.  Left Ear: External ear normal.  Nose: Nose normal. No mucosal edema or rhinorrhea.  Mouth/Throat: Oropharynx is clear and moist and mucous membranes are normal. No dental abscesses or uvula swelling.  Eyes: Conjunctivae normal and EOM are normal. Pupils are equal, round, and reactive to light.  Neck: Normal range of motion and full passive range of motion without pain. Neck supple.  Cardiovascular: Normal rate, regular rhythm and normal heart sounds.  Exam reveals no gallop and no friction rub.   No murmur heard. Pulmonary/Chest: Effort normal. No respiratory distress. He has wheezes (expiratory ). He has rhonchi (diffuse). He has no rales. He exhibits no tenderness and no crepitus.  Abdominal: Soft. Normal appearance and bowel sounds are normal. He exhibits no distension. There is no tenderness. There is no rebound and no guarding.  Musculoskeletal: Normal range of motion. He exhibits edema (+1 bilateral lower extremity up to knees). He exhibits no tenderness.       Moves all extremities well.   Neurological: He is alert and oriented to person, place, and time. He has normal strength. No cranial nerve deficit.  Skin: Skin is warm, dry and intact. No rash noted. No erythema. No pallor.  Psychiatric: He has a  normal mood and affect. His speech is normal and behavior is normal. His mood appears not anxious.    ED Course  Procedures (including critical care time) DIAGNOSTIC STUDIES: Oxygen Saturation is 88% on [place], low by my interpretation.    COORDINATION OF CARE: 10:49 PM Discussed ED treatment with pt  10:49 PM Ordered:   Medications  moxifloxacin (AVELOX) IVPB 400 mg (not administered)  ipratropium (ATROVENT) nebulizer solution 0.5 mg (0.5 mg  Nebulization Given 01/02/12 2304)  levalbuterol (XOPENEX) nebulizer solution 1.25 mg (1.25 mg Nebulization Given 01/02/12 2304)  HYDROcodone-acetaminophen (NORCO/VICODIN) 5-325 MG per tablet (2 tablet  Given 01/03/12 0101)   monitor shows atrial fibrillation with rate 70-80.   Recheck after nebulizer shows improved aeration and scattered rhonchi wheezing is gone.  Patient initially stated he wanted to go home. States "the cardiologist never do anything for me ". On reviewing his prior hospitalization he left the hospital AMA in February and in April.  Shortly afterwards patient decided he wanted to be admitted. He was ambulated by nursing staff and his pulse ox was noted to drop to 86% after he walked in he started having worsening trouble breathing again. Patient states he feels like he needs to be admitted.  01:06 Dr Orvan Falconer, admit to telemetry   Results for orders placed during the hospital encounter of 01/02/12  BASIC METABOLIC PANEL      Component Value Range   Sodium 141  135 - 145 mEq/L   Potassium 4.4  3.5 - 5.1 mEq/L   Chloride 103  96 - 112 mEq/L   CO2 32  19 - 32 mEq/L   Glucose, Bld 171 (*) 70 - 99 mg/dL   BUN 14  6 - 23 mg/dL   Creatinine, Ser 1.61  0.50 - 1.35 mg/dL   Calcium 9.6  8.4 - 09.6 mg/dL   GFR calc non Af Amer 62 (*) >90 mL/min   GFR calc Af Amer 72 (*) >90 mL/min  CBC      Component Value Range   WBC 8.4  4.0 - 10.5 K/uL   RBC 4.77  4.22 - 5.81 MIL/uL   Hemoglobin 14.7  13.0 - 17.0 g/dL   HCT 04.5  40.9 - 81.1  %   MCV 95.0  78.0 - 100.0 fL   MCH 30.8  26.0 - 34.0 pg   MCHC 32.5  30.0 - 36.0 g/dL   RDW 91.4 (*) 78.2 - 95.6 %   Platelets 248  150 - 400 K/uL  PRO B NATRIURETIC PEPTIDE      Component Value Range   Pro B Natriuretic peptide (BNP) 1881.0 (*) 0 - 125 pg/mL  TROPONIN I      Component Value Range   Troponin I <0.30  <0.30 ng/mL   Laboratory interpretation all normal except for hyperglycemia, mildly elevated BNP but has been higher   Dg Chest Port 1 View  01/02/2012  *RADIOLOGY REPORT*  Clinical Data: Shortness of breath  PORTABLE CHEST - 1 VIEW  Comparison: 06/25/2011 from [facility]  Findings: Cardiac enlargement with normal pulmonary vascularity. Improving airspace disease in the right lung base.  No new consolidation.  No blunting of costophrenic angles.  No pneumothorax. Mediastinal contours appear intact.  IMPRESSION: Improving right lung base infiltration.  Cardiac enlargement.   Original Report Authenticated By: Marlon Pel, M.D.      Date: 01/03/2012  Rate: 80  Rhythm: atrial fibrillation  QRS Axis: normal  Intervals: normal  ST/T Wave abnormalities: nonspecific ST/T changes  Conduction Disutrbances:IRBBB  Narrative Interpretation:   Old EKG Reviewed: none available    1. COPD with acute exacerbation   2. Bronchitis   3. Hypoxia   4. Atrial fibrillation     Plan admission  Devoria Albe, MD, FACEP   MDM   I personally performed the services described in this documentation, which was scribed in my presence. The recorded information has been reviewed and considered.  Devoria Albe,  MD, Franz Dell, MD 01/03/12 [phone]

## 2012-01-03 ENCOUNTER — Observation Stay (HOSPITAL_COMMUNITY)
Admission: EM | Admit: 2012-01-03 | Discharge: 2012-01-04 | Disposition: A | Discharge status: Home Health | Payer: Medicare Other | Source: Ambulatory Visit | Attending: Internal Medicine | Admitting: Internal Medicine

## 2012-01-03 ENCOUNTER — Encounter (HOSPITAL_COMMUNITY): Payer: Self-pay | Admitting: *Deleted

## 2012-01-03 ENCOUNTER — Emergency Department (HOSPITAL_COMMUNITY): Payer: Medicare Other

## 2012-01-03 ENCOUNTER — Other Ambulatory Visit: Payer: Self-pay

## 2012-01-03 DIAGNOSIS — Z720 Tobacco use: Secondary | ICD-10-CM | POA: Diagnosis present

## 2012-01-03 DIAGNOSIS — I639 Cerebral infarction, unspecified: Secondary | ICD-10-CM

## 2012-01-03 DIAGNOSIS — E119 Type 2 diabetes mellitus without complications: Secondary | ICD-10-CM | POA: Diagnosis present

## 2012-01-03 DIAGNOSIS — R0602 Shortness of breath: Secondary | ICD-10-CM | POA: Insufficient documentation

## 2012-01-03 DIAGNOSIS — F319 Bipolar disorder, unspecified: Secondary | ICD-10-CM | POA: Diagnosis present

## 2012-01-03 DIAGNOSIS — Z7901 Long term (current) use of anticoagulants: Secondary | ICD-10-CM | POA: Insufficient documentation

## 2012-01-03 DIAGNOSIS — E785 Hyperlipidemia, unspecified: Secondary | ICD-10-CM

## 2012-01-03 DIAGNOSIS — R05 Cough: Secondary | ICD-10-CM | POA: Insufficient documentation

## 2012-01-03 DIAGNOSIS — K219 Gastro-esophageal reflux disease without esophagitis: Secondary | ICD-10-CM | POA: Insufficient documentation

## 2012-01-03 DIAGNOSIS — R0601 Orthopnea: Secondary | ICD-10-CM | POA: Insufficient documentation

## 2012-01-03 DIAGNOSIS — G4733 Obstructive sleep apnea (adult) (pediatric): Secondary | ICD-10-CM | POA: Diagnosis present

## 2012-01-03 DIAGNOSIS — R0902 Hypoxemia: Secondary | ICD-10-CM | POA: Diagnosis present

## 2012-01-03 DIAGNOSIS — I509 Heart failure, unspecified: Secondary | ICD-10-CM | POA: Insufficient documentation

## 2012-01-03 DIAGNOSIS — E669 Obesity, unspecified: Secondary | ICD-10-CM | POA: Diagnosis present

## 2012-01-03 DIAGNOSIS — J40 Bronchitis, not specified as acute or chronic: Secondary | ICD-10-CM

## 2012-01-03 DIAGNOSIS — F17200 Nicotine dependence, unspecified, uncomplicated: Secondary | ICD-10-CM | POA: Insufficient documentation

## 2012-01-03 DIAGNOSIS — I4891 Unspecified atrial fibrillation: Secondary | ICD-10-CM | POA: Insufficient documentation

## 2012-01-03 DIAGNOSIS — I10 Essential (primary) hypertension: Secondary | ICD-10-CM

## 2012-01-03 DIAGNOSIS — I5031 Acute diastolic (congestive) heart failure: Principal | ICD-10-CM | POA: Insufficient documentation

## 2012-01-03 DIAGNOSIS — I480 Paroxysmal atrial fibrillation: Secondary | ICD-10-CM

## 2012-01-03 DIAGNOSIS — J189 Pneumonia, unspecified organism: Secondary | ICD-10-CM

## 2012-01-03 DIAGNOSIS — Z6838 Body mass index (BMI) 38.0-38.9, adult: Secondary | ICD-10-CM | POA: Insufficient documentation

## 2012-01-03 DIAGNOSIS — E876 Hypokalemia: Secondary | ICD-10-CM

## 2012-01-03 DIAGNOSIS — J441 Chronic obstructive pulmonary disease with (acute) exacerbation: Secondary | ICD-10-CM

## 2012-01-03 DIAGNOSIS — J449 Chronic obstructive pulmonary disease, unspecified: Secondary | ICD-10-CM

## 2012-01-03 DIAGNOSIS — R059 Cough, unspecified: Secondary | ICD-10-CM | POA: Insufficient documentation

## 2012-01-03 LAB — CBC
MCHC: 31.6 g/dL (ref 30.0–36.0)
Platelets: 242 10*3/uL (ref 150–400)
RDW: 16.2 % — ABNORMAL HIGH (ref 11.5–15.5)
WBC: 8.6 10*3/uL (ref 4.0–10.5)

## 2012-01-03 LAB — BLOOD GAS, ARTERIAL
Acid-Base Excess: 5 mmol/L — ABNORMAL HIGH (ref 0.0–2.0)
Delivery systems: POSITIVE
Drawn by: 22223
Expiratory PAP: 8
FIO2: 75 %
Inspiratory PAP: 16
O2 Saturation: 99.7 %
Patient temperature: 37
RATE: 16 resp/min
pO2, Arterial: 312 mmHg — ABNORMAL HIGH (ref 80.0–100.0)

## 2012-01-03 LAB — CBC WITH DIFFERENTIAL/PLATELET
Basophils Absolute: 0.1 10*3/uL (ref 0.0–0.1)
HCT: 40.7 % (ref 39.0–52.0)
Hemoglobin: 13.3 g/dL (ref 13.0–17.0)
Lymphocytes Relative: 24 % (ref 12–46)
Monocytes Absolute: 0.4 10*3/uL (ref 0.1–1.0)
Neutro Abs: 5.5 10*3/uL (ref 1.7–7.7)
Neutrophils Relative %: 67 % (ref 43–77)
RDW: 16 % — ABNORMAL HIGH (ref 11.5–15.5)
WBC: 8.2 10*3/uL (ref 4.0–10.5)

## 2012-01-03 LAB — CREATININE, SERUM
GFR calc Af Amer: 76 mL/min — ABNORMAL LOW (ref >90)
GFR calc non Af Amer: 66 mL/min — ABNORMAL LOW (ref >90)

## 2012-01-03 LAB — GLUCOSE, CAPILLARY: Glucose-Capillary: 130 mg/dL — ABNORMAL HIGH (ref 70–99)

## 2012-01-03 LAB — BASIC METABOLIC PANEL
CO2: 32 mEq/L (ref 19–32)
Chloride: 100 mEq/L (ref 96–112)
Creatinine, Ser: 1.17 mg/dL (ref 0.50–1.35)
Potassium: 4 mEq/L (ref 3.5–5.1)

## 2012-01-03 LAB — MAGNESIUM: Magnesium: 2.2 mg/dL (ref 1.5–2.5)

## 2012-01-03 LAB — TSH: TSH: 3.88 u[IU]/mL (ref 0.350–4.500)

## 2012-01-03 LAB — PROTIME-INR: INR: 2.56 — ABNORMAL HIGH (ref 0.00–1.49)

## 2012-01-03 MED ORDER — IPRATROPIUM BROMIDE 0.02 % IN SOLN
0.5000 mg | Freq: Four times a day (QID) | RESPIRATORY_TRACT | Status: DC | PRN
  Administered 2012-01-03: 0.5 mg via RESPIRATORY_TRACT
  Filled 2012-01-03: qty 2.5

## 2012-01-03 MED ORDER — NITROGLYCERIN 0.4 MG SL SUBL: 0.4000 mg | SUBLINGUAL_TABLET | SUBLINGUAL | Status: DC | PRN

## 2012-01-03 MED ORDER — ATORVASTATIN CALCIUM 40 MG PO TABS: 40.0000 mg | ORAL_TABLET | Freq: Every day | ORAL | Status: DC

## 2012-01-03 MED ORDER — SODIUM CHLORIDE 0.9 % IV SOLN: 250.0000 mL | INTRAVENOUS | Status: DC | PRN

## 2012-01-03 MED ORDER — GABAPENTIN 300 MG PO CAPS
600.0000 mg | ORAL_CAPSULE | Freq: Three times a day (TID) | ORAL | Status: DC
Start: 2012-01-03 — End: 2012-01-03
  Administered 2012-01-03: 600 mg via ORAL
  Filled 2012-01-03: qty 2

## 2012-01-03 MED ORDER — POTASSIUM CHLORIDE CRYS ER 20 MEQ PO TBCR: 40.0000 meq | EXTENDED_RELEASE_TABLET | Freq: Two times a day (BID) | ORAL | Status: DC

## 2012-01-03 MED ORDER — WARFARIN SODIUM 2.5 MG PO TABS: 2.5000 mg | ORAL_TABLET | ORAL | Status: DC

## 2012-01-03 MED ORDER — MOXIFLOXACIN HCL IN NACL 400 MG/250ML IV SOLN
400.0000 mg | Freq: Once | INTRAVENOUS | Status: AC
  Administered 2012-01-03: 400 mg via INTRAVENOUS
  Filled 2012-01-03: qty 250

## 2012-01-03 MED ORDER — GUAIFENESIN ER 600 MG PO TB12
1200.0000 mg | ORAL_TABLET | Freq: Two times a day (BID) | ORAL | Status: DC
  Administered 2012-01-03: 1200 mg via ORAL
  Filled 2012-01-03: qty 2

## 2012-01-03 MED ORDER — HEPARIN SODIUM (PORCINE) 5000 UNIT/ML IJ SOLN
5000.0000 [IU] | Freq: Three times a day (TID) | INTRAMUSCULAR | Status: DC
  Administered 2012-01-03: 5000 [IU] via SUBCUTANEOUS
  Filled 2012-01-03: qty 1

## 2012-01-03 MED ORDER — ACETAMINOPHEN 325 MG PO TABS: 650.0000 mg | ORAL_TABLET | ORAL | Status: DC | PRN

## 2012-01-03 MED ORDER — GLIMEPIRIDE 2 MG PO TABS
2.0000 mg | ORAL_TABLET | Freq: Every day | ORAL | Status: DC
Start: 2012-01-03 — End: 2012-01-03
  Administered 2012-01-03: 2 mg via ORAL
  Filled 2012-01-03: qty 1

## 2012-01-03 MED ORDER — PANTOPRAZOLE SODIUM 40 MG PO TBEC: 40.0000 mg | DELAYED_RELEASE_TABLET | Freq: Every day | ORAL | Status: DC

## 2012-01-03 MED ORDER — WARFARIN SODIUM 5 MG PO TABS: 5.0000 mg | ORAL_TABLET | ORAL | Status: DC

## 2012-01-03 MED ORDER — MOXIFLOXACIN HCL 400 MG PO TABS: 400.0000 mg | ORAL_TABLET | Freq: Every day | ORAL | Status: DC

## 2012-01-03 MED ORDER — FUROSEMIDE 10 MG/ML IJ SOLN
40.0000 mg | Freq: Two times a day (BID) | INTRAMUSCULAR | Status: DC
Start: 2012-01-03 — End: 2012-01-03
  Administered 2012-01-03: 40 mg via INTRAVENOUS
  Filled 2012-01-03: qty 4

## 2012-01-03 MED ORDER — INSULIN ASPART 100 UNIT/ML ~~LOC~~ SOLN: 0.0000 [IU] | Freq: Every day | SUBCUTANEOUS | Status: DC

## 2012-01-03 MED ORDER — LISINOPRIL 10 MG PO TABS: 40.0000 mg | ORAL_TABLET | Freq: Every day | ORAL | Status: DC

## 2012-01-03 MED ORDER — LITHIUM CARBONATE ER 300 MG PO TBCR
300.0000 mg | EXTENDED_RELEASE_TABLET | Freq: Two times a day (BID) | ORAL | Status: DC
  Filled 2012-01-03 (×3): qty 1

## 2012-01-03 MED ORDER — LEVOFLOXACIN 500 MG PO TABS: 500.0000 mg | ORAL_TABLET | Freq: Every day | ORAL | Status: AC

## 2012-01-03 MED ORDER — WARFARIN - PHARMACIST DOSING INPATIENT: Freq: Every day | Status: DC

## 2012-01-03 MED ORDER — CLONIDINE HCL 0.2 MG PO TABS: 0.2000 mg | ORAL_TABLET | Freq: Two times a day (BID) | ORAL | Status: DC

## 2012-01-03 MED ORDER — ONDANSETRON HCL 4 MG/2ML IJ SOLN: 4.0000 mg | INTRAMUSCULAR | Status: DC | PRN

## 2012-01-03 MED ORDER — HYDROCODONE-ACETAMINOPHEN 10-325 MG PO TABS
1.0000 | ORAL_TABLET | Freq: Four times a day (QID) | ORAL | Status: DC | PRN
  Administered 2012-01-03: 1 via ORAL
  Filled 2012-01-03: qty 1

## 2012-01-03 MED ORDER — INSULIN ASPART 100 UNIT/ML ~~LOC~~ SOLN
0.0000 [IU] | Freq: Three times a day (TID) | SUBCUTANEOUS | Status: DC
  Administered 2012-01-03: 1 [IU] via SUBCUTANEOUS

## 2012-01-03 MED ORDER — SODIUM CHLORIDE 0.9 % IV SOLN: INTRAVENOUS | Status: DC

## 2012-01-03 MED ORDER — QUETIAPINE FUMARATE 300 MG PO TABS: 150.0000 mg | ORAL_TABLET | Freq: Every day | ORAL | Status: DC

## 2012-01-03 MED ORDER — HYDROCODONE-ACETAMINOPHEN 5-325 MG PO TABS
ORAL_TABLET | ORAL | Status: AC
  Administered 2012-01-03: 2
  Filled 2012-01-03: qty 2

## 2012-01-03 MED ORDER — SODIUM CHLORIDE 0.9 % IJ SOLN
3.0000 mL | Freq: Two times a day (BID) | INTRAMUSCULAR | Status: DC
  Filled 2012-01-03: qty 3

## 2012-01-03 MED ORDER — ASPIRIN EC 81 MG PO TBEC: 81.0000 mg | DELAYED_RELEASE_TABLET | Freq: Every day | ORAL | Status: DC

## 2012-01-03 MED ORDER — HYDROCODONE-ACETAMINOPHEN 5-325 MG PO TABS
2.0000 | ORAL_TABLET | Freq: Once | ORAL | Status: AC
  Administered 2012-01-03: 2 via ORAL
  Filled 2012-01-03: qty 2

## 2012-01-03 MED ORDER — LEVALBUTEROL HCL 1.25 MG/0.5ML IN NEBU
1.2500 mg | INHALATION_SOLUTION | Freq: Four times a day (QID) | RESPIRATORY_TRACT | Status: DC
  Administered 2012-01-03: 1.25 mg via RESPIRATORY_TRACT
  Filled 2012-01-03 (×2): qty 0.5

## 2012-01-03 MED ORDER — PROPRANOLOL HCL ER 60 MG PO CP24
60.0000 mg | ORAL_CAPSULE | Freq: Every day | ORAL | Status: DC
  Filled 2012-01-03 (×2): qty 1

## 2012-01-03 MED ORDER — SODIUM CHLORIDE 0.9 % IJ SOLN: 3.0000 mL | INTRAMUSCULAR | Status: DC | PRN

## 2012-01-03 NOTE — ED Notes (Signed)
Pt discharged from hospital earlier today, felt increasing SOB, on 10cm cpap per ems

## 2012-01-03 NOTE — H&P (Signed)
[facility] History and Physical  Sean Warren  ZOX:[id number]  DOB: 02/24/1956   DOA: 01/03/2012   PCP:   [name], MD   Chief Complaint:  Shortness or breath with exertion for one week  HPI: Sean Warren is an 56 y.o. male.   History of bipolar disorder, diastolic heart failure, tobacco abuse and COPD, who has a history of repeatedly  leaving  medical care  AGAINST MEDICAL ADVICE, against again tonight with the above symptoms. He complains also of fever, chills and cough productive of yellow sputum. On evaluation in the emergency room patient was maintained his oxygenation while ambulating at the end of his walk his oxygen saturation dropped to 86% smokeless service was called to assist.  He also complains of palpitations but does have a history of paroxysmal A. Fib.  Patient denies any other new symptoms; he does have chronic lower extremity edema.  Rewiew of Systems:   All systems negative except as marked bold or noted in the HPI;  Constitutional: Negative for malaise, ;  Eyes: Negative for eye pain, redness and discharge. ;  ENMT: Negative for ear pain, hoarseness, Cardiovascular: Negative for chest pain,  diaphoresis, dyspnea ;  Respiratory: Negative for hemoptysis, nd stridor. ;  Gastrointestinal: Negative for nausea, vomiting, diarrhea, constipation, abdominal pain, melena, blood in stool, hematemesis, jaundice and rectal bleeding. unusual weight loss..   Genitourinary: Negative for frequency, dysuria, incontinence,flank pain and hematuria; Musculoskeletal:  Negative for swelling and trauma.;  Skin: . Negative for pruritus, rash, abrasions, bruising and skin lesion.; ulcerations Neuro: Negative for headache, lightheadedness and neck stiffness. Negative for weakness, altered level of consciousness , altered mental status, extremity weakness, burning feet, involuntary movement, seizure and syncope.  Psych: negative for anxiety, insomnia, tearfulness, panic  attacks, hallucinations, paranoia, suicidal or homicidal ideation    Past Medical History  Diagnosis Date  . Hypertension   . Diabetes mellitus   . Bipolar 1 disorder   . Hyperlipidemia   . Atrial fibrillation   . COPD (chronic obstructive pulmonary disease)   . Obstructive sleep apnea   . Chronic diastolic heart failure     Echocardiogram 05/14/11: Mild LVH, EF 50-55%, no wall motion abnormalities, grade 2 diastolic dysfunction.  . Chest pain     Myoview 05/17/11 demonstrated an EF of 61%, small fixed inferoseptal defect consistent with thinning vs prior infarct; no ischemia. - low risk  . Diabetic neuropathy   . Chronic back pain   . History of stroke   . Mental disorder     bipolar  . CHF (congestive heart failure)   . Stroke   . Neuromuscular disorder     diabetic neuropathy    Past Surgical History  Procedure Date  . No past surgeries     Medications:  HOME MEDS: Prior to Admission medications   Medication Sig Start Date End Date Taking? Authorizing Provider  albuterol (PROVENTIL HFA;VENTOLIN HFA) 108 (90 BASE) MCG/ACT inhaler Inhale 2 puffs into the lungs every 6 (six) hours as needed. For shortness of breath   Yes Historical Provider, MD  albuterol (PROVENTIL) (2.5 MG/3ML) 0.083% nebulizer solution Take 2.5 mg by nebulization 3 (three) times daily as needed. For shortness of breath   Yes Historical Provider, MD  aspirin EC 81 MG tablet Take 81 mg by mouth daily.   Yes Historical Provider, MD  cloNIDine (CATAPRES) 0.2 MG tablet Take 0.2 mg by mouth 2 (two) times daily.  05/16/11  Yes Historical Provider, MD  furosemide (LASIX) 40  MG tablet Take 40 mg by mouth daily.   Yes Historical Provider, MD  gabapentin (NEURONTIN) 600 MG tablet Take 600 mg by mouth 3 (three) times daily.   Yes Historical Provider, MD  glimepiride (AMARYL) 2 MG tablet Take 2 mg by mouth daily before breakfast.   Yes Historical Provider, MD  HYDROcodone-acetaminophen (NORCO) 10-325 MG per tablet Take  1 tablet by mouth every 6 (six) hours as needed. For pain   Yes Historical Provider, MD  lisinopril (PRINIVIL,ZESTRIL) 40 MG tablet Take 40 mg by mouth daily.   Yes Historical Provider, MD  lithium carbonate (LITHOBID) 300 MG CR tablet Take 300 mg by mouth 2 (two) times daily.   Yes Historical Provider, MD  omeprazole (PRILOSEC) 20 MG capsule Take 20 mg by mouth daily.   Yes Historical Provider, MD  potassium chloride (K-DUR) 10 MEQ tablet Take 10 mEq by mouth daily.   Yes Historical Provider, MD  propranolol ER (INDERAL LA) 60 MG 24 hr capsule Take 60 mg by mouth daily.   Yes Historical Provider, MD  QUEtiapine (SEROQUEL) 300 MG tablet Take 150-300 mg by mouth at bedtime.   Yes Historical Provider, MD  simvastatin (ZOCOR) 80 MG tablet Take 80 mg by mouth at bedtime.   Yes Historical Provider, MD  warfarin (COUMADIN) 5 MG tablet Take 2.5-5 mg by mouth daily. Take 5mg  on day 1, 2.5mg  on day 2, 5mg  on day 3, and continue accordingly   Yes Historical Provider, MD  nitroGLYCERIN (NITROSTAT) 0.4 MG SL tablet Place 0.4 mg under the tongue every 5 (five) minutes as needed. For chest pain    Historical Provider, MD     Allergies:  Allergies  Allergen Reactions  . Muscle Relief (Capsaicin)     Hysteria     Social History:   reports that he has been smoking Cigarettes.  He has a 43.5 pack-year smoking history. He has never used smokeless tobacco. He reports that he does not drink alcohol or use illicit drugs.  Family History: History reviewed. No pertinent family history.   Physical Exam: Filed Vitals:   01/02/12 2345 01/03/12 0115 01/03/12 0130 01/03/12 0251  BP:    135/109  Pulse: 85  87   Temp:    97.6 F (36.4 C)  TempSrc:    Oral  Resp: 20 21 19 20   Height:    5\' 8"  (1.727 m)  Weight:    117.255 kg (258 lb 8 oz)  SpO2: 100%  97% 98%   Blood pressure 135/109, pulse 87, temperature 97.6 F (36.4 C), temperature source Oral, resp. rate 20, height 5\' 8"  (1.727 m), weight 117.255 kg  (258 lb 8 oz), SpO2 98.00%.  GEN:  Pleasant middle-aged obese Caucasian gentleman sitting up in the stretcher in no acute distress; cooperative with exam PSYCH:  alert and oriented x4; does not appear anxious or depressed; affect is flat HEENT: Mucous membranes pink and anicteric; PERRLA; EOM intact; no cervical lymphadenopathy nor thyromegaly or carotid bruit; thick neck Breasts:: Not examined CHEST WALL: No tenderness CHEST: Prolonged expiration; mild diffuse expiratory rhonchi HEART: Irregularly irregular rhythm; no murmurs rubs or gallops BACK: No kyphosis or scoliosis; no CVA tenderness ABDOMEN: Obese, distended by flat,  non-tender;  normal abdominal bowel sounds; large pannus; no intertriginous candida. Rectal Exam: Not done EXTREMITIES: ; age-appropriate arthropathy of the hands and knees; 3+edema; no ulcerations. Warm dusky extremities due to venous congestion Genitalia: not examined PULSES: 2+ and symmetric SKIN: Normal hydration no rash or ulceration CNS:  Cranial nerves 2-12 grossly intact no focal lateralizing neurologic deficit   Labs on Admission:  Basic Metabolic Panel:  Lab 01/02/12 1610  NA 141  K 4.4  CL 103  CO2 32  GLUCOSE 171*  BUN 14  CREATININE 1.24  CALCIUM 9.6  MG --  PHOS --   Liver Function Tests: No results found for this basename: AST:5,ALT:5,ALKPHOS:5,BILITOT:5,PROT:5,ALBUMIN:5 in the last 168 hours No results found for this basename: LIPASE:5,AMYLASE:5 in the last 168 hours No results found for this basename: AMMONIA:5 in the last 168 hours CBC:  Lab 01/02/12 2219  WBC 8.4  NEUTROABS --  HGB 14.7  HCT 45.3  MCV 95.0  PLT 248   Cardiac Enzymes:  Lab 01/02/12 2238  CKTOTAL --  CKMB --  CKMBINDEX --  TROPONINI <0.30   BNP: No components found with this basename: POCBNP:5 D-dimer: No components found with this basename: D-DIMER:5 CBG: No results found for this basename: GLUCAP:5 in the last 168 hours  Radiological Exams on  Admission: Dg Chest Port 1 View  01/02/2012  *RADIOLOGY REPORT*  Clinical Data: Shortness of breath  PORTABLE CHEST - 1 VIEW  Comparison: 06/25/2011 from [facility]  Findings: Cardiac enlargement with normal pulmonary vascularity. Improving airspace disease in the right lung base.  No new consolidation.  No blunting of costophrenic angles.  No pneumothorax. Mediastinal contours appear intact.  IMPRESSION: Improving right lung base infiltration.  Cardiac enlargement.   Original Report Authenticated By: Marlon Pel, M.D.     EKG: Independently reviewed. A  fibrillation with a controlled rate; no acuity she may change in   Assessment/Plan Present on Admission:  .Hypoxia Bronchitis .CHF (congestive heart failure) .COPD with acute exacerbation  since the exact cause of his hypoxia is unclear as likely multifactorial, we'll give nebulizer patient's with Xopenex and ipratropium, and antibiotic coverage for bronchitis; his wheezing does not seem sufficiently severe for steroids especially given his diabetes; his BNP is elevated, has marked lower extremity edema, is not following up closely with his cardiologist, will optimize his cardiac medications, and recheck his ejection fraction.  .Bipolar disorder  Check lithium level and continue her home meds  .DM (diabetes mellitus)  Continue home meds and add sliding scale while in hospital  .Paroxysmal a-fib  Check INR and have pharmacy dose his warfarin  .Obesity  Will need further counseling on diet and exercise;    .Tobacco abuse  Counseled on nicotine cessation   Other plans as per orders.  Code Status: FULL CODE  Family Communication: Plans discussed with patient Disposition Plan: Cardiology evaluation in the morning if patient is not signed out AMA.   Haydyn Girvan Nocturnist [facility] Pager [phone]  01/03/2012, 4:21 AM

## 2012-01-03 NOTE — ED Notes (Signed)
Patient ambulated around nursing station. Patients maintained between 93%-96% on room air while walking. Patients oxygen saturation dropped to 86% at the end of the walk.

## 2012-01-03 NOTE — Progress Notes (Signed)
Patient taken off BIPAP following ABG;placed on 3lpm Gila;nurse was notified of changed.

## 2012-01-03 NOTE — ED Provider Notes (Addendum)
History  This chart was scribed for Shelda Jakes, MD by Ladona Ridgel Day. This patient was seen in room APA04/APA04 and the patient's care was started at 2036.  CSN: [id number]  Arrival date & time 01/03/12  2036   First MD Initiated Contact with Patient 01/03/12 2050      Chief Complaint  Patient presents with  . Shortness of Breath   Patient is a 56 y.o. male presenting with shortness of breath. The history is provided by the patient. No language interpreter was used.  Shortness of Breath  The current episode started today. The problem occurs continuously. The problem has been unchanged. The problem is moderate. Nothing relieves the symptoms. The symptoms are aggravated by a supine position. Associated symptoms include orthopnea, cough and shortness of breath. Pertinent negatives include no chest pain and no fever. Recently, medical care has been given at this facility and by EMS. Services received include medications given and tests performed.   Sean Warren is a 56 y.o. male brought in by ambulance, who presents to the Emergency Department w/hx of COPD complaining of constant gradually worsening SOB since he was D/C from hospital yesterday for COPD exacerbation but his SOB worsened this PM and his roommate called EMS who initiated C-pap en route which has moderately alleviated his SOB. He states SOB worse with laying flat and mildly alleviated with sitting up. He does not have 02 at home but has used it before. He also c/o of a non productive cough but denies fever, nausea, emesis, CP, abdominal pain.  Past Medical History  Diagnosis Date  . Hypertension   . Diabetes mellitus   . Bipolar 1 disorder   . Hyperlipidemia   . Atrial fibrillation   . COPD (chronic obstructive pulmonary disease)   . Obstructive sleep apnea   . Chronic diastolic heart failure     Echocardiogram 05/14/11: Mild LVH, EF 50-55%, no wall motion abnormalities, grade 2 diastolic dysfunction.  . Chest pain    Myoview 05/17/11 demonstrated an EF of 61%, small fixed inferoseptal defect consistent with thinning vs prior infarct; no ischemia. - low risk  . Diabetic neuropathy   . Chronic back pain   . History of stroke   . Mental disorder     bipolar  . CHF (congestive heart failure)   . Stroke   . Neuromuscular disorder     diabetic neuropathy    Past Surgical History  Procedure Date  . No past surgeries     History reviewed. No pertinent family history.  History  Substance Use Topics  . Smoking status: Current Every Day Smoker -- 1.5 packs/day for 29 years    Types: Cigarettes  . Smokeless tobacco: Never Used  . Alcohol Use: No      Review of Systems  Constitutional: Negative for fever and chills.  Respiratory: Positive for cough and shortness of breath.   Cardiovascular: Positive for orthopnea and leg swelling. Negative for chest pain.  Gastrointestinal: Negative for nausea, vomiting and abdominal pain.  Neurological: Negative for weakness.  All other systems reviewed and are negative.    Allergies  Muscle relief  Home Medications   Current Outpatient Rx  Name Route Sig Dispense Refill  . ALBUTEROL SULFATE HFA 108 (90 BASE) MCG/ACT IN AERS Inhalation Inhale 2 puffs into the lungs every 6 (six) hours as needed. For shortness of breath    . ALBUTEROL SULFATE (2.5 MG/3ML) 0.083% IN NEBU Nebulization Take 2.5 mg by nebulization 3 (three) times daily  as needed. For shortness of breath    . ASPIRIN EC 81 MG PO TBEC Oral Take 81 mg by mouth daily.    Marland Kitchen CLONIDINE HCL 0.2 MG PO TABS Oral Take 0.2 mg by mouth 2 (two) times daily.     . FUROSEMIDE 40 MG PO TABS Oral Take 40 mg by mouth daily.    Marland Kitchen GABAPENTIN 600 MG PO TABS Oral Take 600 mg by mouth 3 (three) times daily.    Marland Kitchen GLIMEPIRIDE 2 MG PO TABS Oral Take 2 mg by mouth daily before breakfast.    . HYDROCODONE-ACETAMINOPHEN 10-325 MG PO TABS Oral Take 1 tablet by mouth every 6 (six) hours as needed. For pain    . LEVOFLOXACIN  500 MG PO TABS Oral Take 1 tablet (500 mg total) by mouth daily. 7 tablet 0  . LISINOPRIL 40 MG PO TABS Oral Take 40 mg by mouth daily.    Marland Kitchen LITHIUM CARBONATE ER 300 MG PO TBCR Oral Take 300 mg by mouth 2 (two) times daily.    Marland Kitchen NITROGLYCERIN 0.4 MG SL SUBL Sublingual Place 0.4 mg under the tongue every 5 (five) minutes as needed. For chest pain    . OMEPRAZOLE 20 MG PO CPDR Oral Take 20 mg by mouth daily.    Marland Kitchen POTASSIUM CHLORIDE ER 10 MEQ PO TBCR Oral Take 10 mEq by mouth daily.    Marland Kitchen PROPRANOLOL HCL ER 60 MG PO CP24 Oral Take 60 mg by mouth daily.    . QUETIAPINE FUMARATE 300 MG PO TABS Oral Take 150-300 mg by mouth at bedtime.    Marland Kitchen SIMVASTATIN 80 MG PO TABS Oral Take 80 mg by mouth at bedtime.    . WARFARIN SODIUM 5 MG PO TABS Oral Take 2.5-5 mg by mouth daily. Take 5mg  on day 1, 2.5mg  on day 2, 5mg  on day 3, and continue accordingly      Triage Vitals: BP 133/92  Pulse 106  Temp 98.2 F (36.8 C) (Oral)  Resp 21  Ht 5\' 8"  (1.727 m)  Wt 258 lb (117.028 kg)  BMI 39.23 kg/m2  SpO2 100%  Physical Exam  Nursing note and vitals reviewed. Constitutional: He is oriented to person, place, and time. He appears well-developed and well-nourished. No distress.  HENT:  Head: Normocephalic and atraumatic.  Eyes: EOM are normal.  Neck: Neck supple. No tracheal deviation present.  Cardiovascular: Regular rhythm and normal heart sounds.   No murmur heard.      Tachycardic   Pulmonary/Chest: Effort normal and breath sounds normal. No respiratory distress. He has no wheezes. He has no rales.  Abdominal: Soft. Bowel sounds are normal. He exhibits no distension. There is no tenderness. There is no rebound and no guarding.  Musculoskeletal: Normal range of motion. He exhibits edema (bilateral pitting edema). He exhibits no tenderness.  Neurological: He is alert and oriented to person, place, and time.  Skin: Skin is warm and dry.  Psychiatric: He has a normal mood and affect. His behavior is normal.      ED Course  Procedures (including critical care time) DIAGNOSTIC STUDIES: Oxygen Saturation is 97% on room air, normal by my interpretation.    COORDINATION OF CARE: At 900 PM Discussed treatment plan with patient which includes IV fluids, CXR, blood work. Patient agrees.   At 1030 PM Rechecked pt who states he is feeling better after changing to 2L 02 nasal cannula and sitting up in bed. Lung fields are clear on bilateral auscultation.  Labs Reviewed  CBC WITH DIFFERENTIAL - Abnormal; Notable for the following:    RDW 16.0 (*)     All other components within normal limits  BASIC METABOLIC PANEL - Abnormal; Notable for the following:    Glucose, Bld 209 (*)     GFR calc non Af Amer 67 (*)     GFR calc Af Amer 78 (*)     All other components within normal limits  BLOOD GAS, ARTERIAL - Abnormal; Notable for the following:    pCO2 arterial 52.0 (*)     pO2, Arterial 312.0 (*)     Bicarbonate 29.9 (*)     Acid-Base Excess 5.0 (*)     All other components within normal limits   Dg Chest Port 1 View  01/03/2012  *RADIOLOGY REPORT*  Clinical Data: Shortness of breath.  COPD.  Hypertension and diabetes.  PORTABLE CHEST - 1 VIEW  Comparison: 01/02/2012  Findings: Cardiomegaly stable.  Both lungs are clear.  No evidence of pleural effusion.  IMPRESSION: Stable cardiomegaly.  No acute findings.   Original Report Authenticated By: Danae Orleans, M.D.    Dg Chest Port 1 View  01/02/2012  *RADIOLOGY REPORT*  Clinical Data: Shortness of breath  PORTABLE CHEST - 1 VIEW  Comparison: 06/25/2011 from [facility]  Findings: Cardiac enlargement with normal pulmonary vascularity. Improving airspace disease in the right lung base.  No new consolidation.  No blunting of costophrenic angles.  No pneumothorax. Mediastinal contours appear intact.  IMPRESSION: Improving right lung base infiltration.  Cardiac enlargement.   Original Report Authenticated By: Marlon Pel, M.D.    Results for  orders placed during the hospital encounter of 01/03/12  CBC WITH DIFFERENTIAL      Component Value Range   WBC 8.2  4.0 - 10.5 K/uL   RBC 4.34  4.22 - 5.81 MIL/uL   Hemoglobin 13.3  13.0 - 17.0 g/dL   HCT 29.5  62.1 - 30.8 %   MCV 93.8  78.0 - 100.0 fL   MCH 30.6  26.0 - 34.0 pg   MCHC 32.7  30.0 - 36.0 g/dL   RDW 65.7 (*) 84.6 - 96.2 %   Platelets 251  150 - 400 K/uL   Neutrophils Relative 67  43 - 77 %   Neutro Abs 5.5  1.7 - 7.7 K/uL   Lymphocytes Relative 24  12 - 46 %   Lymphs Abs 2.0  0.7 - 4.0 K/uL   Monocytes Relative 5  3 - 12 %   Monocytes Absolute 0.4  0.1 - 1.0 K/uL   Eosinophils Relative 3  0 - 5 %   Eosinophils Absolute 0.3  0.0 - 0.7 K/uL   Basophils Relative 1  0 - 1 %   Basophils Absolute 0.1  0.0 - 0.1 K/uL  BASIC METABOLIC PANEL      Component Value Range   Sodium 138  135 - 145 mEq/L   Potassium 4.0  3.5 - 5.1 mEq/L   Chloride 100  96 - 112 mEq/L   CO2 32  19 - 32 mEq/L   Glucose, Bld 209 (*) 70 - 99 mg/dL   BUN 18  6 - 23 mg/dL   Creatinine, Ser 9.52  0.50 - 1.35 mg/dL   Calcium 9.1  8.4 - 84.1 mg/dL   GFR calc non Af Amer 67 (*) >90 mL/min   GFR calc Af Amer 78 (*) >90 mL/min  BLOOD GAS, ARTERIAL  Component Value Range   FIO2 75.00     Delivery systems BILEVEL POSITIVE AIRWAY PRESSURE     Rate 16     Inspiratory PAP 16     Expiratory PAP 8     pH, Arterial 7.378  7.350 - 7.450   pCO2 arterial 52.0 (*) 35.0 - 45.0 mmHg   pO2, Arterial 312.0 (*) 80.0 - 100.0 mmHg   Bicarbonate 29.9 (*) 20.0 - 24.0 mEq/L   TCO2 26.9  0 - 100 mmol/L   Acid-Base Excess 5.0 (*) 0.0 - 2.0 mmol/L   O2 Saturation 99.7     Patient temperature 37.0     Collection site LEFT RADIAL     Drawn by [id number]     Sample type ARTERIAL     Allens test (pass/fail) PASS  PASS    Date: 01/03/2012  Rate: 93  Rhythm: atrial fibrillation  QRS Axis: normal  Intervals: normal  ST/T Wave abnormalities: nonspecific T wave changes  Conduction Disutrbances:right bundle branch  block  Narrative Interpretation:   Old EKG Reviewed: unchanged From 01/02/12 Incomplete RBBB   1. COPD with acute exacerbation       MDM   A patient well known to our hospitalist. Patient was just discharged this morning progressively increased shortness of breath throughout the afternoon tried his CPAP at home without much relief eventually his roommate called EMS tonight to bring him in he was supposedly in some respiratory distress did have BiPAP started in route we continued to hear patient improved quickly on the BiPAP year feels much better on 2 L of oxygen his oxygen sats on 2 L of around 97. Little better retention of his PCO2 on the blood gas chest x-ray is negative. Dr. Orvan Falconer from the hospitalist service will come down and assess him disposition based on their judgment. Patient does feel better with 2 L of oxygen on even though there may not be an oxygen requirement. Patient was just admitted yesterday for the shortness of breath he felt he was a little bit of fluid overload they diuresed him and discharged him this morning.     I personally performed the services described in this documentation, which was scribed in my presence. The recorded information has been reviewed and considered.          Shelda Jakes, MD 01/03/12 2316   Addendum: Patient evaluated by hospitalist they will admit to [facility] orders completed. Room air arterial blood gas has been ordered.  Shelda Jakes, MD 01/03/12 [phone]

## 2012-01-03 NOTE — Progress Notes (Signed)
ANTICOAGULATION CONSULT NOTE - Initial Consult  Pharmacy Consult for Coumadin Indication: atrial fibrillation  Allergies  Allergen Reactions  . Muscle Relief (Capsaicin)     Hysteria     Patient Measurements: Height: 5\' 8"  (172.7 cm) Weight: 258 lb 8 oz (117.255 kg) IBW/kg (Calculated) : 68.4   Vital Signs: Temp: 97.1 F (36.2 C) (10/17 0534) Temp src: Oral (10/17 0251) BP: 161/92 mmHg (10/17 0534) Pulse Rate: 106  (10/17 0534)  Labs:  Basename 01/03/12 0444 01/02/12 2238 01/02/12 2219  HGB 13.1 -- 14.7  HCT 41.4 -- 45.3  PLT 242 -- 248  APTT -- -- --  LABPROT 26.3* -- --  INR 2.56* -- --  HEPARINUNFRC -- -- --  CREATININE 1.19 -- 1.24  CKTOTAL -- -- --  CKMB -- -- --  TROPONINI -- <0.30 --    Estimated Creatinine Clearance: 84.2 ml/min (by C-G formula based on Cr of 1.19).   Medical History: Past Medical History  Diagnosis Date  . Hypertension   . Diabetes mellitus   . Bipolar 1 disorder   . Hyperlipidemia   . Atrial fibrillation   . COPD (chronic obstructive pulmonary disease)   . Obstructive sleep apnea   . Chronic diastolic heart failure     Echocardiogram 05/14/11: Mild LVH, EF 50-55%, no wall motion abnormalities, grade 2 diastolic dysfunction.  . Chest pain     Myoview 05/17/11 demonstrated an EF of 61%, small fixed inferoseptal defect consistent with thinning vs prior infarct; no ischemia. - low risk  . Diabetic neuropathy   . Chronic back pain   . History of stroke   . Mental disorder     bipolar  . CHF (congestive heart failure)   . Stroke   . Neuromuscular disorder     diabetic neuropathy    Medications:  Scheduled:    . aspirin EC  81 mg Oral Daily  . atorvastatin  40 mg Oral q1800  . cloNIDine  0.2 mg Oral BID  . furosemide  40 mg Intravenous Q12H  . gabapentin  600 mg Oral TID  . glimepiride  2 mg Oral QAC breakfast  . guaiFENesin  1,200 mg Oral BID  . HYDROcodone-acetaminophen      . insulin aspart  0-5 Units Subcutaneous QHS    . insulin aspart  0-9 Units Subcutaneous TID WC  . ipratropium  0.5 mg Nebulization Once  . levalbuterol  1.25 mg Nebulization Once  . levalbuterol  1.25 mg Nebulization Q6H  . lisinopril  40 mg Oral Daily  . lithium carbonate  300 mg Oral BID  . moxifloxacin  400 mg Intravenous Once  . moxifloxacin  400 mg Oral Q2000  . pantoprazole  40 mg Oral Daily  . potassium chloride  40 mEq Oral BID  . propranolol ER  60 mg Oral Daily  . QUEtiapine  150-300 mg Oral QHS  . sodium chloride  3 mL Intravenous Q12H  . warfarin  5 mg Oral Q48H   And  . warfarin  2.5 mg Oral Q48H  . Warfarin - Pharmacist Dosing Inpatient   Does not apply q1800  . DISCONTD: heparin  5,000 Units Subcutaneous Q8H    Assessment: 56 yo M admitted with COPD vs HF exacerbation.  He is on chronic warfarin 2.5mg  alternating with 5mg  daily for Afib.  INR is therapeutic on admission. No bleeding noted.   Goal of Therapy:  INR 2-3 Monitor platelets by anticoagulation protocol: Yes   Plan:  1) Resume Coumadin home  dose 2) Daily INR for now 3) D/C sq heparin for DVT px since INR therapeutic.   Elson Clan 01/03/2012,8:21 AM

## 2012-01-03 NOTE — Discharge Summary (Signed)
Physician Discharge Summary  Sean Warren ZOX:[id number] DOB: September 17, 1955 DOA: 01/02/2012  PCP: [name], MD  Admit date: 01/02/2012 Discharge date: 01/03/2012  Recommendations for Outpatient Follow-up:  1. Follow with primary care physician in a week or so.   Discharge Diagnoses:  1. Acute congestive heart failure, diastolic, improved with diuresis. 2. Element of COPD exacerbation. 3. Chronic atrial fibrillation on anticoagulation. 4. Type 2 diabetes mellitus. 5 GERD obesity. 6. Bipolar disorder.   Discharge Condition: Stable and improved.  Diet recommendation: Carbohydrate modified diet.  Filed Weights   01/02/12 2214 01/03/12 0251  Weight: 114.76 kg (253 lb) 117.255 kg (258 lb 8 oz)    History of present illness:  This 56 year old man presents to the hospital with symptoms of dyspnea for approximately one week. Please see initial history as outlined below: Sean Warren is an 56 y.o. male. History of bipolar disorder, diastolic heart failure, tobacco abuse and COPD, who has a history of repeatedly leaving medical care AGAINST MEDICAL ADVICE, against again tonight with the above symptoms. He complains also of fever, chills and cough productive of yellow sputum. On evaluation in the emergency room patient was maintained his oxygenation while ambulating at the end of his walk his oxygen saturation dropped to 86% smokeless service was called to assist.  He also complains of palpitations but does have a history of paroxysmal A. Fib.  Patient denies any other new symptoms; he does have chronic lower extremity edema.  Hospital Course:  The patient was admitted and started on intravenous furosemide. He made a significant improvement overnight with this and he feels back to his normal self. He is keen to go home. He has been coughing up purulent sputum also. His oxygen saturation on 2 L of oxygen his 100%. He does not require home oxygen normally. Chest x-ray showed a possible  right lung base infiltrate but I think with my eye this is very minimal. He stable for discharge.  Procedures: None.  Consultations:  None.  Discharge Exam: Filed Vitals:   01/03/12 0130 01/03/12 0251 01/03/12 0534 01/03/12 0712  BP:  135/109 161/92   Pulse: 87  106   Temp:  97.6 F (36.4 C) 97.1 F (36.2 C)   TempSrc:  Oral    Resp: 19 20 20    Height:  5\' 8"  (1.727 m)    Weight:  117.255 kg (258 lb 8 oz)    SpO2: 97% 98% 96% 100%    General: He looks systemically well. He does not have increased work of breathing at rest. There is no peripheral or central cyanosis. He is obese. He is not clinically septic or toxic. Cardiovascular: Heart sounds are present and in atrial fibrillation. There is no evidence of heart failure clinically. Respiratory: Lung fields are entirely clear this morning. There are no wheezes, crackles or bronchial breathing.  Discharge Instructions  Discharge Orders    Future Orders Please Complete By Expires   Diet - low sodium heart healthy      Increase activity slowly          Medication List     As of 01/03/2012  8:43 AM    TAKE these medications         albuterol 108 (90 BASE) MCG/ACT inhaler   Commonly known as: PROVENTIL HFA;VENTOLIN HFA   Inhale 2 puffs into the lungs every 6 (six) hours as needed. For shortness of breath      albuterol (2.5 MG/3ML) 0.083% nebulizer solution   Commonly known as:  PROVENTIL   Take 2.5 mg by nebulization 3 (three) times daily as needed. For shortness of breath      aspirin EC 81 MG tablet   Take 81 mg by mouth daily.      cloNIDine 0.2 MG tablet   Commonly known as: CATAPRES   Take 0.2 mg by mouth 2 (two) times daily.      furosemide 40 MG tablet   Commonly known as: LASIX   Take 40 mg by mouth daily.      gabapentin 600 MG tablet   Commonly known as: NEURONTIN   Take 600 mg by mouth 3 (three) times daily.      glimepiride 2 MG tablet   Commonly known as: AMARYL   Take 2 mg by mouth daily  before breakfast.      HYDROcodone-acetaminophen 10-325 MG per tablet   Commonly known as: NORCO   Take 1 tablet by mouth every 6 (six) hours as needed. For pain      levofloxacin 500 MG tablet   Commonly known as: LEVAQUIN   Take 1 tablet (500 mg total) by mouth daily.      lisinopril 40 MG tablet   Commonly known as: PRINIVIL,ZESTRIL   Take 40 mg by mouth daily.      lithium carbonate 300 MG CR tablet   Commonly known as: LITHOBID   Take 300 mg by mouth 2 (two) times daily.      nitroGLYCERIN 0.4 MG SL tablet   Commonly known as: NITROSTAT   Place 0.4 mg under the tongue every 5 (five) minutes as needed. For chest pain      omeprazole 20 MG capsule   Commonly known as: PRILOSEC   Take 20 mg by mouth daily.      potassium chloride 10 MEQ tablet   Commonly known as: K-DUR   Take 10 mEq by mouth daily.      propranolol ER 60 MG 24 hr capsule   Commonly known as: INDERAL LA   Take 60 mg by mouth daily.      QUEtiapine 300 MG tablet   Commonly known as: SEROQUEL   Take 150-300 mg by mouth at bedtime.      simvastatin 80 MG tablet   Commonly known as: ZOCOR   Take 80 mg by mouth at bedtime.      warfarin 5 MG tablet   Commonly known as: COUMADIN   Take 2.5-5 mg by mouth daily. Take 5mg  on day 1, 2.5mg  on day 2, 5mg  on day 3, and continue accordingly          The results of significant diagnostics from this hospitalization (including imaging, microbiology, ancillary and laboratory) are listed below for reference.    Significant Diagnostic Studies: Dg Chest Port 1 View  01/02/2012  *RADIOLOGY REPORT*  Clinical Data: Shortness of breath  PORTABLE CHEST - 1 VIEW  Comparison: [facility]/25/2011 [facility]  Findings: Cardiac enlargement with normal pulmonary vascularity. Improving airspace disease in the right lung base.  No new consolidation.  No blunting of costophrenic angles.  No pneumothorax. Mediastinal contours appear intact.  IMPRESSION: Improving right  lung base infiltration.  Cardiac enlargement.   Original Report Authenticated By: Marlon Pel, M.D.         Labs: Basic Metabolic Panel:  Lab 01/03/12 1610 01/02/12 2219  NA -- 141  K -- 4.4  CL -- 103  CO2 -- 32  GLUCOSE -- 171*  BUN -- 14  CREATININE  1.19 1.24  CALCIUM -- 9.6  MG 2.2 --  PHOS -- --       CBC:  Lab 01/03/12 0444 01/02/12 2219  WBC 8.6 8.4  NEUTROABS -- --  HGB 13.1 14.7  HCT 41.4 45.3  MCV 95.4 95.0  PLT 242 248   Cardiac Enzymes:  Lab 01/02/12 2238  CKTOTAL --  CKMB --  CKMBINDEX --  TROPONINI <0.30   BNP: BNP (last 3 results)  Basename 01/02/12 2219 06/25/11 0840 06/24/11 2247  PROBNP 1881.0* 4788.0* 4434.0*   CBG:  Lab 01/03/12 0834  GLUCAP 130*    Time coordinating discharge: *Less than 30 minutes  Signed:  Wilson Singer  [facility] 01/03/2012, 8:43 AM

## 2012-01-04 ENCOUNTER — Encounter (HOSPITAL_COMMUNITY): Payer: Self-pay

## 2012-01-04 DIAGNOSIS — J441 Chronic obstructive pulmonary disease with (acute) exacerbation: Secondary | ICD-10-CM

## 2012-01-04 DIAGNOSIS — I4891 Unspecified atrial fibrillation: Secondary | ICD-10-CM

## 2012-01-04 DIAGNOSIS — F319 Bipolar disorder, unspecified: Secondary | ICD-10-CM

## 2012-01-04 LAB — CBC
Hemoglobin: 13.3 g/dL (ref 13.0–17.0)
MCH: 30.7 pg (ref 26.0–34.0)
RBC: 4.33 MIL/uL (ref 4.22–5.81)

## 2012-01-04 LAB — BASIC METABOLIC PANEL
CO2: 30 mEq/L (ref 19–32)
GFR calc non Af Amer: 73 mL/min — ABNORMAL LOW (ref >90)
Glucose, Bld: 167 mg/dL — ABNORMAL HIGH (ref 70–99)
Potassium: 4.1 mEq/L (ref 3.5–5.1)
Sodium: 141 mEq/L (ref 135–145)

## 2012-01-04 LAB — BLOOD GAS, ARTERIAL
Acid-Base Excess: 4.1 mmol/L — ABNORMAL HIGH (ref 0.0–2.0)
Bicarbonate: 28.6 mEq/L — ABNORMAL HIGH (ref 20.0–24.0)
O2 Saturation: 92.3 %
TCO2: 25.6 mmol/L (ref 0–100)
pO2, Arterial: 59.1 mmHg — ABNORMAL LOW (ref 80.0–100.0)

## 2012-01-04 LAB — MAGNESIUM: Magnesium: 2.4 mg/dL (ref 1.5–2.5)

## 2012-01-04 MED ORDER — CLONIDINE HCL 0.2 MG PO TABS: 0.2000 mg | ORAL_TABLET | Freq: Two times a day (BID) | ORAL | Status: DC

## 2012-01-04 MED ORDER — ONDANSETRON HCL 4 MG PO TABS: 4.0000 mg | ORAL_TABLET | Freq: Four times a day (QID) | ORAL | Status: DC | PRN

## 2012-01-04 MED ORDER — FUROSEMIDE 40 MG PO TABS: 40.0000 mg | ORAL_TABLET | Freq: Every day | ORAL | Status: DC

## 2012-01-04 MED ORDER — SODIUM CHLORIDE 0.9 % IJ SOLN: 3.0000 mL | INTRAMUSCULAR | Status: DC | PRN

## 2012-01-04 MED ORDER — WARFARIN SODIUM 5 MG PO TABS: 5.0000 mg | ORAL_TABLET | ORAL | Status: DC

## 2012-01-04 MED ORDER — SODIUM CHLORIDE 0.9 % IJ SOLN: 3.0000 mL | Freq: Two times a day (BID) | INTRAMUSCULAR | Status: DC

## 2012-01-04 MED ORDER — ACETAMINOPHEN 650 MG RE SUPP: 650.0000 mg | Freq: Four times a day (QID) | RECTAL | Status: DC | PRN

## 2012-01-04 MED ORDER — NITROGLYCERIN 0.4 MG SL SUBL: 0.4000 mg | SUBLINGUAL_TABLET | SUBLINGUAL | Status: DC | PRN

## 2012-01-04 MED ORDER — GABAPENTIN 300 MG PO CAPS: 600.0000 mg | ORAL_CAPSULE | Freq: Three times a day (TID) | ORAL | Status: DC

## 2012-01-04 MED ORDER — WARFARIN - PHYSICIAN DOSING INPATIENT: Freq: Every day | Status: DC

## 2012-01-04 MED ORDER — ALBUTEROL SULFATE (5 MG/ML) 0.5% IN NEBU
2.5000 mg | INHALATION_SOLUTION | Freq: Four times a day (QID) | RESPIRATORY_TRACT | Status: DC | PRN
  Administered 2012-01-04: 2.5 mg via RESPIRATORY_TRACT
  Filled 2012-01-04: qty 0.5

## 2012-01-04 MED ORDER — INSULIN ASPART 100 UNIT/ML ~~LOC~~ SOLN
0.0000 [IU] | Freq: Every day | SUBCUTANEOUS | Status: DC
Start: 2012-01-04 — End: 2012-01-04

## 2012-01-04 MED ORDER — HYDROCODONE-ACETAMINOPHEN 10-325 MG PO TABS: 1.0000 | ORAL_TABLET | Freq: Four times a day (QID) | ORAL | Status: DC | PRN

## 2012-01-04 MED ORDER — WARFARIN SODIUM 2.5 MG PO TABS: 2.5000 mg | ORAL_TABLET | ORAL | Status: DC

## 2012-01-04 MED ORDER — LITHIUM CARBONATE ER 300 MG PO TBCR
300.0000 mg | EXTENDED_RELEASE_TABLET | Freq: Two times a day (BID) | ORAL | Status: DC
  Filled 2012-01-04: qty 1

## 2012-01-04 MED ORDER — INSULIN ASPART 100 UNIT/ML ~~LOC~~ SOLN
0.0000 [IU] | Freq: Three times a day (TID) | SUBCUTANEOUS | Status: DC
  Administered 2012-01-04: 3 [IU] via SUBCUTANEOUS

## 2012-01-04 MED ORDER — PANTOPRAZOLE SODIUM 40 MG PO TBEC: 40.0000 mg | DELAYED_RELEASE_TABLET | Freq: Every day | ORAL | Status: DC

## 2012-01-04 MED ORDER — ONDANSETRON HCL 4 MG/2ML IJ SOLN: 4.0000 mg | Freq: Four times a day (QID) | INTRAMUSCULAR | Status: DC | PRN

## 2012-01-04 MED ORDER — POTASSIUM CHLORIDE CRYS ER 10 MEQ PO TBCR
10.0000 meq | EXTENDED_RELEASE_TABLET | Freq: Every day | ORAL | Status: DC
  Filled 2012-01-04: qty 1

## 2012-01-04 MED ORDER — SODIUM CHLORIDE 0.9 % IV SOLN: 250.0000 mL | INTRAVENOUS | Status: DC | PRN

## 2012-01-04 MED ORDER — ASPIRIN EC 81 MG PO TBEC: 81.0000 mg | DELAYED_RELEASE_TABLET | Freq: Every day | ORAL | Status: DC

## 2012-01-04 MED ORDER — LEVOFLOXACIN 500 MG PO TABS: 500.0000 mg | ORAL_TABLET | Freq: Every day | ORAL | Status: DC

## 2012-01-04 MED ORDER — OXYCODONE HCL 5 MG PO TABS: 5.0000 mg | ORAL_TABLET | ORAL | Status: DC | PRN

## 2012-01-04 MED ORDER — QUETIAPINE FUMARATE 300 MG PO TABS: 150.0000 mg | ORAL_TABLET | Freq: Every day | ORAL | Status: DC

## 2012-01-04 MED ORDER — ATORVASTATIN CALCIUM 40 MG PO TABS: 40.0000 mg | ORAL_TABLET | Freq: Every day | ORAL | Status: DC

## 2012-01-04 MED ORDER — LISINOPRIL 10 MG PO TABS: 40.0000 mg | ORAL_TABLET | Freq: Every day | ORAL | Status: DC

## 2012-01-04 MED ORDER — ACETAMINOPHEN 325 MG PO TABS: 650.0000 mg | ORAL_TABLET | Freq: Four times a day (QID) | ORAL | Status: DC | PRN

## 2012-01-04 MED ORDER — ALBUTEROL SULFATE HFA 108 (90 BASE) MCG/ACT IN AERS: 2.0000 | INHALATION_SPRAY | Freq: Four times a day (QID) | RESPIRATORY_TRACT | Status: DC | PRN

## 2012-01-04 MED ORDER — TRAZODONE HCL 50 MG PO TABS: 25.0000 mg | ORAL_TABLET | Freq: Every evening | ORAL | Status: DC | PRN

## 2012-01-04 MED ORDER — GABAPENTIN 600 MG PO TABS
600.0000 mg | ORAL_TABLET | Freq: Three times a day (TID) | ORAL | Status: DC
  Filled 2012-01-04 (×2): qty 1

## 2012-01-04 MED ORDER — GLIMEPIRIDE 2 MG PO TABS
2.0000 mg | ORAL_TABLET | Freq: Every day | ORAL | Status: DC
  Administered 2012-01-04: 2 mg via ORAL
  Filled 2012-01-04: qty 1

## 2012-01-04 MED ORDER — PROPRANOLOL HCL ER 60 MG PO CP24
60.0000 mg | ORAL_CAPSULE | Freq: Every day | ORAL | Status: DC
  Filled 2012-01-04: qty 1

## 2012-01-04 MED ORDER — WARFARIN SODIUM 2.5 MG PO TABS: 2.5000 mg | ORAL_TABLET | Freq: Every day | ORAL | Status: DC

## 2012-01-04 MED ORDER — QUETIAPINE FUMARATE 25 MG PO TABS: 150.0000 mg | ORAL_TABLET | Freq: Every day | ORAL | Status: DC

## 2012-01-04 NOTE — H&P (Signed)
[facility] History and Physical  ODUS CLASBY  WUJ:[id number]  DOB: [birth year]   DOA: 01/03/2012   PCP:   [name], MD   Chief Complaint:  Difficulty breathing  HPI: Sean Warren is an 56 y.o. male.   Obese Caucasian gentleman who was discharged from the hospitalist service this morning after an overnight stay for respiratory distress of multifactorial etiology. Please refer to yesterday's admission history and physica, and this mornings discharge summary.  Patient reports that he felt fine after returning home later in the day he began to become very short of breath whenever he walked, and also with lying down. He tried using his home CPAP which did not help, it got so bad that he called EMS and he had to be placed on BiPAP during transport to the emergency room.  Other than BiPAP and oxygen he required no other specific treatment in the emergency room, and eventually his breathing settled down but he was able to be weaned off oxygen.Marland Kitchen arrhythmias noted the toe although his oxygenation was stable at around 94 on room air, and if he moves it dropped down into 89 and below.  She says she used to be on home oxygen, as [place]. He would like to be restarted on home oxygen.  Rewiew of Systems:   Other than noted above there are no new additions to review of systems   Past Medical History  Diagnosis Date  . Hypertension   . Diabetes mellitus   . Bipolar 1 disorder   . Hyperlipidemia   . Atrial fibrillation   . COPD (chronic obstructive pulmonary disease)   . Obstructive sleep apnea   . Chronic diastolic heart failure     Echocardiogram 05/14/11: Mild LVH, EF 50-55%, no wall motion abnormalities, grade 2 diastolic dysfunction.  . Chest pain     Myoview 05/17/11 demonstrated an EF of 61%, small fixed inferoseptal defect consistent with thinning vs prior infarct; no ischemia. - low risk  . Diabetic neuropathy   . Chronic back pain     . History of stroke   . Mental disorder     bipolar  . CHF (congestive heart failure)   . Stroke   . Neuromuscular disorder     diabetic neuropathy    Past Surgical History  Procedure Date  . No past surgeries     Medications:  HOME MEDS: Prior to Admission medications   Medication Sig Start Date End Date Taking? Authorizing Provider  albuterol (PROVENTIL HFA;VENTOLIN HFA) 108 (90 BASE) MCG/ACT inhaler Inhale 2 puffs into the lungs every 6 (six) hours as needed. For shortness of breath    Historical Provider, MD  albuterol (PROVENTIL) (2.5 MG/3ML) 0.083% nebulizer solution Take 2.5 mg by nebulization 3 (three) times daily as needed. For shortness of breath    Historical Provider, MD  aspirin EC 81 MG tablet Take 81 mg by mouth daily.    Historical Provider, MD  cloNIDine (CATAPRES) 0.2 MG tablet Take 0.2 mg by mouth 2 (two) times daily.  05/16/11   Historical Provider, MD  furosemide (LASIX) 40 MG tablet Take 40 mg by mouth daily.    Historical Provider, MD  gabapentin (NEURONTIN) 600 MG tablet Take 600 mg by mouth 3 (three) times daily.    Historical Provider, MD  glimepiride (AMARYL) 2 MG tablet Take 2 mg by mouth daily before breakfast.    Historical Provider, MD  HYDROcodone-acetaminophen (NORCO) 10-325 MG per tablet Take 1  tablet by mouth every 6 (six) hours as needed. For pain    Historical Provider, MD  levofloxacin (LEVAQUIN) 500 MG tablet Take 1 tablet (500 mg total) by mouth daily. 01/03/12 01/13/12  Nimish Normajean Glasgow, MD  lisinopril (PRINIVIL,ZESTRIL) 40 MG tablet Take 40 mg by mouth daily.    Historical Provider, MD  lithium carbonate (LITHOBID) 300 MG CR tablet Take 300 mg by mouth 2 (two) times daily.    Historical Provider, MD  nitroGLYCERIN (NITROSTAT) 0.4 MG SL tablet Place 0.4 mg under the tongue every 5 (five) minutes as needed. For chest pain    Historical Provider, MD  omeprazole (PRILOSEC) 20 MG capsule Take 20 mg by mouth daily.    Historical Provider, MD   potassium chloride (K-DUR) 10 MEQ tablet Take 10 mEq by mouth daily.    Historical Provider, MD  propranolol ER (INDERAL LA) 60 MG 24 hr capsule Take 60 mg by mouth daily.    Historical Provider, MD  QUEtiapine (SEROQUEL) 300 MG tablet Take 150-300 mg by mouth at bedtime.    Historical Provider, MD  simvastatin (ZOCOR) 80 MG tablet Take 80 mg by mouth at bedtime.    Historical Provider, MD  warfarin (COUMADIN) 5 MG tablet Take 2.5-5 mg by mouth daily. Take 5mg  on day 1, 2.5mg  on day 2, 5mg  on day 3, and continue accordingly    Historical Provider, MD     Allergies:  Allergies  Allergen Reactions  . Muscle Relief (Capsaicin)     Hysteria     Social History:   reports that he has been smoking Cigarettes.  He has a 43.5 pack-year smoking history. He has never used smokeless tobacco. He reports that he does not drink alcohol or use illicit drugs.  Family History: History reviewed. No pertinent family history.unknown   Physical Exam: Filed Vitals:   01/03/12 2200 01/03/12 2300 01/04/12 0000 01/04/12 0040  BP: 123/75 125/66  144/94  Pulse: 97  97 105  Temp:    97.8 F (36.6 C)  TempSrc:    Oral  Resp: 21 15 17 18   Height:    5\' 8"  (1.727 m)  Weight:    117.3 kg (258 lb 9.6 oz)  SpO2: 95%  93% 99%   Blood pressure 144/94, pulse 105, temperature 97.8 F (36.6 C), temperature source Oral, resp. rate 18, height 5\' 8"  (1.727 m), weight 117.3 kg (258 lb 9.6 oz), SpO2 99.00%.  GEN: Pleasant middle-aged obese Caucasian gentleman sitting up in the stretcher in no acute distress; cooperative with exam  PSYCH: alert and oriented x4; does not appear anxious or depressed; affect is flat  HEENT: Mucous membranes pink and anicteric; PERRLA; EOM intact; no cervical lymphadenopathy nor thyromegaly or carotid bruit; thick neck  Breasts:: Not examined  CHEST WALL: No tenderness  CHEST: Prolonged expiration; occasional rhonchi  HEART: Irregularly irregular rhythm; no murmurs rubs or gallops   BACK: No kyphosis or scoliosis; no CVA tenderness  ABDOMEN: Obese, distended by fat, non-tender; normal abdominal bowel sounds; large pannus; no intertriginous candida.  Rectal Exam: Not done  EXTREMITIES: ; age-appropriate arthropathy of the hands and knees; 2+edema; no ulcerations. Warm dusky extremities due to venous congestion  Genitalia: not examined  PULSES: 2+ and symmetric  SKIN: Normal hydration no rash or ulceration  CNS: Cranial nerves 2-12 grossly intact no focal lateralizing neurologic deficit    Labs on Admission:  Basic Metabolic Panel:  Lab 01/03/12 1610 01/03/12 0444 01/02/12 2219  NA 138 -- 141  K 4.0 -- 4.4  CL 100 -- 103  CO2 32 -- 32  GLUCOSE 209* -- 171*  BUN 18 -- 14  CREATININE 1.17 1.19 1.24  CALCIUM 9.1 -- 9.6  MG -- 2.2 --  PHOS -- -- --   Liver Function Tests: No results found for this basename: AST:5,ALT:5,ALKPHOS:5,BILITOT:5,PROT:5,ALBUMIN:5 in the last 168 hours No results found for this basename: LIPASE:5,AMYLASE:5 in the last 168 hours No results found for this basename: AMMONIA:5 in the last 168 hours CBC:  Lab 01/03/12 2135 01/03/12 0444 01/02/12 2219  WBC 8.2 8.6 8.4  NEUTROABS 5.5 -- --  HGB 13.3 13.1 14.7  HCT 40.7 41.4 45.3  MCV 93.8 95.4 95.0  PLT 251 242 248   Cardiac Enzymes:  Lab 01/02/12 2238  CKTOTAL --  CKMB --  CKMBINDEX --  TROPONINI <0.30   BNP: No components found with this basename: POCBNP:5 D-dimer: No components found with this basename: D-DIMER:5 CBG:  Lab 01/03/12 0834  GLUCAP 130*    Radiological Exams on Admission: Dg Chest Port 1 View  01/03/2012  *RADIOLOGY REPORT*  Clinical Data: Shortness of breath.  COPD.  Hypertension and diabetes.  PORTABLE CHEST - 1 VIEW  Comparison: 01/02/2012  Findings: Cardiomegaly stable.  Both lungs are clear.  No evidence of pleural effusion.  IMPRESSION: Stable cardiomegaly.  No acute findings.   Original Report Authenticated By: Danae Orleans, M.D.    Dg Chest Port  1 View  01/02/2012  *RADIOLOGY REPORT*  Clinical Data: Shortness of breath  PORTABLE CHEST - 1 VIEW  Comparison: 06/25/2011 from [facility]  Findings: Cardiac enlargement with normal pulmonary vascularity. Improving airspace disease in the right lung base.  No new consolidation.  No blunting of costophrenic angles.  No pneumothorax. Mediastinal contours appear intact.  IMPRESSION: Improving right lung base infiltration.  Cardiac enlargement.   Original Report Authenticated By: Marlon Pel, M.D.       Assessment/Plan Present on Admission:  .COPD (chronic obstructive pulmonary disease) .Hypoxia .DM (diabetes mellitus), under control  .Bipolar disorder .Obstructive sleep apnea .Tobacco abuse .Obesity   PLAN: Sliding scale insulin; Continue home meds; We'll bring him in on observation so that home oxygen can be arranged  Other plans as per orders.     Adilynne Fitzwater Nocturnist [facility] Pager [phone]  01/04/2012, 2:00 AM

## 2012-01-04 NOTE — Progress Notes (Signed)
Patient received discharge instructions along with O2 for home use. Patient verbalized understanding of all instructions. Patient was escorted by staff via wheelchair to vehicle. Patient discharged to home in stable condition.

## 2012-01-04 NOTE — Progress Notes (Signed)
Pt states that takes Seroquel 300mg  1/2 tablet as needed. He takes Coumadin 2.5mg  one day and 5mg  the next. He had 5mg  on Thursday 10/17 and will need 2.5mg  on  10/18.

## 2012-01-04 NOTE — Discharge Summary (Signed)
Physician Discharge Summary  Sean Warren OZH:[id number] DOB: 10-13-1955 DOA: 01/03/2012  PCP: [name], MD  Admit date: 01/03/2012 Discharge date: 01/04/2012  Recommendations for Outpatient Follow-up:  1. Follow with outpatient primary care physician. 2. Requires home oxygen 2 L per minute.  Discharge Diagnoses:  1. Acute congestive heart failure, diastolic, improved with diuresis.  2. Element of COPD exacerbation. Oxygen requirement of 2 L per minute. 3. Chronic atrial fibrillation on anticoagulation.  4. Type 2 diabetes mellitus.  5 GERD obesity.  6. Bipolar disorder.    Discharge Condition: Stable.  Diet recommendation: Diabetic diet.  Filed Weights   01/03/12 2030 01/04/12 0040 01/04/12 0446  Weight: 117.028 kg (258 lb) 117.3 kg (258 lb 9.6 oz) 114.2 kg (251 lb 12.3 oz)    History of present illness:  This 56 year old man, who was discharged only yesterday, was readmitted because of respiratory distress.  Hospital Course:  In fact when he was admitted he was apparently on BiPAP but I think that he was not into respiratory distress that he required BiPAP. He does require home oxygen and I think this is the reason for his admission. He is perfectly stable now and feels back to his usual self. His saturation of oxygen on room air was only 88% and therefore he would qualify for home oxygen. We will make arrangements for this and he can be discharged home today.  Procedures:  None.   Consultations:  None.  Discharge Exam: Filed Vitals:   01/04/12 0243 01/04/12 0244 01/04/12 0446 01/04/12 0731  BP:   175/94   Pulse:   93 93  Temp:   97.4 F (36.3 C)   TempSrc:      Resp:   16   Height:      Weight:   114.2 kg (251 lb 12.3 oz)   SpO2: 99% 99% 98% 99%    General: He looks systemically well. Cardiovascular: There is normal heart sounds. No murmurs Respiratory: There is no respiratory distress. There is no increased work of breathing. Lung fields are  entirely clear today.  Discharge Instructions  Discharge Orders    Future Orders Please Complete By Expires   Diet - low sodium heart healthy      Increase activity slowly          Medication List     As of 01/04/2012  9:50 AM    TAKE these medications         albuterol 108 (90 BASE) MCG/ACT inhaler   Commonly known as: PROVENTIL HFA;VENTOLIN HFA   Inhale 2 puffs into the lungs every 6 (six) hours as needed. For shortness of breath      albuterol (2.5 MG/3ML) 0.083% nebulizer solution   Commonly known as: PROVENTIL   Take 2.5 mg by nebulization 3 (three) times daily as needed. For shortness of breath      aspirin EC 81 MG tablet   Take 81 mg by mouth daily.      cloNIDine 0.2 MG tablet   Commonly known as: CATAPRES   Take 0.2 mg by mouth 2 (two) times daily.      furosemide 40 MG tablet   Commonly known as: LASIX   Take 40 mg by mouth daily.      gabapentin 600 MG tablet   Commonly known as: NEURONTIN   Take 600 mg by mouth 3 (three) times daily.      glimepiride 2 MG tablet   Commonly known as: AMARYL   Take 2 mg  by mouth daily before breakfast.      HYDROcodone-acetaminophen 10-325 MG per tablet   Commonly known as: NORCO   Take 1 tablet by mouth every 6 (six) hours as needed. For pain      levofloxacin 500 MG tablet   Commonly known as: LEVAQUIN   Take 1 tablet (500 mg total) by mouth daily.      lisinopril 40 MG tablet   Commonly known as: PRINIVIL,ZESTRIL   Take 40 mg by mouth daily.      lithium carbonate 300 MG CR tablet   Commonly known as: LITHOBID   Take 300 mg by mouth 2 (two) times daily.      nitroGLYCERIN 0.4 MG SL tablet   Commonly known as: NITROSTAT   Place 0.4 mg under the tongue every 5 (five) minutes as needed. For chest pain      omeprazole 20 MG capsule   Commonly known as: PRILOSEC   Take 20 mg by mouth daily.      potassium chloride 10 MEQ tablet   Commonly known as: K-DUR   Take 10 mEq by mouth daily.      propranolol ER  60 MG 24 hr capsule   Commonly known as: INDERAL LA   Take 60 mg by mouth daily.      QUEtiapine 300 MG tablet   Commonly known as: SEROQUEL   Take 150 mg by mouth at bedtime.      simvastatin 80 MG tablet   Commonly known as: ZOCOR   Take 80 mg by mouth at bedtime.      warfarin 5 MG tablet   Commonly known as: COUMADIN   Take 2.5-5 mg by mouth daily. Take 5mg  on day 1, 2.5mg  on day 2, 5mg  on day 3, and continue accordingly          The results of significant diagnostics from this hospitalization (including imaging, microbiology, ancillary and laboratory) are listed below for reference.    Significant Diagnostic Studies: Dg Chest Port 1 View  01/03/2012  *RADIOLOGY REPORT*  Clinical Data: Shortness of breath.  COPD.  Hypertension and diabetes.  PORTABLE CHEST - 1 VIEW  Comparison: 01/02/2012  Findings: Cardiomegaly stable.  Both lungs are clear.  No evidence of pleural effusion.  IMPRESSION: Stable cardiomegaly.  No acute findings.   Original Report Authenticated By: Danae Orleans, M.D.    Dg Chest Port 1 View  01/02/2012  *RADIOLOGY REPORT*  Clinical Data: Shortness of breath  PORTABLE CHEST - 1 VIEW  Comparison: 06/25/2011 from [facility]  Findings: Cardiac enlargement with normal pulmonary vascularity. Improving airspace disease in the right lung base.  No new consolidation.  No blunting of costophrenic angles.  No pneumothorax. Mediastinal contours appear intact.  IMPRESSION: Improving right lung base infiltration.  Cardiac enlargement.   Original Report Authenticated By: Marlon Pel, M.D.         Labs: Basic Metabolic Panel:  Lab 01/04/12 4098 01/03/12 2135 01/03/12 0444 01/02/12 2219  NA 141 138 -- 141  K 4.1 4.0 -- 4.4  CL 103 100 -- 103  CO2 30 32 -- 32  GLUCOSE 167* 209* -- 171*  BUN 18 18 -- 14  CREATININE 1.09 1.17 1.19 1.24  CALCIUM 9.3 9.1 -- 9.6  MG 2.4 -- 2.2 --  PHOS -- -- -- --       CBC:  Lab 01/04/12 0438 01/03/12 2135  01/03/12 0444 01/02/12 2219  WBC 7.7 8.2 8.6 8.4  NEUTROABS --  5.5 -- --  HGB 13.3 13.3 13.1 14.7  HCT 40.4 40.7 41.4 45.3  MCV 93.3 93.8 95.4 95.0  PLT 248 251 242 248   Cardiac Enzymes:  Lab 01/02/12 2238  CKTOTAL --  CKMB --  CKMBINDEX --  TROPONINI <0.30   BNP: BNP (last 3 results)  Basename 01/02/12 2219 06/25/11 0840 06/24/11 2247  PROBNP 1881.0* 4788.0* 4434.0*   CBG:  Lab 01/04/12 0746 01/03/12 0834  GLUCAP 170* 130*    Time coordinating discharge: *Less than 30 minutes  Signed:  GOSRANI,NIMISH C  [facility] 01/04/2012, 9:50 AM

## 2012-01-04 NOTE — Progress Notes (Signed)
UR Chart Review Completed  

## 2012-01-04 NOTE — Progress Notes (Signed)
Patient O2 saturation is 88% at rest

## 2012-01-04 NOTE — Clinical Social Work Note (Signed)
CSW received referral for home oxygen. CM aware and to follow. Pt to d/c today. CSW signing off.  Derenda Fennel, [place] [phone]

## 2012-01-24 ENCOUNTER — Telehealth: Payer: Self-pay | Admitting: Adult Health

## 2012-01-24 ENCOUNTER — Ambulatory Visit (INDEPENDENT_AMBULATORY_CARE_PROVIDER_SITE_OTHER): Payer: Medicare Other | Admitting: Adult Health

## 2012-01-24 ENCOUNTER — Encounter: Payer: Self-pay | Admitting: Adult Health

## 2012-01-24 VITALS — BP 120/90 | HR 88 | Ht 68.0 in | Wt 258.1 lb

## 2012-01-24 DIAGNOSIS — I509 Heart failure, unspecified: Secondary | ICD-10-CM

## 2012-01-24 DIAGNOSIS — I10 Essential (primary) hypertension: Secondary | ICD-10-CM

## 2012-01-24 DIAGNOSIS — I480 Paroxysmal atrial fibrillation: Secondary | ICD-10-CM

## 2012-01-24 DIAGNOSIS — I4891 Unspecified atrial fibrillation: Secondary | ICD-10-CM

## 2012-01-24 NOTE — Telephone Encounter (Signed)
Advised patient of CHF diagnosis

## 2012-01-24 NOTE — Telephone Encounter (Signed)
Patient states that he wants to know what his DX is. / tg

## 2012-01-24 NOTE — Assessment & Plan Note (Signed)
Blood pressure is well controlled currently.  He will need follow up labs BMET in 3 months.

## 2012-01-24 NOTE — Assessment & Plan Note (Signed)
Patient continues in atrial fib. He is advised to where his oxygen as directed to decrease O2 strain on heart. He will need to have coumadin follow up with his PCP, Dr. Mikeal Hawthorne and has a HHN who also follows him.  I have advised him to be more compliant with his entire medical regimen. He verbalizes understanding.

## 2012-01-24 NOTE — Assessment & Plan Note (Signed)
The patient's weight is unchanged since discharge from [facility] in Oct of 2013. He has not taken his diuretic yet today, and does have evidence of edema in the lower extremities. I have counseled him on medical compliance with O2 and medications to have a consistent state for his body to become used to. He is also advised on low sodium diet.  NO changes at this time.

## 2012-01-24 NOTE — Progress Notes (Signed)
HPI: [name] is a 56 y/o patient of Dr.Hochrien who wishes to be followed in [place] with known history of chronic atrial fib, diastolic CHF, hypertension, COPD, with ongoing tobacco abuse. He has had a recent admission to [facility] for exacerbation of CHF with COPD.  He was diuresed and sent home on home O2 that he is to wear at all times. He is on coumadin and has it monitored via a remote company that reports his results to Dr. Mikeal Hawthorne, his primary physician.    He comes today with complaints of his HR being up and down, chronic coughing and DOE. He admits to not wearing his O2 as he is supposed to , often not wearing for days at at time. He does not always use the neb tx as directed. He is also not complaint with use of diuretic.  He unfortunately continues to smoke heavily.  Allergies  Allergen Reactions  . Muscle Relief (Capsaicin)     Hysteria     Current Outpatient Prescriptions  Medication Sig Dispense Refill  . albuterol (PROVENTIL HFA;VENTOLIN HFA) 108 (90 BASE) MCG/ACT inhaler Inhale 2 puffs into the lungs every 6 (six) hours as needed. For shortness of breath      . albuterol (PROVENTIL) (2.5 MG/3ML) 0.083% nebulizer solution Take 2.5 mg by nebulization 3 (three) times daily as needed. For shortness of breath      . aspirin EC 81 MG tablet Take 81 mg by mouth daily.      . clonazePAM (KLONOPIN) 0.5 MG tablet Take 0.5 mg by mouth 2 (two) times daily.      . cloNIDine (CATAPRES) 0.2 MG tablet Take 0.2 mg by mouth 2 (two) times daily.       . furosemide (LASIX) 40 MG tablet Take 40 mg by mouth daily.      Marland Kitchen gabapentin (NEURONTIN) 600 MG tablet Take 600 mg by mouth 3 (three) times daily.      Marland Kitchen glimepiride (AMARYL) 2 MG tablet Take 2 mg by mouth daily before breakfast.      . HYDROcodone-acetaminophen (NORCO) 10-325 MG per tablet Take 1 tablet by mouth every 4 (four) hours as needed. For pain      . lisinopril (PRINIVIL,ZESTRIL) 40 MG tablet Take 40 mg by mouth daily.      Marland Kitchen  lithium carbonate (LITHOBID) 300 MG CR tablet Take 300 mg by mouth 2 (two) times daily.      . nitroGLYCERIN (NITROSTAT) 0.4 MG SL tablet Place 0.4 mg under the tongue every 5 (five) minutes as needed. For chest pain      . omeprazole (PRILOSEC) 20 MG capsule Take 20 mg by mouth daily.      . potassium chloride (K-DUR) 10 MEQ tablet Take 10 mEq by mouth daily.      . propranolol ER (INDERAL LA) 60 MG 24 hr capsule Take 60 mg by mouth daily.      . QUEtiapine (SEROQUEL) 300 MG tablet Take 150 mg by mouth at bedtime.      . simvastatin (ZOCOR) 80 MG tablet Take 80 mg by mouth at bedtime.      Marland Kitchen warfarin (COUMADIN) 5 MG tablet Take 2.5-5 mg by mouth daily. Take 5mg  on day 1, 2.5mg  on day 2, 5mg  on day 3, and continue accordingly        Past Medical History  Diagnosis Date  . Hypertension   . Diabetes mellitus   . Bipolar 1 disorder   . Hyperlipidemia   .  Atrial fibrillation   . COPD (chronic obstructive pulmonary disease)   . Obstructive sleep apnea   . Chronic diastolic heart failure     Echocardiogram 05/14/11: Mild LVH, EF 50-55%, no wall motion abnormalities, grade 2 diastolic dysfunction.  . Chest pain     Myoview 05/17/11 demonstrated an EF of 61%, small fixed inferoseptal defect consistent with thinning vs prior infarct; no ischemia. - low risk  . Diabetic neuropathy   . Chronic back pain   . History of stroke   . Mental disorder     bipolar  . CHF (congestive heart failure)   . Stroke   . Neuromuscular disorder     diabetic neuropathy    Past Surgical History  Procedure Date  . No past surgeries     ZOX:WRUEAV of systems complete and found to be negative unless listed above PHYSICAL EXAM BP 120/90  Pulse 88  Ht 5\' 8"  (1.727 m)  Wt 258 lb 1.9 oz (117.082 kg)  BMI 39.25 kg/m2 General: Well developed, well nourished, in no acute distress, obese, with dyspnea while taking, smelling of cigarettes.. Head: Eyes PERRLA, No xanthomas.   Normal cephalic and atramatic  Lungs:  Bibasilar crackles and inspiratory wheezes are noted. Marland Kitchen Heart: HIRR  Slightly tachycardic without MRG.  Pulses are 2+ & equal.            No carotid bruit. No JVD.  No abdominal bruits. No femoral bruits. Abdomen: Bowel sounds are positive, abdomen soft and non-tender without masses or                  Hernia's noted. Msk:  Back normal, lumbaring gait. Normal strength and tone for age. Extremities: Positive for clubbing, no cyanosis positive for pretibial  edema, left greater than right..  DP +1 Neuro: Alert and oriented X 3. Psych:  Good affect, responds appropriately   ASSESSMENT AND PLAN

## 2012-01-24 NOTE — Patient Instructions (Addendum)
Your physician recommends that you schedule a follow-up appointment in: 3 months  Your physician recommends that you weigh, daily, at the same time every day, and in the same amount of clothing. Please record your daily weights on the handout provided and bring it to your next appointment.   Use your oxygen 24 hours a day and use inhalers as directed

## 2012-03-17 NOTE — Progress Notes (Signed)
UR Chart Review Completed  

## 2012-04-29 ENCOUNTER — Ambulatory Visit: Payer: Medicare Other | Admitting: Cardiovascular Disease

## 2012-05-08 ENCOUNTER — Ambulatory Visit (INDEPENDENT_AMBULATORY_CARE_PROVIDER_SITE_OTHER): Payer: Medicare Other | Admitting: Cardiovascular Disease

## 2012-05-08 ENCOUNTER — Encounter: Payer: Self-pay | Admitting: Cardiovascular Disease

## 2012-05-08 VITALS — BP 120/90 | HR 88 | Ht 68.0 in | Wt 261.0 lb

## 2012-05-08 DIAGNOSIS — R0689 Other abnormalities of breathing: Secondary | ICD-10-CM

## 2012-05-08 DIAGNOSIS — F17200 Nicotine dependence, unspecified, uncomplicated: Secondary | ICD-10-CM

## 2012-05-08 DIAGNOSIS — J4489 Other specified chronic obstructive pulmonary disease: Secondary | ICD-10-CM

## 2012-05-08 DIAGNOSIS — Z720 Tobacco use: Secondary | ICD-10-CM

## 2012-05-08 DIAGNOSIS — J441 Chronic obstructive pulmonary disease with (acute) exacerbation: Secondary | ICD-10-CM

## 2012-05-08 DIAGNOSIS — E119 Type 2 diabetes mellitus without complications: Secondary | ICD-10-CM

## 2012-05-08 DIAGNOSIS — J449 Chronic obstructive pulmonary disease, unspecified: Secondary | ICD-10-CM

## 2012-05-08 DIAGNOSIS — R0989 Other specified symptoms and signs involving the circulatory and respiratory systems: Secondary | ICD-10-CM

## 2012-05-08 MED ORDER — FUROSEMIDE 40 MG PO TABS: 40.0000 mg | ORAL_TABLET | Freq: Two times a day (BID) | ORAL | Status: DC

## 2012-05-08 MED ORDER — POTASSIUM CHLORIDE ER 10 MEQ PO TBCR: 10.0000 meq | EXTENDED_RELEASE_TABLET | Freq: Two times a day (BID) | ORAL | Status: DC

## 2012-05-08 NOTE — Assessment & Plan Note (Signed)
Well controlled.  Continue current medications and low sodium Dash type diet.    

## 2012-05-08 NOTE — Assessment & Plan Note (Signed)
Discussed low carb diet.  Target hemoglobin A1c is 6.5 or less.  Continue current medications.  

## 2012-05-08 NOTE — Assessment & Plan Note (Signed)
Echo 2/25 EF 50-55%  Suspect LE edema from lung disease Repeat echo.  Increase KCL and lasix to bid  F/U BMET in 2-3 weeks  F/U [facility] PA who has seen most recently

## 2012-05-08 NOTE — Assessment & Plan Note (Signed)
Counseled for less than 10 minutes.  He is killing himself with cigarettes Week one of Chantix

## 2012-05-08 NOTE — Patient Instructions (Addendum)
Your physician recommends that you schedule a follow-up appointment in: 2 weeks  A referral has been made to [facility] in [place].  They will call you with an appointment.  Your physician has requested that you have an echocardiogram. Echocardiography is a painless test that uses sound waves to create images of your heart. It provides your doctor with information about the size and shape of your heart and how well your heart's chambers and valves are working. This procedure takes approximately one hour. There are no restrictions for this procedure.  Your physician has recommended you make the following change in your medication:  1 - INCREASE Lasix to twice a day 2 - INCREASE Potassium to twice a day  Your physician recommends that you return for lab work in: In 2 weeks (day prior to your follow up visit) - [facility] across the street from our office on the second floor.  No appointment is needed and they are open from 7A to 730P.

## 2012-05-08 NOTE — Assessment & Plan Note (Signed)
Primary issue Continue inhalers  F/U [facility]  Echo to assess PA pressure and RV function  Home oxygen

## 2012-05-08 NOTE — Progress Notes (Signed)
Patient ID: Sean Warren, male   DOB: [birth year], 57 y.o.   MRN: [id number] Mr. Hogston is a 57 y/o patient of Dr.Hochrien who wishes to be followed in [place] with known history of chronic atrial fib, diastolic CHF, hypertension, COPD, with ongoing tobacco abuse. He has had a recent admission to [facility] hospital for exacerbation of CHF with COPD. He was diuresed and sent home on home O2 that he is to wear at all times. He is on coumadin and has it monitored via a remote company that reports his results to Dr. Mikeal Hawthorne, his primary physician.  Trying Chantix again but has Gold 4 COPD.  Needs to see pulmonary.  Uses up to 3 liters at home.  More LE edema.    ROS: Denies fever, malais, weight loss, blurry vision, decreased visual acuity, cough, sputum, SOB, hemoptysis, pleuritic pain, palpitaitons, heartburn, abdominal pain, melena, lower extremity edema, claudication, or rash.  All other systems reviewed and negative  General: Affect appropriate Chronicially ill male on oxygen HEENT: normal Neck supple with no adenopathy JVP normal no bruits no thyromegaly Lungs Wheezing and good diaphragmatic motion Heart:  S1/S2 no murmur, no rub, gallop or click PMI normal Abdomen: benighn, BS positve, no tenderness, no AAA no bruit.  No HSM or HJR Distal pulses intact with no bruits Plus one to two bilateral  edema Neuro non-focal Skin warm and dry No muscular weakness   Current Outpatient Prescriptions  Medication Sig Dispense Refill  . albuterol (PROVENTIL HFA;VENTOLIN HFA) 108 (90 BASE) MCG/ACT inhaler Inhale 2 puffs into the lungs every 6 (six) hours as needed. For shortness of breath      . albuterol (PROVENTIL) (2.5 MG/3ML) 0.083% nebulizer solution Take 2.5 mg by nebulization 3 (three) times daily as needed. For shortness of breath      . aspirin EC 81 MG tablet Take 81 mg by mouth daily.      . clonazePAM (KLONOPIN) 0.5 MG tablet Take 0.5 mg by mouth 2 (two) times daily.      . cloNIDine  (CATAPRES) 0.2 MG tablet Take 0.2 mg by mouth 2 (two) times daily.       . furosemide (LASIX) 40 MG tablet Take 40 mg by mouth daily.      Marland Kitchen gabapentin (NEURONTIN) 600 MG tablet Take 600 mg by mouth 3 (three) times daily.      Marland Kitchen glimepiride (AMARYL) 2 MG tablet Take 2 mg by mouth daily before breakfast.      . HYDROcodone-acetaminophen (NORCO) 10-325 MG per tablet Take 1 tablet by mouth every 4 (four) hours as needed. For pain      . lisinopril (PRINIVIL,ZESTRIL) 40 MG tablet Take 40 mg by mouth daily.      Marland Kitchen lithium carbonate (LITHOBID) 300 MG CR tablet Take 300 mg by mouth 2 (two) times daily.      . nitroGLYCERIN (NITROSTAT) 0.4 MG SL tablet Place 0.4 mg under the tongue every 5 (five) minutes as needed. For chest pain      . omeprazole (PRILOSEC) 20 MG capsule Take 20 mg by mouth daily.      . potassium chloride (K-DUR) 10 MEQ tablet Take 10 mEq by mouth daily.      . propranolol ER (INDERAL LA) 60 MG 24 hr capsule Take 60 mg by mouth daily.      . QUEtiapine (SEROQUEL) 300 MG tablet Take 150 mg by mouth at bedtime.      . simvastatin (ZOCOR) 80 MG tablet Take 80  mg by mouth at bedtime.      Marland Kitchen warfarin (COUMADIN) 5 MG tablet Take 2.5-5 mg by mouth daily. Take 5mg  on day 1, 2.5mg  on day 2, 5mg  on day 3, and continue accordingly       No current facility-administered medications for this visit.    Allergies  Muscle relief  Electrocardiogram:  10/18  afib [id number]  CXR: 10/13  Mild CE COPD no CHF or pneumonia  Assessment and Plan

## 2012-05-13 ENCOUNTER — Inpatient Hospital Stay (HOSPITAL_COMMUNITY): Admission: RE | Admit: 2012-05-13 | Payer: Medicare Other | Source: Ambulatory Visit

## 2012-05-20 ENCOUNTER — Ambulatory Visit (HOSPITAL_COMMUNITY): Admission: RE | Admit: 2012-05-20 | Payer: Medicare Other | Source: Ambulatory Visit

## 2012-05-21 ENCOUNTER — Telehealth: Payer: Self-pay | Admitting: *Deleted

## 2012-05-21 NOTE — Telephone Encounter (Signed)
Noted pt no showed for his Echo scheduled on 05-20-12 and pt has F/U apt with [facility] on 05-22-12, contacted pt to advise he will need to have both these tests performed prior to his OV, pt accepted apt for Echo on 05-22-12 at 3pm, reiterated importance for this test, pt understood and advised he will keep the apt, pt also stated he would go to the lab on the same day as well, orders released in the chart, offered pt apt with KL on 05-23-12 and pt refused apt with NP and advised he ONLY wants to see a doctor per they are more thorough, this nurse tried to advise the importance of pt being evaluated sooner per MD [facility] availability is limited, pt still refused, accept apt with MD NIshan on 06-03-12 at 10:45am, apt placed in chart

## 2012-05-22 ENCOUNTER — Ambulatory Visit: Payer: Medicare Other | Admitting: Adult Health

## 2012-05-22 ENCOUNTER — Ambulatory Visit (HOSPITAL_COMMUNITY)
Admission: RE | Admit: 2012-05-22 | Discharge: 2012-05-22 | Disposition: A | Discharge status: Home / Self Care | Payer: Medicare Other | Source: Ambulatory Visit | Attending: Cardiovascular Disease | Admitting: Cardiovascular Disease

## 2012-05-22 DIAGNOSIS — R0989 Other specified symptoms and signs involving the circulatory and respiratory systems: Secondary | ICD-10-CM | POA: Insufficient documentation

## 2012-05-22 DIAGNOSIS — E119 Type 2 diabetes mellitus without complications: Secondary | ICD-10-CM | POA: Insufficient documentation

## 2012-05-22 DIAGNOSIS — J4489 Other specified chronic obstructive pulmonary disease: Secondary | ICD-10-CM | POA: Insufficient documentation

## 2012-05-22 DIAGNOSIS — I10 Essential (primary) hypertension: Secondary | ICD-10-CM | POA: Insufficient documentation

## 2012-05-22 DIAGNOSIS — J449 Chronic obstructive pulmonary disease, unspecified: Secondary | ICD-10-CM | POA: Insufficient documentation

## 2012-05-22 DIAGNOSIS — R0600 Dyspnea, unspecified: Secondary | ICD-10-CM

## 2012-05-22 DIAGNOSIS — R0609 Other forms of dyspnea: Secondary | ICD-10-CM | POA: Insufficient documentation

## 2012-05-22 DIAGNOSIS — I369 Nonrheumatic tricuspid valve disorder, unspecified: Secondary | ICD-10-CM

## 2012-05-22 NOTE — Progress Notes (Signed)
*  PRELIMINARY RESULTS* Echocardiogram 2D Echocardiogram has been performed.  Sean Warren 05/22/2012, 3:07 PM

## 2012-05-23 ENCOUNTER — Ambulatory Visit: Payer: Medicare Other | Admitting: Adult Health

## 2012-05-23 LAB — BASIC METABOLIC PANEL
CO2: 36 mEq/L — ABNORMAL HIGH (ref 19–32)
Glucose, Bld: 133 mg/dL — ABNORMAL HIGH (ref 70–99)
Potassium: 5.1 mEq/L (ref 3.5–5.3)
Sodium: 140 mEq/L (ref 135–145)

## 2012-05-26 ENCOUNTER — Telehealth: Payer: Self-pay | Admitting: *Deleted

## 2012-05-26 ENCOUNTER — Encounter: Payer: Self-pay | Admitting: *Deleted

## 2012-05-26 DIAGNOSIS — I10 Essential (primary) hypertension: Secondary | ICD-10-CM

## 2012-05-26 DIAGNOSIS — R0602 Shortness of breath: Secondary | ICD-10-CM

## 2012-05-26 NOTE — Telephone Encounter (Signed)
Called pt to advise the following noted in results:  EF not well defined by echo. Dont think he would tolerate MRI. Continue medical Rx check BNP and BMET in 4 weeks  Placed order in chart, forwarded results to PCP, pt will receive letter to have labs drawn, reminder placed in chart, pt understood all instructions

## 2012-06-03 ENCOUNTER — Ambulatory Visit: Payer: Medicare Other | Admitting: Cardiovascular Disease

## 2012-06-10 ENCOUNTER — Other Ambulatory Visit: Payer: Self-pay | Admitting: *Deleted

## 2012-06-10 DIAGNOSIS — I10 Essential (primary) hypertension: Secondary | ICD-10-CM

## 2012-06-10 DIAGNOSIS — R0602 Shortness of breath: Secondary | ICD-10-CM

## 2012-06-19 ENCOUNTER — Encounter: Payer: Self-pay | Admitting: *Deleted

## 2012-06-27 ENCOUNTER — Encounter: Payer: Self-pay | Admitting: *Deleted

## 2012-06-27 ENCOUNTER — Ambulatory Visit: Payer: Medicare Other | Admitting: Cardiovascular Disease

## 2012-07-08 ENCOUNTER — Other Ambulatory Visit (HOSPITAL_COMMUNITY): Payer: Self-pay

## 2012-07-08 ENCOUNTER — Other Ambulatory Visit: Payer: Self-pay | Admitting: Cardiology

## 2012-07-08 DIAGNOSIS — J441 Chronic obstructive pulmonary disease with (acute) exacerbation: Secondary | ICD-10-CM

## 2012-07-18 ENCOUNTER — Ambulatory Visit (HOSPITAL_COMMUNITY): Admission: RE | Admit: 2012-07-18 | Payer: Medicare Other | Source: Ambulatory Visit

## 2012-07-18 ENCOUNTER — Ambulatory Visit (HOSPITAL_COMMUNITY): Payer: Medicare Other | Attending: Internal Medicine

## 2012-07-30 ENCOUNTER — Ambulatory Visit (HOSPITAL_COMMUNITY): Payer: Medicare Other

## 2012-07-30 ENCOUNTER — Ambulatory Visit (HOSPITAL_COMMUNITY): Admission: RE | Admit: 2012-07-30 | Payer: Medicare Other | Source: Ambulatory Visit

## 2012-08-08 ENCOUNTER — Encounter (HOSPITAL_COMMUNITY): Payer: Self-pay | Admitting: Emergency Medicine

## 2012-08-08 ENCOUNTER — Ambulatory Visit (HOSPITAL_COMMUNITY)
Admission: RE | Admit: 2012-08-08 | Discharge: 2012-08-08 | Disposition: A | Discharge status: Home / Self Care | Payer: Medicare Other | Source: Ambulatory Visit | Attending: Pulmonary Disease | Admitting: Pulmonary Disease

## 2012-08-08 ENCOUNTER — Emergency Department (HOSPITAL_COMMUNITY): Payer: Medicare Other

## 2012-08-08 ENCOUNTER — Inpatient Hospital Stay (HOSPITAL_COMMUNITY)
Admission: EM | Admit: 2012-08-08 | Discharge: 2012-08-09 | DRG: 309 | Disposition: A | Discharge status: Home / Self Care | Payer: Medicare Other | Attending: Internal Medicine | Admitting: Internal Medicine

## 2012-08-08 DIAGNOSIS — Z7901 Long term (current) use of anticoagulants: Secondary | ICD-10-CM

## 2012-08-08 DIAGNOSIS — J40 Bronchitis, not specified as acute or chronic: Secondary | ICD-10-CM

## 2012-08-08 DIAGNOSIS — E1149 Type 2 diabetes mellitus with other diabetic neurological complication: Secondary | ICD-10-CM | POA: Diagnosis present

## 2012-08-08 DIAGNOSIS — E1142 Type 2 diabetes mellitus with diabetic polyneuropathy: Secondary | ICD-10-CM | POA: Diagnosis present

## 2012-08-08 DIAGNOSIS — Z79899 Other long term (current) drug therapy: Secondary | ICD-10-CM

## 2012-08-08 DIAGNOSIS — J9610 Chronic respiratory failure, unspecified whether with hypoxia or hypercapnia: Secondary | ICD-10-CM

## 2012-08-08 DIAGNOSIS — Z6838 Body mass index (BMI) 38.0-38.9, adult: Secondary | ICD-10-CM

## 2012-08-08 DIAGNOSIS — I480 Paroxysmal atrial fibrillation: Secondary | ICD-10-CM

## 2012-08-08 DIAGNOSIS — J4489 Other specified chronic obstructive pulmonary disease: Secondary | ICD-10-CM | POA: Diagnosis present

## 2012-08-08 DIAGNOSIS — R0902 Hypoxemia: Secondary | ICD-10-CM

## 2012-08-08 DIAGNOSIS — G8929 Other chronic pain: Secondary | ICD-10-CM | POA: Diagnosis present

## 2012-08-08 DIAGNOSIS — Z7982 Long term (current) use of aspirin: Secondary | ICD-10-CM

## 2012-08-08 DIAGNOSIS — R000 Tachycardia, unspecified: Secondary | ICD-10-CM

## 2012-08-08 DIAGNOSIS — J441 Chronic obstructive pulmonary disease with (acute) exacerbation: Secondary | ICD-10-CM

## 2012-08-08 DIAGNOSIS — E785 Hyperlipidemia, unspecified: Secondary | ICD-10-CM

## 2012-08-08 DIAGNOSIS — Z9981 Dependence on supplemental oxygen: Secondary | ICD-10-CM

## 2012-08-08 DIAGNOSIS — E876 Hypokalemia: Secondary | ICD-10-CM

## 2012-08-08 DIAGNOSIS — I10 Essential (primary) hypertension: Secondary | ICD-10-CM

## 2012-08-08 DIAGNOSIS — G4733 Obstructive sleep apnea (adult) (pediatric): Secondary | ICD-10-CM

## 2012-08-08 DIAGNOSIS — I4892 Unspecified atrial flutter: Principal | ICD-10-CM

## 2012-08-08 DIAGNOSIS — Z720 Tobacco use: Secondary | ICD-10-CM

## 2012-08-08 DIAGNOSIS — I509 Heart failure, unspecified: Secondary | ICD-10-CM

## 2012-08-08 DIAGNOSIS — E669 Obesity, unspecified: Secondary | ICD-10-CM | POA: Diagnosis present

## 2012-08-08 DIAGNOSIS — I4891 Unspecified atrial fibrillation: Secondary | ICD-10-CM

## 2012-08-08 DIAGNOSIS — F17200 Nicotine dependence, unspecified, uncomplicated: Secondary | ICD-10-CM | POA: Diagnosis present

## 2012-08-08 DIAGNOSIS — F319 Bipolar disorder, unspecified: Secondary | ICD-10-CM

## 2012-08-08 DIAGNOSIS — E119 Type 2 diabetes mellitus without complications: Secondary | ICD-10-CM

## 2012-08-08 DIAGNOSIS — J9611 Chronic respiratory failure with hypoxia: Secondary | ICD-10-CM

## 2012-08-08 DIAGNOSIS — I5032 Chronic diastolic (congestive) heart failure: Secondary | ICD-10-CM

## 2012-08-08 DIAGNOSIS — J189 Pneumonia, unspecified organism: Secondary | ICD-10-CM

## 2012-08-08 DIAGNOSIS — J449 Chronic obstructive pulmonary disease, unspecified: Secondary | ICD-10-CM

## 2012-08-08 DIAGNOSIS — M549 Dorsalgia, unspecified: Secondary | ICD-10-CM | POA: Diagnosis present

## 2012-08-08 DIAGNOSIS — Z8673 Personal history of transient ischemic attack (TIA), and cerebral infarction without residual deficits: Secondary | ICD-10-CM

## 2012-08-08 DIAGNOSIS — I639 Cerebral infarction, unspecified: Secondary | ICD-10-CM

## 2012-08-08 DIAGNOSIS — E66811 Obesity, class 1: Secondary | ICD-10-CM

## 2012-08-08 HISTORY — DX: Chronic respiratory failure with hypoxia: J96.11

## 2012-08-08 LAB — BLOOD GAS, ARTERIAL
Acid-Base Excess: 8.5 mmol/L — ABNORMAL HIGH (ref 0.0–2.0)
Bicarbonate: 33.1 mEq/L — ABNORMAL HIGH (ref 20.0–24.0)
O2 Saturation: 89.6 %
pO2, Arterial: 50.4 mmHg — ABNORMAL LOW (ref 80.0–100.0)

## 2012-08-08 LAB — GLUCOSE, CAPILLARY: Glucose-Capillary: 139 mg/dL — ABNORMAL HIGH (ref 70–99)

## 2012-08-08 LAB — COMPREHENSIVE METABOLIC PANEL
ALT: 6 U/L (ref 0–53)
CO2: 37 mEq/L — ABNORMAL HIGH (ref 19–32)
Calcium: 9.1 mg/dL (ref 8.4–10.5)
Chloride: 95 mEq/L — ABNORMAL LOW (ref 96–112)
GFR calc Af Amer: 70 mL/min — ABNORMAL LOW (ref >90)
GFR calc non Af Amer: 60 mL/min — ABNORMAL LOW (ref >90)
Glucose, Bld: 98 mg/dL (ref 70–99)
Sodium: 139 mEq/L (ref 135–145)
Total Bilirubin: 0.3 mg/dL (ref 0.3–1.2)

## 2012-08-08 LAB — MRSA PCR SCREENING: MRSA by PCR: NEGATIVE

## 2012-08-08 LAB — CBC
Hemoglobin: 13.3 g/dL (ref 13.0–17.0)
MCH: 33.1 pg (ref 26.0–34.0)
MCV: 98.8 fL (ref 78.0–100.0)
RBC: 4.02 MIL/uL — ABNORMAL LOW (ref 4.22–5.81)
WBC: 8.2 10*3/uL (ref 4.0–10.5)

## 2012-08-08 LAB — MAGNESIUM: Magnesium: 2.1 mg/dL (ref 1.5–2.5)

## 2012-08-08 MED ORDER — HYDROCODONE-ACETAMINOPHEN 10-325 MG PO TABS
1.0000 | ORAL_TABLET | ORAL | Status: DC | PRN
  Administered 2012-08-08 – 2012-08-09 (×2): 1 via ORAL
  Filled 2012-08-08 (×2): qty 1

## 2012-08-08 MED ORDER — DILTIAZEM HCL 100 MG IV SOLR
10.0000 mg/h | INTRAVENOUS | Status: DC
  Filled 2012-08-08: qty 100

## 2012-08-08 MED ORDER — ENOXAPARIN SODIUM 120 MG/0.8ML ~~LOC~~ SOLN
SUBCUTANEOUS | Status: AC
  Filled 2012-08-08: qty 0.8

## 2012-08-08 MED ORDER — WARFARIN - PHARMACIST DOSING INPATIENT: Freq: Every day | Status: DC

## 2012-08-08 MED ORDER — NITROGLYCERIN 0.4 MG SL SUBL: 0.4000 mg | SUBLINGUAL_TABLET | SUBLINGUAL | Status: DC | PRN

## 2012-08-08 MED ORDER — DILTIAZEM HCL 25 MG/5ML IV SOLN
20.0000 mg | Freq: Once | INTRAVENOUS | Status: AC
  Administered 2012-08-08: 20 mg via INTRAVENOUS
  Filled 2012-08-08: qty 5

## 2012-08-08 MED ORDER — GUAIFENESIN-DM 100-10 MG/5ML PO SYRP: 5.0000 mL | ORAL_SOLUTION | ORAL | Status: DC | PRN

## 2012-08-08 MED ORDER — BUDESONIDE-FORMOTEROL FUMARATE 160-4.5 MCG/ACT IN AERO
2.0000 | INHALATION_SPRAY | Freq: Two times a day (BID) | RESPIRATORY_TRACT | Status: DC
  Administered 2012-08-08 – 2012-08-09 (×2): 2 via RESPIRATORY_TRACT
  Filled 2012-08-08: qty 6

## 2012-08-08 MED ORDER — PANTOPRAZOLE SODIUM 40 MG PO TBEC
40.0000 mg | DELAYED_RELEASE_TABLET | Freq: Every day | ORAL | Status: DC
  Administered 2012-08-08 – 2012-08-09 (×2): 40 mg via ORAL
  Filled 2012-08-08 (×2): qty 1

## 2012-08-08 MED ORDER — WARFARIN SODIUM 2.5 MG PO TABS: 2.5000 mg | ORAL_TABLET | Freq: Every day | ORAL | Status: DC

## 2012-08-08 MED ORDER — SODIUM CHLORIDE 0.9 % IV SOLN
INTRAVENOUS | Status: DC
  Administered 2012-08-08: 15:00:00 via INTRAVENOUS

## 2012-08-08 MED ORDER — INSULIN ASPART 100 UNIT/ML ~~LOC~~ SOLN
0.0000 [IU] | Freq: Three times a day (TID) | SUBCUTANEOUS | Status: DC
  Administered 2012-08-09: 1 [IU] via SUBCUTANEOUS

## 2012-08-08 MED ORDER — ACETAMINOPHEN 325 MG PO TABS: 650.0000 mg | ORAL_TABLET | Freq: Four times a day (QID) | ORAL | Status: DC | PRN

## 2012-08-08 MED ORDER — DILTIAZEM HCL 25 MG/5ML IV SOLN
10.0000 mg | Freq: Once | INTRAVENOUS | Status: AC
  Administered 2012-08-08: 10 mg via INTRAVENOUS

## 2012-08-08 MED ORDER — ATORVASTATIN CALCIUM 40 MG PO TABS
40.0000 mg | ORAL_TABLET | Freq: Every day | ORAL | Status: DC
  Administered 2012-08-08: 40 mg via ORAL
  Filled 2012-08-08: qty 1

## 2012-08-08 MED ORDER — POTASSIUM CHLORIDE CRYS ER 20 MEQ PO TBCR: 40.0000 meq | EXTENDED_RELEASE_TABLET | Freq: Once | ORAL | Status: DC

## 2012-08-08 MED ORDER — ALUM & MAG HYDROXIDE-SIMETH 200-200-20 MG/5ML PO SUSP: 30.0000 mL | Freq: Four times a day (QID) | ORAL | Status: DC | PRN

## 2012-08-08 MED ORDER — NICOTINE 14 MG/24HR TD PT24
14.0000 mg | MEDICATED_PATCH | Freq: Every day | TRANSDERMAL | Status: DC
  Administered 2012-08-08 – 2012-08-09 (×2): 14 mg via TRANSDERMAL
  Filled 2012-08-08 (×2): qty 1

## 2012-08-08 MED ORDER — FUROSEMIDE 40 MG PO TABS
40.0000 mg | ORAL_TABLET | Freq: Two times a day (BID) | ORAL | Status: DC
  Administered 2012-08-09: 40 mg via ORAL
  Filled 2012-08-08 (×2): qty 1

## 2012-08-08 MED ORDER — POTASSIUM CHLORIDE CRYS ER 20 MEQ PO TBCR
40.0000 meq | EXTENDED_RELEASE_TABLET | Freq: Once | ORAL | Status: AC
  Administered 2012-08-08: 40 meq via ORAL
  Filled 2012-08-08: qty 1

## 2012-08-08 MED ORDER — SENNA 8.6 MG PO TABS
1.0000 | ORAL_TABLET | Freq: Every day | ORAL | Status: DC
  Filled 2012-08-08: qty 1

## 2012-08-08 MED ORDER — MORPHINE SULFATE 2 MG/ML IJ SOLN: 2.0000 mg | INTRAMUSCULAR | Status: DC | PRN

## 2012-08-08 MED ORDER — ONDANSETRON HCL 4 MG/2ML IJ SOLN: 4.0000 mg | Freq: Four times a day (QID) | INTRAMUSCULAR | Status: DC | PRN

## 2012-08-08 MED ORDER — DILTIAZEM HCL 100 MG IV SOLR
5.0000 mg/h | INTRAVENOUS | Status: DC
  Administered 2012-08-08: 15 mg/h via INTRAVENOUS
  Filled 2012-08-08: qty 100

## 2012-08-08 MED ORDER — SODIUM CHLORIDE 0.9 % IV SOLN
INTRAVENOUS | Status: AC
  Administered 2012-08-08: 20 mL/h via INTRAVENOUS

## 2012-08-08 MED ORDER — ACLIDINIUM BROMIDE 400 MCG/ACT IN AEPB: 1.0000 | INHALATION_SPRAY | Freq: Every day | RESPIRATORY_TRACT | Status: DC

## 2012-08-08 MED ORDER — ONDANSETRON HCL 4 MG PO TABS: 4.0000 mg | ORAL_TABLET | Freq: Four times a day (QID) | ORAL | Status: DC | PRN

## 2012-08-08 MED ORDER — ACETAMINOPHEN 650 MG RE SUPP: 650.0000 mg | Freq: Four times a day (QID) | RECTAL | Status: DC | PRN

## 2012-08-08 MED ORDER — PROPRANOLOL HCL ER 60 MG PO CP24
60.0000 mg | ORAL_CAPSULE | Freq: Every day | ORAL | Status: DC
  Administered 2012-08-08 – 2012-08-09 (×2): 60 mg via ORAL
  Filled 2012-08-08 (×4): qty 1

## 2012-08-08 MED ORDER — WARFARIN SODIUM 5 MG PO TABS
5.0000 mg | ORAL_TABLET | Freq: Once | ORAL | Status: AC
  Administered 2012-08-08: 5 mg via ORAL
  Filled 2012-08-08: qty 1

## 2012-08-08 MED ORDER — CLONAZEPAM 0.5 MG PO TABS
0.5000 mg | ORAL_TABLET | Freq: Two times a day (BID) | ORAL | Status: DC
  Administered 2012-08-09: 0.5 mg via ORAL
  Filled 2012-08-08 (×2): qty 1

## 2012-08-08 MED ORDER — ASPIRIN EC 81 MG PO TBEC
81.0000 mg | DELAYED_RELEASE_TABLET | Freq: Every day | ORAL | Status: DC
  Administered 2012-08-08 – 2012-08-09 (×2): 81 mg via ORAL
  Filled 2012-08-08 (×2): qty 1

## 2012-08-08 MED ORDER — DILTIAZEM HCL 100 MG IV SOLR
5.0000 mg/h | Freq: Once | INTRAVENOUS | Status: AC
  Administered 2012-08-08: 5 mg/h via INTRAVENOUS
  Filled 2012-08-08: qty 100

## 2012-08-08 MED ORDER — BUDESONIDE-FORMOTEROL FUMARATE 160-4.5 MCG/ACT IN AERO
INHALATION_SPRAY | RESPIRATORY_TRACT | Status: AC
  Filled 2012-08-08: qty 6

## 2012-08-08 MED ORDER — GABAPENTIN 300 MG PO CAPS
600.0000 mg | ORAL_CAPSULE | Freq: Three times a day (TID) | ORAL | Status: DC
  Administered 2012-08-09: 600 mg via ORAL
  Filled 2012-08-08 (×2): qty 2

## 2012-08-08 MED ORDER — QUETIAPINE FUMARATE 25 MG PO TABS
150.0000 mg | ORAL_TABLET | Freq: Every day | ORAL | Status: DC
  Administered 2012-08-08: 150 mg via ORAL
  Filled 2012-08-08 (×4): qty 1

## 2012-08-08 MED ORDER — LEVALBUTEROL HCL 0.63 MG/3ML IN NEBU: 0.6300 mg | INHALATION_SOLUTION | Freq: Four times a day (QID) | RESPIRATORY_TRACT | Status: DC | PRN

## 2012-08-08 MED ORDER — ENOXAPARIN SODIUM 120 MG/0.8ML ~~LOC~~ SOLN
120.0000 mg | Freq: Two times a day (BID) | SUBCUTANEOUS | Status: DC
  Administered 2012-08-08 – 2012-08-09 (×2): 120 mg via SUBCUTANEOUS
  Filled 2012-08-08 (×6): qty 0.8

## 2012-08-08 MED ORDER — FUROSEMIDE 40 MG PO TABS: 40.0000 mg | ORAL_TABLET | Freq: Two times a day (BID) | ORAL | Status: DC

## 2012-08-08 MED ORDER — PROPRANOLOL HCL ER 60 MG PO CP24
ORAL_CAPSULE | ORAL | Status: AC
  Filled 2012-08-08: qty 1

## 2012-08-08 MED ORDER — LITHIUM CARBONATE ER 300 MG PO TBCR
300.0000 mg | EXTENDED_RELEASE_TABLET | Freq: Two times a day (BID) | ORAL | Status: DC
  Administered 2012-08-08 – 2012-08-09 (×2): 300 mg via ORAL
  Filled 2012-08-08 (×6): qty 1

## 2012-08-08 MED ORDER — INSULIN ASPART 100 UNIT/ML ~~LOC~~ SOLN: 0.0000 [IU] | Freq: Every day | SUBCUTANEOUS | Status: DC

## 2012-08-08 NOTE — H&P (Signed)
[facility] History and Physical  Sean Warren ZOX:[id number] DOB: March 01, 1956 DOA: 08/08/2012  Referring physician: Dr. Denton Lank PCP: [name], MD  Specialists: Cardiologist, Dr. Eden Emms                       Pulmonologist, Dr. [name]  Chief Complaint: Shortness of breath. Found to have rapid heart rate during PFT evaluation.  HPI: Sean Warren is a 57 y.o. male past medical history significant for severe oxygen-dependent COPD, chronic hypoxic respiratory failure, paroxysmal atrial fibrillation, chronic diastolic heart failure, and diabetes mellitus, who presents to the emergency department from pulmonary function tests with a complaint of shortness of breath. The technician for pulmonary function tests apparently took the patient's vital signs in he was found to be tachycardic. His heart rate was found to be in the 160s. An ABG was performed and it revealed a pH of 7.4, PCO2 of 52, and PO2 of 50 (on room air although the patient is on 2-3 L of nasal cannula oxygen chronically). He was transferred to the emergency department. His EKG on arrival to the ED revealed atrial flutter with AV block and a heart rate of 142 beats per minute with nonspecific ST and T-wave abnormalities. His only complaint is of shortness of breath. He has a chronic cough with intermittent brownish sputum. He denies chest pain, chest palpitations or fluttering, increase in swelling of his legs, nausea, or vomiting. He denies any recent fever or chills. He did have diarrhea several days ago, but it has resolved. He was told by his primary care physician to not take Coumadin for one week because his blood was "too thin". He has since restarted his Coumadin, but he does not recall how much he takes nor does he recall which day he started taking it. He reports that his memory is not as good as it used to be.  Cardizem drip was started in the ED. His heart rate is now in the low 100s. He is afebrile. His blood pressure is  in the 90s to 120s systolically. His chest x-ray revealed stable cardiomegaly, but no acute disease. His lab data are significant for a serum potassium of 3.3, proBNP of 106, normal troponin I of less than 0.30, and INR of 0.97. He is being admitted for further evaluation and management.  Review of Systems: As above in history present illness. In addition, he has chronic swelling in his ankles, chronic shortness of breath, chronic cough, chronic low back pain, poor memory. Otherwise review of systems is negative.  Past Medical History  Diagnosis Date  . Hypertension   . Diabetes mellitus   . Bipolar 1 disorder   . Hyperlipidemia   . Atrial fibrillation   . COPD (chronic obstructive pulmonary disease)   . Obstructive sleep apnea   . Chronic diastolic heart failure     Echocardiogram 05/14/11: Mild LVH, EF 50-55%, no wall motion abnormalities, grade 2 diastolic dysfunction.  . Chest pain     Myoview 05/17/11 demonstrated an EF of 61%, small fixed inferoseptal defect consistent with thinning vs prior infarct; no ischemia. - low risk  . Diabetic neuropathy   . Chronic back pain   . History of stroke   . Mental disorder     bipolar  . CHF (congestive heart failure)   . Stroke   . Neuromuscular disorder     diabetic neuropathy  . Chronic respiratory failure with hypoxia 08/08/2012   Past Surgical History  Procedure Laterality Date  .  No past surgeries     Social History: The patient is divorced. He lives in [place] with a roommate. He has 2 grown daughters. He smokes a half a pack of cigarettes per day after 43.5 pack year history. He recently cut down from a pack and a half to 12 cigarettes daily. He denies alcohol and illicit drug use.  Allergies  Allergen Reactions  . Flexeril (Cyclobenzaprine) Other (See Comments)    Alters Mental Status-ALL MUSCLE RELAXERS  . Muscle Relief (Capsaicin)     Hysteria     Family history: His mother is alive. She has history of a pacemaker. His  father died of heart disease.  Prior to Admission medications   Medication Sig Start Date End Date Taking? Authorizing Provider  Aclidinium Bromide (TUDORZA PRESSAIR) 400 MCG/ACT AEPB Inhale 1 puff into the lungs daily.   Yes Historical Provider, MD  albuterol (PROVENTIL HFA;VENTOLIN HFA) 108 (90 BASE) MCG/ACT inhaler Inhale 2 puffs into the lungs every 6 (six) hours as needed. For shortness of breath   Yes Historical Provider, MD  albuterol (PROVENTIL) (2.5 MG/3ML) 0.083% nebulizer solution Take 2.5 mg by nebulization 3 (three) times daily as needed. For shortness of breath   Yes Historical Provider, MD  aspirin EC 81 MG tablet Take 81 mg by mouth daily.   Yes Historical Provider, MD  budesonide-formoterol (SYMBICORT) 160-4.5 MCG/ACT inhaler Inhale 2 puffs into the lungs 2 (two) times daily.   Yes Historical Provider, MD  clonazePAM (KLONOPIN) 0.5 MG tablet Take 0.5 mg by mouth 2 (two) times daily.   Yes Historical Provider, MD  cloNIDine (CATAPRES) 0.2 MG tablet Take 0.2 mg by mouth 2 (two) times daily.  05/16/11  Yes Historical Provider, MD  furosemide (LASIX) 40 MG tablet Take 1 tablet (40 mg total) by mouth 2 (two) times daily. 05/08/12  Yes Wendall Stade, MD  gabapentin (NEURONTIN) 600 MG tablet Take 600 mg by mouth 3 (three) times daily.   Yes Historical Provider, MD  glimepiride (AMARYL) 2 MG tablet Take 2 mg by mouth daily before breakfast.   Yes Historical Provider, MD  HYDROcodone-acetaminophen (NORCO) 10-325 MG per tablet Take 1 tablet by mouth every 4 (four) hours as needed. For pain   Yes Historical Provider, MD  KLOR-CON M10 10 MEQ tablet Take 10 mEq by mouth 2 (two) times daily.  06/09/12  Yes Historical Provider, MD  lisinopril (PRINIVIL,ZESTRIL) 40 MG tablet Take 40 mg by mouth daily.   Yes Historical Provider, MD  lithium carbonate (LITHOBID) 300 MG CR tablet Take 300 mg by mouth 2 (two) times daily.   Yes Historical Provider, MD  omeprazole (PRILOSEC) 20 MG capsule Take 20 mg by  mouth daily.   Yes Historical Provider, MD  propranolol ER (INDERAL LA) 60 MG 24 hr capsule Take 60 mg by mouth daily.   Yes Historical Provider, MD  QUEtiapine (SEROQUEL) 300 MG tablet Take 150 mg by mouth at bedtime.   Yes Historical Provider, MD  simvastatin (ZOCOR) 80 MG tablet Take 80 mg by mouth at bedtime.   Yes Historical Provider, MD  warfarin (COUMADIN) 5 MG tablet Take 2.5-5 mg by mouth daily. Take 5mg  on day 1, 2.5mg  on day 2, 5mg  on day 3, and continue accordingly   Yes Historical Provider, MD  nitroGLYCERIN (NITROSTAT) 0.4 MG SL tablet Place 0.4 mg under the tongue every 5 (five) minutes as needed. For chest pain    Historical Provider, MD   Physical Exam: Filed Vitals:   08/08/12  1630 08/08/12 1645 08/08/12 1700 08/08/12 1715  BP: 125/101 121/57 110/46 119/55  Pulse:  55 60 52  Temp:      TempSrc:      Resp:      SpO2:         General:  Obese 57 year old Caucasian man who appears to be older than his stated age, laying in bed, in no acute distress.  Eyes: Pupils are equal, round, and reactive to light. Extraocular movements are intact. Conjunctivae are clear. Sclerae are white.  ENT: Oropharynx reveals mildly dry mucous membranes. No posterior exudates or erythema.  Neck: Supple, no adenopathy, no thyromegaly, no JVD.  Cardiovascular: S1, S2, with tachycardia and occasional ectopic beat.  Respiratory: Breathing is nonlabored. Decreased breath sounds in the bases, otherwise clear.  Abdomen: Morbidly obese, positive bowel sounds, soft, nontender, nondistended.  Skin: Fair turgor. He has varicosities around his ankles.  Musculoskeletal: Pedal pulses are barely palpable. No acute hot joints. Trace of pedal edema bilaterally.   Psychiatric: He has a flat affect but he is cooperative. His speech is clear and nonpressured. He is alert and oriented x2.  Neurologic: Cranial nerves II through XII are grossly intact.  Labs on Admission:  Basic Metabolic Panel:  Recent  Labs Lab 08/08/12 1520  NA 139  K 3.3*  CL 95*  CO2 37*  GLUCOSE 98  BUN 13  CREATININE 1.28  CALCIUM 9.1   Liver Function Tests:  Recent Labs Lab 08/08/12 1520  AST 12  ALT 6  ALKPHOS 116  BILITOT 0.3  PROT 6.8  ALBUMIN 3.4*   No results found for this basename: LIPASE, AMYLASE,  in the last 168 hours No results found for this basename: AMMONIA,  in the last 168 hours CBC:  Recent Labs Lab 08/08/12 1520  WBC 8.2  HGB 13.3  HCT 39.7  MCV 98.8  PLT 249   Cardiac Enzymes:  Recent Labs Lab 08/08/12 1520  TROPONINI <0.30    BNP (last 3 results)  Recent Labs  01/02/12 2219 08/08/12 1520  PROBNP 1881.0* 1061.0*   CBG: No results found for this basename: GLUCAP,  in the last 168 hours  Radiological Exams on Admission: Dg Chest Port 1 View  08/08/2012   *RADIOLOGY REPORT*  Clinical Data: Tachycardia.  Shortness of breath.  Current history of oxygen dependent COPD.  Prior history of CHF.  PORTABLE CHEST - 1 VIEW  08/08/2012 1459 hours:  Comparison: Portable chest x-rays 01/03/2012, 06/24/2011 and two- view chest x-ray 03/03/2009.  Findings: Cardiac silhouette moderately enlarged but stable.  Lungs clear.  Bronchovascular markings normal.  Pulmonary vascularity normal.  No pneumothorax.  No pleural effusions.  IMPRESSION: Stable cardiomegaly.  No acute cardiopulmonary disease.   Original Report Authenticated By: Hulan Saas, M.D.    EKG: Independently reviewed. As above in history of present illness.  Assessment/Plan Principal Problem:   Atrial flutter Active Problems:   Tachycardia   Hypokalemia   Paroxysmal a-fib   Bipolar disorder   Obstructive sleep apnea   COPD (chronic obstructive pulmonary disease)   Tobacco abuse   DM (diabetes mellitus)   [place]-induced atrial fibrillation   HTN (hypertension)   Obesity (BMI 30.0-34.9)   Chronic respiratory failure with hypoxia   Chronic diastolic heart failure   1. Atrial flutter with rapid  ventricular response in the setting of chronic atrial fibrillation. I suspect noncompliance with propanolol. However, the patient's severe pulmonary disease coupled with chronic bronchodilator therapy could potentially trigger atrial fibrillation or atrial flutter  at any time. 2. History of paroxysmal atrial fibrillation. He is supposed to be anticoagulated, but apparently had not taken Coumadin in more than one week before restarting it recently. He says this is because his blood was "too thin" and he was told to hold it by his primary care physician. His INR is obviously subtherapeutic. He is on propranolol for rate control. 3. Chronic respiratory failure with hypoxia. His ABG was taken on room air. He is on 2-3 L of oxygen chronically. 4. COPD, oxygen dependent. No evidence of exacerbation. 5. Hypokalemia. This could be contributing to the the arrhythmia. His magnesium level will be assessed. 6. Chronic diastolic heart failure. This appears to be stable. He is on twice a day dosing of Lasix chronically. 2-D echocardiogram on 05/22/2012 revealed (poor quality so unable to estimate ejection fraction but likely moderately reduced) and moderate hypertrophy. 7. Tobacco abuse. The patient was congratulated on cutting down on his smoking. He was encouraged to stop smoking completely. 8. Hypertension. He is treated with lisinopril, propanolol, and clonidine chronically. Currently his blood pressure is low normal. 9. Diabetes mellitus. His glucose is currently within normal limits. 10. Bipolar disorder. We'll continue lithium and Seroquel.      Plan: 1. The patient was started on a Cardizem drip. We'll continue the Cardizem drip. Will restart propanolol orally. We'll hold other antihypertensive medications while on the Cardizem drip. Consider amiodarone, but with caution given his severe COPD. 2. If the patient's heart rate cannot be controlled on diltiazem and propanolol, we'll consider transferring the  patient to [facility] hospital for further management. 3. We'll continue his diuretics, but will add gentle IV fluids. 4. Will replete potassium chloride orally. We'll check a magnesium level to rule out deficiency. 5. We'll hold glipizide and treat his diabetes with sliding scale NovoLog. 6. Restart oxygen at 2-3 L per minute. Continue inhalers. 7. We'll restart Coumadin per pharmacy and Lovenox as a bridge. It is likely he will not need Lovenox at the time of discharge.     Code Status: Full code. Family Communication: None available Disposition Plan: Discharge to home, Mrs. date to be determined.  Time spent: Greater than one hour.  [facility] Pager [phone]  If 7PM-7AM, please contact night-coverage [email or web address] Password [facility] 08/08/2012, 5:53 PM

## 2012-08-08 NOTE — ED Notes (Signed)
Redness noted at left arm following Cardizem injection, pt has no complaints.

## 2012-08-08 NOTE — ED Notes (Signed)
Patient determined to get out of bed to use urinal.  States that he knows his body and he has this heart rate all the time at home.  States he is not going to stay in the hospital to be admitted.

## 2012-08-08 NOTE — ED Provider Notes (Signed)
History  This chart was scribed for Suzi Roots, MD by Ardelia Mems, ED Scribe. This patient was seen in room APA18/APA18 and the patient's care was started at 2:51 PM.   CSN: [id number]  Arrival date & time 08/08/12  1430     Chief Complaint  Patient presents with  . Tachycardia     The history is provided by the patient. No language interpreter was used.     HPI Comments: Sean Warren is a 57 y.o. male with a h/o COPD, HTN and CHF who presents to the Emergency Department complaining of tachycardia.Pt states that he was having PFTs done and started having rates of 161-163. RT reports they were checking his blood gas on room air when the event started. There is associated cough, mildy productive of sputum. Pt is regulatory on home O2 at 2 liters. Pt denies fever, chills, headaches or any other symptoms. Pt denies alcohol use and is a current every day smoker- 1.5 packs/day for 29 years.  w afib, pt states he never has palpitations and at times can only tell in rapid afib w c/o dyspnea. Denies increased leg pain or swelling. No orthopnea/pnd. No cp or discomfort. Denies any recent med change, compliant w meds. Denies fever or chills.      Past Medical History  Diagnosis Date  . Hypertension   . Diabetes mellitus   . Bipolar 1 disorder   . Hyperlipidemia   . Atrial fibrillation   . COPD (chronic obstructive pulmonary disease)   . Obstructive sleep apnea   . Chronic diastolic heart failure     Echocardiogram 05/14/11: Mild LVH, EF 50-55%, no wall motion abnormalities, grade 2 diastolic dysfunction.  . Chest pain     Myoview 05/17/11 demonstrated an EF of 61%, small fixed inferoseptal defect consistent with thinning vs prior infarct; no ischemia. - low risk  . Diabetic neuropathy   . Chronic back pain   . History of stroke   . Mental disorder     bipolar  . CHF (congestive heart failure)   . Stroke   . Neuromuscular disorder     diabetic neuropathy    Past Surgical  History  Procedure Laterality Date  . No past surgeries      No family history on file.  History  Substance Use Topics  . Smoking status: Current Every Day Smoker -- 1.50 packs/day for 29 years    Types: Cigarettes  . Smokeless tobacco: Never Used  . Alcohol Use: No      Review of Systems  Constitutional: Negative for fever.  HENT: Negative for neck pain.   Eyes: Negative for redness.  Respiratory: Positive for shortness of breath. Negative for cough.   Cardiovascular: Negative for chest pain and palpitations.  Gastrointestinal: Negative for vomiting, abdominal pain and diarrhea.  Genitourinary: Negative for flank pain.  Musculoskeletal: Negative for back pain.  Skin: Negative for rash.  Neurological: Negative for headaches.  Hematological: Does not bruise/bleed easily.  Psychiatric/Behavioral: Negative for confusion.   A complete 10 system review of systems was obtained and all systems are negative except as noted in the HPI and PMH.    Allergies  Muscle relief  Home Medications   Current Outpatient Rx  Name  Route  Sig  Dispense  Refill  . albuterol (PROVENTIL HFA;VENTOLIN HFA) 108 (90 BASE) MCG/ACT inhaler   Inhalation   Inhale 2 puffs into the lungs every 6 (six) hours as needed. For shortness of breath         .  albuterol (PROVENTIL) (2.5 MG/3ML) 0.083% nebulizer solution   Nebulization   Take 2.5 mg by nebulization 3 (three) times daily as needed. For shortness of breath         . aspirin EC 81 MG tablet   Oral   Take 81 mg by mouth daily.         . clonazePAM (KLONOPIN) 0.5 MG tablet   Oral   Take 0.5 mg by mouth 2 (two) times daily.         . cloNIDine (CATAPRES) 0.2 MG tablet   Oral   Take 0.2 mg by mouth 2 (two) times daily.          . furosemide (LASIX) 40 MG tablet   Oral   Take 1 tablet (40 mg total) by mouth 2 (two) times daily.   60 tablet   6   . gabapentin (NEURONTIN) 600 MG tablet   Oral   Take 600 mg by mouth 3 (three)  times daily.         Marland Kitchen glimepiride (AMARYL) 2 MG tablet   Oral   Take 2 mg by mouth daily before breakfast.         . HYDROcodone-acetaminophen (NORCO) 10-325 MG per tablet   Oral   Take 1 tablet by mouth every 4 (four) hours as needed. For pain         . KLOR-CON M10 10 MEQ tablet               . lisinopril (PRINIVIL,ZESTRIL) 40 MG tablet   Oral   Take 40 mg by mouth daily.         Marland Kitchen lithium carbonate (LITHOBID) 300 MG CR tablet   Oral   Take 300 mg by mouth 2 (two) times daily.         . nitroGLYCERIN (NITROSTAT) 0.4 MG SL tablet   Sublingual   Place 0.4 mg under the tongue every 5 (five) minutes as needed. For chest pain         . omeprazole (PRILOSEC) 20 MG capsule   Oral   Take 20 mg by mouth daily.         . potassium chloride (K-DUR) 10 MEQ tablet   Oral   Take 1 tablet (10 mEq total) by mouth 2 (two) times daily.   60 tablet   6   . propranolol ER (INDERAL LA) 60 MG 24 hr capsule   Oral   Take 60 mg by mouth daily.         . propranolol ER (INDERAL LA) 60 MG 24 hr capsule      take 1 capsule by mouth once daily   30 capsule   5   . QUEtiapine (SEROQUEL) 300 MG tablet   Oral   Take 150 mg by mouth at bedtime.         . simvastatin (ZOCOR) 80 MG tablet   Oral   Take 80 mg by mouth at bedtime.         Marland Kitchen warfarin (COUMADIN) 5 MG tablet   Oral   Take 2.5-5 mg by mouth daily. Take 5mg  on day 1, 2.5mg  on day 2, 5mg  on day 3, and continue accordingly           Triage Vitals: BP 109/81  Pulse 161  Temp(Src) 98.8 F (37.1 C) (Oral)  Resp 20  SpO2 100%  Physical Exam  Nursing note and vitals reviewed. Constitutional: He is oriented to person, place, and time. He  appears well-developed and well-nourished.  HENT:  Head: Normocephalic and atraumatic.  Eyes: Conjunctivae are normal.  Neck: Normal range of motion. Neck supple. No tracheal deviation present. No thyromegaly present.  Cardiovascular: Normal heart sounds and intact  distal pulses.  Exam reveals no gallop and no friction rub.   No murmur heard. Tachycardia.  Pulmonary/Chest: Effort normal and breath sounds normal. No accessory muscle usage. No respiratory distress. He has no wheezes. He exhibits no tenderness.  Abdominal: Soft. Bowel sounds are normal. He exhibits no distension. There is no tenderness.  Genitourinary:  No cva tenderness  Musculoskeletal: Normal range of motion. He exhibits edema. He exhibits no tenderness.  Mild ankle edema, symmetric, at baseline per pt.   Neurological: He is alert and oriented to person, place, and time.  Skin: Skin is warm and dry. He is not diaphoretic. No pallor.  Psychiatric: He has a normal mood and affect.    ED Course  Procedures (including critical care time)  DIAGNOSTIC STUDIES: Oxygen Saturation is 100% on RA, normal by my interpretation.    COORDINATION OF CARE: 2:52 PM- Pt advised of plan for treatment and pt agrees.  Medications  0.9 %  sodium chloride infusion (not administered)  diltiazem (CARDIZEM) injection 20 mg (not administered)  diltiazem (CARDIZEM) 100 mg in dextrose 5 % 100 mL infusion (not administered)     Results for orders placed during the hospital encounter of 08/08/12  CBC      Result Value Range   WBC 8.2  4.0 - 10.5 K/uL   RBC 4.02 (*) 4.22 - 5.81 MIL/uL   Hemoglobin 13.3  13.0 - 17.0 g/dL   HCT 47.8  29.5 - 62.1 %   MCV 98.8  78.0 - 100.0 fL   MCH 33.1  26.0 - 34.0 pg   MCHC 33.5  30.0 - 36.0 g/dL   RDW 30.8  65.7 - 84.6 %   Platelets 249  150 - 400 K/uL  COMPREHENSIVE METABOLIC PANEL      Result Value Range   Sodium 139  135 - 145 mEq/L   Potassium 3.3 (*) 3.5 - 5.1 mEq/L   Chloride 95 (*) 96 - 112 mEq/L   CO2 37 (*) 19 - 32 mEq/L   Glucose, Bld 98  70 - 99 mg/dL   BUN 13  6 - 23 mg/dL   Creatinine, Ser 9.62  0.50 - 1.35 mg/dL   Calcium 9.1  8.4 - 95.2 mg/dL   Total Protein 6.8  6.0 - 8.3 g/dL   Albumin 3.4 (*) 3.5 - 5.2 g/dL   AST 12  0 - 37 U/L   ALT 6   0 - 53 U/L   Alkaline Phosphatase 116  39 - 117 U/L   Total Bilirubin 0.3  0.3 - 1.2 mg/dL   GFR calc non Af Amer 60 (*) >90 mL/min   GFR calc Af Amer 70 (*) >90 mL/min  TROPONIN I      Result Value Range   Troponin I <0.30  <0.30 ng/mL  PRO B NATRIURETIC PEPTIDE      Result Value Range   Pro B Natriuretic peptide (BNP) 1061.0 (*) 0 - 125 pg/mL  PROTIME-INR      Result Value Range   Prothrombin Time 12.8  11.6 - 15.2 seconds   INR 0.97  0.00 - 1.49  LITHIUM LEVEL      Result Value Range   Lithium Lvl 0.16 (*) 0.80 - 1.40 mEq/L   Dg Chest [facility]  1 View  08/08/2012   *RADIOLOGY REPORT*  Clinical Data: Tachycardia.  Shortness of breath.  Current history of oxygen dependent COPD.  Prior history of CHF.  PORTABLE CHEST - 1 VIEW  08/08/2012 1459 hours:  Comparison: Portable chest x-rays 01/03/2012, 06/24/2011 and two- view chest x-ray 03/03/2009.  Findings: Cardiac silhouette moderately enlarged but stable.  Lungs clear.  Bronchovascular markings normal.  Pulmonary vascularity normal.  No pneumothorax.  No pleural effusions.  IMPRESSION: Stable cardiomegaly.  No acute cardiopulmonary disease.   Original Report Authenticated By: Hulan Saas, M.D.      MDM  I personally performed the services described in this documentation, which was scribed in my presence. The recorded information has been reviewed and is accurate.  Iv ns. Continuous pulse ox and monitor.   Monitor, rapid afib, hr 160's.   Hx chf, copd, pt notes increased dyspnea x past couple days.  cardizem bolus and gtt.   Date: 08/08/2012  Rate: 142  Rhythm: atrial fibrillation  QRS Axis: normal  Intervals: normal  ST/T Wave abnormalities: nonspecific ST/T changes  Conduction Disutrbances:IRBB  Narrative Interpretation:   Old EKG Reviewed: changes noted   w cardizem bolus, gtt hr improved 108, dyspnea mildly improved.   Reviewed nursing notes and prior charts for additional history.   Recheck hr 120, rebolus w  cardizem 10, inc gtt to 10 mg/hr.  hospitalist called to admit.    Suzi Roots, MD 08/08/12 [phone]

## 2012-08-08 NOTE — Progress Notes (Signed)
ANTICOAGULATION CONSULT NOTE - Initial Consult  Pharmacy Consult for lovenox and coumadin Indication: a fib  Allergies  Allergen Reactions  . Flexeril (Cyclobenzaprine) Other (See Comments)    Alters Mental Status-ALL MUSCLE RELAXERS  . Muscle Relief (Capsaicin)     Hysteria     Patient Measurements:    Vital Signs: Temp: 98.8 F (37.1 C) (05/23 1440) Temp src: Oral (05/23 1440) BP: 119/55 mmHg (05/23 1715) Pulse Rate: 52 (05/23 1715)  Labs:  Recent Labs  08/08/12 1520  HGB 13.3  HCT 39.7  PLT 249  LABPROT 12.8  INR 0.97  CREATININE 1.28  TROPONINI <0.30    The CrCl is unknown because both a height and weight (above a minimum accepted value) are required for this calculation.   Medical History: Past Medical History  Diagnosis Date  . Hypertension   . Diabetes mellitus   . Bipolar 1 disorder   . Hyperlipidemia   . Atrial fibrillation   . COPD (chronic obstructive pulmonary disease)   . Obstructive sleep apnea   . Chronic diastolic heart failure     Echocardiogram 05/14/11: Mild LVH, EF 50-55%, no wall motion abnormalities, grade 2 diastolic dysfunction.  . Chest pain     Myoview 05/17/11 demonstrated an EF of 61%, small fixed inferoseptal defect consistent with thinning vs prior infarct; no ischemia. - low risk  . Diabetic neuropathy   . Chronic back pain   . History of stroke   . Mental disorder     bipolar  . CHF (congestive heart failure)   . Stroke   . Neuromuscular disorder     diabetic neuropathy  . Chronic respiratory failure with hypoxia 08/08/2012    Medications:  Assessment: lovenox and coumadin for afib INR at baseline  Goal of Therapy:  INR 2-3   Plan:  Coumadin 5mg  now lovenox 1mg /kg sq q12hrs INR daily  Valrie Hart A 08/08/2012,6:38 PM

## 2012-08-08 NOTE — ED Notes (Signed)
Per respiratory therapy, pt was having PFTs done and started having tachycardia with rates 161-163.   RT reports they were checking his blood gas on room air when the event started.  Pt is regulatory on home O2 at 2 liters.

## 2012-08-09 DIAGNOSIS — J449 Chronic obstructive pulmonary disease, unspecified: Secondary | ICD-10-CM

## 2012-08-09 DIAGNOSIS — E669 Obesity, unspecified: Secondary | ICD-10-CM

## 2012-08-09 DIAGNOSIS — E119 Type 2 diabetes mellitus without complications: Secondary | ICD-10-CM

## 2012-08-09 DIAGNOSIS — E876 Hypokalemia: Secondary | ICD-10-CM

## 2012-08-09 DIAGNOSIS — F17200 Nicotine dependence, unspecified, uncomplicated: Secondary | ICD-10-CM

## 2012-08-09 LAB — PROTIME-INR
INR: 1.02 (ref 0.00–1.49)
Prothrombin Time: 13.3 seconds (ref 11.6–15.2)

## 2012-08-09 LAB — CBC
MCHC: 34.5 g/dL (ref 30.0–36.0)
Platelets: 217 10*3/uL (ref 150–400)
RDW: 13.1 % (ref 11.5–15.5)

## 2012-08-09 LAB — BASIC METABOLIC PANEL
BUN: 11 mg/dL (ref 6–23)
Calcium: 9.2 mg/dL (ref 8.4–10.5)
Creatinine, Ser: 1.05 mg/dL (ref 0.50–1.35)
GFR calc Af Amer: 89 mL/min — ABNORMAL LOW (ref >90)
GFR calc non Af Amer: 76 mL/min — ABNORMAL LOW (ref >90)
Potassium: 3.4 mEq/L — ABNORMAL LOW (ref 3.5–5.1)

## 2012-08-09 LAB — GLUCOSE, CAPILLARY: Glucose-Capillary: 125 mg/dL — ABNORMAL HIGH (ref 70–99)

## 2012-08-09 MED ORDER — CLONIDINE HCL 0.2 MG PO TABS: 0.2000 mg | ORAL_TABLET | Freq: Every day | ORAL | Status: DC

## 2012-08-09 MED ORDER — WARFARIN SODIUM 5 MG PO TABS: 5.0000 mg | ORAL_TABLET | Freq: Once | ORAL | Status: DC

## 2012-08-09 MED ORDER — POTASSIUM CHLORIDE CRYS ER 20 MEQ PO TBCR
30.0000 meq | EXTENDED_RELEASE_TABLET | Freq: Two times a day (BID) | ORAL | Status: DC
  Administered 2012-08-09: 30 meq via ORAL
  Filled 2012-08-09: qty 1

## 2012-08-09 MED ORDER — LISINOPRIL 40 MG PO TABS: 20.0000 mg | ORAL_TABLET | Freq: Every day | ORAL | Status: DC

## 2012-08-09 MED ORDER — POTASSIUM CHLORIDE CRYS ER 10 MEQ PO TBCR: 30.0000 meq | EXTENDED_RELEASE_TABLET | Freq: Every day | ORAL | Status: DC

## 2012-08-09 MED ORDER — DILTIAZEM HCL ER 60 MG PO CP12
120.0000 mg | ORAL_CAPSULE | Freq: Two times a day (BID) | ORAL | Status: DC
  Administered 2012-08-09: 120 mg via ORAL
  Filled 2012-08-09 (×5): qty 2

## 2012-08-09 MED ORDER — DILTIAZEM HCL ER 120 MG PO CP12: 120.0000 mg | ORAL_CAPSULE | Freq: Two times a day (BID) | ORAL | Status: DC

## 2012-08-09 NOTE — Progress Notes (Addendum)
ANTICOAGULATION CONSULT NOTE - Follow Up Consult  Pharmacy Consult for Lovenox and Coumadin Indication: atrial fibrillation  Allergies  Allergen Reactions  . Flexeril (Cyclobenzaprine) Other (See Comments)    Alters Mental Status-ALL MUSCLE RELAXERS  . Muscle Relief (Capsaicin)     Hysteria    Patient Measurements: Weight: 256 lb 6.3 oz (116.3 kg)  Vital Signs: Temp: 97.6 F (36.4 C) (05/24 0400) Temp src: Oral (05/24 0400) BP: 126/52 mmHg (05/24 0700) Pulse Rate: 59 (05/24 0700)  Labs:  Recent Labs  08/08/12 1520 08/09/12 0604 08/09/12 0605  HGB 13.3  --  12.9*  HCT 39.7  --  37.4*  PLT 249  --  217  LABPROT 12.8 13.3  --   INR 0.97 1.02  --   CREATININE 1.28  --  1.05  TROPONINI <0.30  --   --     The CrCl is unknown because both a height and weight (above a minimum accepted value) are required for this calculation.  Medications:  Prescriptions prior to admission  Medication Sig Dispense Refill  . Aclidinium Bromide (TUDORZA PRESSAIR) 400 MCG/ACT AEPB Inhale 1 puff into the lungs daily.      Marland Kitchen albuterol (PROVENTIL HFA;VENTOLIN HFA) 108 (90 BASE) MCG/ACT inhaler Inhale 2 puffs into the lungs every 6 (six) hours as needed. For shortness of breath      . albuterol (PROVENTIL) (2.5 MG/3ML) 0.083% nebulizer solution Take 2.5 mg by nebulization 3 (three) times daily as needed. For shortness of breath      . aspirin EC 81 MG tablet Take 81 mg by mouth daily.      . budesonide-formoterol (SYMBICORT) 160-4.5 MCG/ACT inhaler Inhale 2 puffs into the lungs 2 (two) times daily.      . clonazePAM (KLONOPIN) 0.5 MG tablet Take 0.5 mg by mouth 2 (two) times daily.      . cloNIDine (CATAPRES) 0.2 MG tablet Take 0.2 mg by mouth 2 (two) times daily.       . furosemide (LASIX) 40 MG tablet Take 1 tablet (40 mg total) by mouth 2 (two) times daily.  60 tablet  6  . gabapentin (NEURONTIN) 600 MG tablet Take 600 mg by mouth 3 (three) times daily.      Marland Kitchen glimepiride (AMARYL) 2 MG tablet  Take 2 mg by mouth daily before breakfast.      . HYDROcodone-acetaminophen (NORCO) 10-325 MG per tablet Take 1 tablet by mouth every 4 (four) hours as needed. For pain      . KLOR-CON M10 10 MEQ tablet Take 10 mEq by mouth 2 (two) times daily.       Marland Kitchen lisinopril (PRINIVIL,ZESTRIL) 40 MG tablet Take 40 mg by mouth daily.      Marland Kitchen lithium carbonate (LITHOBID) 300 MG CR tablet Take 300 mg by mouth 2 (two) times daily.      Marland Kitchen omeprazole (PRILOSEC) 20 MG capsule Take 20 mg by mouth daily.      . propranolol ER (INDERAL LA) 60 MG 24 hr capsule Take 60 mg by mouth daily.      . QUEtiapine (SEROQUEL) 300 MG tablet Take 150 mg by mouth at bedtime.      . simvastatin (ZOCOR) 80 MG tablet Take 80 mg by mouth at bedtime.      Marland Kitchen warfarin (COUMADIN) 5 MG tablet Take 2.5-5 mg by mouth daily. Take 5mg  on day 1, 2.5mg  on day 2, 5mg  on day 3, and continue accordingly      . nitroGLYCERIN (NITROSTAT)  0.4 MG SL tablet Place 0.4 mg under the tongue every 5 (five) minutes as needed. For chest pain       Assessment: 57yo male on chronic Coumadin PTA for afib.  INR at baseline and pt was reportedly told to hold his Coumadin because his blood was "too thin".  Pt is obese.  Pt is also on Aspirin therapy.  Goal of Therapy:  INR 2-3 Anti-Xa level 0.6-1.2 units/ml 4hrs after LMWH dose given Monitor platelets by anticoagulation protocol: Yes   Plan:  Coumadin 5mg  today x 1 INR daily Lovenox 1mg /Kg SQ q12hrs until INR > 2 x 24hrs Monitor CBC  Sean Warren A 08/09/2012,9:27 AM

## 2012-08-09 NOTE — Progress Notes (Signed)
Patient discharged to home with room mate. Patient given firm instructions to return to ED if he should experience worsening of symptoms, chest pain, dyspnea greater than baseline, dizziness, or syncope. IV removed, site clean, dry, and intact, no signs of infection. Instructions given regarding medications, activity,  diet, follow-up with PCP and cardiology.   Patient expressed understanding. Vital signs stable at discharge, except for patient remains in atrial flutter with HR in 80s.

## 2012-08-09 NOTE — Discharge Summary (Signed)
Physician Discharge Summary  Sean Warren WUJ:[id number] DOB: 1955/10/21 DOA: 08/08/2012  PCP: [name], MD  Admit date: 08/08/2012 Discharge date: 08/09/2012  Time spent: Greater than 30 minutes  Recommendations for Outpatient Follow-up:  1. The patient was informed that the dictating physician will obtain an appointment for him to followup with cardiology in the Coumadin clinic. He voiced understanding.  Discharge Diagnoses:   1. Newly diagnosed atrial flutter with 2:1 block to 4:1 block and rapid ventricular response. 2. Chronic paroxysmal atrial fibrillation, on chronic Coumadin (INR subtherapeutic on admission). 3. Hypokalemia. Repleted. 4. 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 5. Chronic diastolic heart failure. 6. Obstructive sleep apnea. 7. Morbid obesity. 8. Hypertension. 9. Type 2 diabetes mellitus. 10. Bipolar disorder. 11. Hypokalemia.  Discharge Condition: Improved.  Diet recommendation: Heart healthy/carbohydrate modified.  Filed Weights   08/09/12 0459  Weight: 116.3 kg (256 lb 6.3 oz)    History of present illness:   Sean Warren is a 57 y.o. male past medical history significant for severe oxygen-dependent COPD, chronic hypoxic respiratory failure, paroxysmal atrial fibrillation, chronic diastolic heart failure, and diabetes mellitus, who presents to the emergency department from pulmonary function tests with a complaint of shortness of breath. The technician for pulmonary function tests apparently took the patient's vital signs in he was found to be tachycardic. His heart rate was found to be in the 160s. An ABG was performed and it revealed a pH of 7.4, PCO2 of 52, and PO2 of 50 (on room air although the patient is on 2-3 L of nasal cannula oxygen chronically). He was transferred to the emergency department. His EKG on arrival to the ED revealed atrial flutter with AV block and a heart rate of 142 beats per minute with  nonspecific ST and T-wave abnormalities. His only complaint is of shortness of breath. He has a chronic cough with intermittent brownish sputum. He denies chest pain, chest palpitations or fluttering, increase in swelling of his legs, nausea, or vomiting. He denies any recent fever or chills. He did have diarrhea several days ago, but it has resolved. He was told by his primary care physician to not take Coumadin for one week because his blood was "too thin". He has since restarted his Coumadin, but he does not recall how much he takes nor does he recall which day he started taking it. He reports that his memory is not as good as it used to be.  Cardizem drip was started in the ED. His heart rate improved to the low 100s. He was afebrile. His blood pressure was in the 90s to 120s systolically. His chest x-ray revealed stable cardiomegaly, but no acute disease. His lab data were significant for a serum potassium of 3.3, proBNP of 106, normal troponin I of less than 0.30, and INR of 0.97. He was being admitted for further evaluation and management.    Hospital Course:   The patient was admitted to the step down unit on the diltiazem drip. He was maintained on oral propanolol. To avoid symptomatic hypotension, lisinopril and clonidine were both withheld. He was restarted on Coumadin. Lovenox was also given as a temporary bridge. He was maintained on his bronchodilators and oxygen. He was counseled on tobacco cessation. His diabetes was treated with sliding scale NovoLog. He was maintained on lithium and Seroquel for treatment of his bipolar disorder. He was supplemented and repleted with potassium chloride orally and in gentle IV fluids. His TSH was within normal limits.  The patient's rate  became controlled. The diltiazem drip was titrated off. He was subsequently started on oral diltiazem at 120 mg twice a day. With ambulation, his heart rate increased to 105, but quickly returned to the 70s to 80s at rest.  A followup EKG revealed 4:1 flutter which was different from the 2-1 flutter on admission. The patient was asymptomatic and wanted to go home. I decided to discuss the patient's EKG and clinical findings with cardiologist on call, Dr. Anne Fu. He reviewed the patient's EKG and agreed with medical management. He recommended continuing the patient on the diltiazem as ordered and Coumadin.  Because of the addition of diltiazem, the patient was instructed to decrease lisinopril back half at 20 mg daily and to take clonidine once daily instead of twice a day to avoid symptomatic hypotension. He was instructed to maintain propanolol as previously prescribed. He was also instructed to resume Coumadin. The dictating physician will try to arrange a followup appointment as soon as possible with [facility] cardiology in the Coumadin clinic.  Procedures: None Consultations: Curbside/telephone consult with cardiologist Dr.Skains   Discharge Exam: Filed Vitals:   08/09/12 1143 08/09/12 1200 08/09/12 1300 08/09/12 1400  BP:  136/78 121/70 133/74  Pulse:   93 72  Temp: 97.9 F (36.6 C)     TempSrc: Oral     Resp:  22 16 15   Weight:      SpO2:   98% 97%    General: Obese 57 year old Caucasian man sitting up on the side of the bed, in no acute distress. Cardiovascular: S1, S2, with occasional ectopy Respiratory: Clear to auscultation bilaterally.  Discharge Instructions  Discharge Orders   Future Orders Complete By Expires     Diet - low sodium heart healthy  As directed     Diet Carb Modified  As directed     Discharge instructions  As directed     Comments:      DILTIAZEM IS A NEW MEDICINE FOR YOUR HEART RATE. CHANGE CLONIDINE TO TAKE IT ONLY AT BEDTIME. CHANGE LISINOPRIL TO TAKE ONLY A HALF A TABLET DAILY. CHANGE POTASSIUM TO TAKE 3 TABLETS 1 TIME DAILY. CONTINUE ALL OF YOUR OTHER MEDICATIONS UNCHANGED.  RESTART WARFARIN AS PREVIOUSLY PRESCRIBED. TRY TO STOP SMOKING.    Increase activity  slowly  As directed         Medication List    TAKE these medications       albuterol 108 (90 BASE) MCG/ACT inhaler  Commonly known as:  PROVENTIL HFA;VENTOLIN HFA  Inhale 2 puffs into the lungs every 6 (six) hours as needed. For shortness of breath     albuterol (2.5 MG/3ML) 0.083% nebulizer solution  Commonly known as:  PROVENTIL  Take 2.5 mg by nebulization 3 (three) times daily as needed. For shortness of breath     aspirin EC 81 MG tablet  Take 81 mg by mouth daily.     budesonide-formoterol 160-4.5 MCG/ACT inhaler  Commonly known as:  SYMBICORT  Inhale 2 puffs into the lungs 2 (two) times daily.     clonazePAM 0.5 MG tablet  Commonly known as:  KLONOPIN  Take 0.5 mg by mouth 2 (two) times daily.     cloNIDine 0.2 MG tablet  Commonly known as:  CATAPRES  Take 1 tablet (0.2 mg total) by mouth at bedtime.     diltiazem 120 MG 12 hr capsule  Commonly known as:  CARDIZEM SR  Take 1 capsule (120 mg total) by mouth every 12 (twelve) hours. NEW MEDICATION  TO CONTROL YOUR HEART RATE.     furosemide 40 MG tablet  Commonly known as:  LASIX  Take 1 tablet (40 mg total) by mouth 2 (two) times daily.     gabapentin 600 MG tablet  Commonly known as:  NEURONTIN  Take 600 mg by mouth 3 (three) times daily.     glimepiride 2 MG tablet  Commonly known as:  AMARYL  Take 2 mg by mouth daily before breakfast.     HYDROcodone-acetaminophen 10-325 MG per tablet  Commonly known as:  NORCO  Take 1 tablet by mouth every 4 (four) hours as needed. For pain     lisinopril 40 MG tablet  Commonly known as:  PRINIVIL,ZESTRIL  Take 0.5 tablets (20 mg total) by mouth daily.     lithium carbonate 300 MG CR tablet  Commonly known as:  LITHOBID  Take 300 mg by mouth 2 (two) times daily.     nitroGLYCERIN 0.4 MG SL tablet  Commonly known as:  NITROSTAT  Place 0.4 mg under the tongue every 5 (five) minutes as needed. For chest pain     omeprazole 20 MG capsule  Commonly known as:   PRILOSEC  Take 20 mg by mouth daily.     potassium chloride 10 MEQ tablet  Commonly known as:  KLOR-CON M10  Take 3 tablets (30 mEq total) by mouth daily.     propranolol ER 60 MG 24 hr capsule  Commonly known as:  INDERAL LA  Take 60 mg by mouth daily.     QUEtiapine 300 MG tablet  Commonly known as:  SEROQUEL  Take 150 mg by mouth at bedtime.     simvastatin 80 MG tablet  Commonly known as:  ZOCOR  Take 80 mg by mouth at bedtime.     TUDORZA PRESSAIR 400 MCG/ACT Aepb  Generic drug:  Aclidinium Bromide  Inhale 1 puff into the lungs daily.     warfarin 5 MG tablet  Commonly known as:  COUMADIN  Take 2.5-5 mg by mouth daily. Take 5mg  on day 1, 2.5mg  on day 2, 5mg  on day 3, and continue accordingly       Allergies  Allergen Reactions  . Flexeril (Cyclobenzaprine) Other (See Comments)    Alters Mental Status-ALL MUSCLE RELAXERS  . Muscle Relief (Capsaicin)     Hysteria        Follow-up Information   Please follow up. (WILL ARRANGE FOR YOUR FOLLOWUP WITH [facility].)       Follow up with GARBA,LAWAL, MD. Schedule an appointment as soon as possible for a visit in 1 week.   Contact information:   [address] [place] [address] [phone]        The results of significant diagnostics from this hospitalization (including imaging, microbiology, ancillary and laboratory) are listed below for reference.    Significant Diagnostic Studies: Dg Chest Port 1 View  08/08/2012   *RADIOLOGY REPORT*  Clinical Data: Tachycardia.  Shortness of breath.  Current history of oxygen dependent COPD.  Prior history of CHF.  PORTABLE CHEST - 1 VIEW  08/08/2012 1459 hours:  Comparison: Portable chest x-rays 01/03/2012, 06/24/2011 and two- view chest x-ray 03/03/2009.  Findings: Cardiac silhouette moderately enlarged but stable.  Lungs clear.  Bronchovascular markings normal.  Pulmonary vascularity normal.  No pneumothorax.  No pleural effusions.  IMPRESSION: Stable  cardiomegaly.  No acute cardiopulmonary disease.   Original Report Authenticated By: Hulan Saas, M.D.    Microbiology: Recent Results (  from the past 240 hour(s))  MRSA PCR SCREENING     Status: None   Collection Time    08/08/12  6:17 PM      Result Value Range Status   MRSA by PCR NEGATIVE  NEGATIVE Final   Comment:            The GeneXpert MRSA Assay (FDA     approved for NASAL specimens     only), is one component of a     comprehensive MRSA colonization     surveillance program. It is not     intended to diagnose MRSA     infection nor to guide or     monitor treatment for     MRSA infections.     Labs: Basic Metabolic Panel:  Recent Labs Lab 08/08/12 1520 08/08/12 1732 08/09/12 0605  NA 139  --  140  K 3.3*  --  3.4*  CL 95*  --  99  CO2 37*  --  37*  GLUCOSE 98  --  142*  BUN 13  --  11  CREATININE 1.28  --  1.05  CALCIUM 9.1  --  9.2  MG  --  2.1 2.3   Liver Function Tests:  Recent Labs Lab 08/08/12 1520  AST 12  ALT 6  ALKPHOS 116  BILITOT 0.3  PROT 6.8  ALBUMIN 3.4*   No results found for this basename: LIPASE, AMYLASE,  in the last 168 hours No results found for this basename: AMMONIA,  in the last 168 hours CBC:  Recent Labs Lab 08/08/12 1520 08/09/12 0605  WBC 8.2 7.1  HGB 13.3 12.9*  HCT 39.7 37.4*  MCV 98.8 97.9  PLT 249 217   Cardiac Enzymes:  Recent Labs Lab 08/08/12 1520  TROPONINI <0.30   BNP: BNP (last 3 results)  Recent Labs  01/02/12 2219 08/08/12 1520  PROBNP 1881.0* 1061.0*   CBG:  Recent Labs Lab 08/08/12 2149 08/09/12 0724 08/09/12 1100  GLUCAP 139* 125* 192*       Signed:  Nikhita Mentzel  [facility] 08/09/2012, 6:36 PM

## 2012-08-09 NOTE — Progress Notes (Signed)
Pt ambulated in hall, per Dr's order, on cardiac monitor. Maximum heart rate was 105 bpm while ambulating, returned to 80s at rest. Remains in Atrial flutter. Pt voiced no c/o while ambulating.

## 2012-08-14 NOTE — Progress Notes (Signed)
Utilization Review Completed.   Analyssa Downs, RN, BSN Nurse Case Manager  [phone]  

## 2012-08-18 ENCOUNTER — Other Ambulatory Visit (HOSPITAL_COMMUNITY): Payer: Self-pay

## 2012-08-18 NOTE — Progress Notes (Signed)
No Show

## 2012-08-19 ENCOUNTER — Encounter: Payer: Medicare Other | Admitting: Adult Health

## 2012-08-27 ENCOUNTER — Ambulatory Visit (HOSPITAL_COMMUNITY): Admission: RE | Admit: 2012-08-27 | Payer: Medicare Other | Source: Ambulatory Visit

## 2012-08-28 ENCOUNTER — Emergency Department (HOSPITAL_COMMUNITY): Payer: Medicare Other

## 2012-08-28 ENCOUNTER — Encounter (HOSPITAL_COMMUNITY): Payer: Self-pay | Admitting: Emergency Medicine

## 2012-08-28 ENCOUNTER — Observation Stay (HOSPITAL_COMMUNITY)
Admission: EM | Admit: 2012-08-28 | Discharge: 2012-08-28 | Disposition: A | Discharge status: Home / Self Care | Payer: Medicare Other | Attending: Internal Medicine | Admitting: Internal Medicine

## 2012-08-28 DIAGNOSIS — I5032 Chronic diastolic (congestive) heart failure: Secondary | ICD-10-CM

## 2012-08-28 DIAGNOSIS — G4733 Obstructive sleep apnea (adult) (pediatric): Secondary | ICD-10-CM

## 2012-08-28 DIAGNOSIS — I480 Paroxysmal atrial fibrillation: Secondary | ICD-10-CM

## 2012-08-28 DIAGNOSIS — R000 Tachycardia, unspecified: Secondary | ICD-10-CM

## 2012-08-28 DIAGNOSIS — R3919 Other difficulties with micturition: Secondary | ICD-10-CM | POA: Insufficient documentation

## 2012-08-28 DIAGNOSIS — E669 Obesity, unspecified: Secondary | ICD-10-CM

## 2012-08-28 DIAGNOSIS — E119 Type 2 diabetes mellitus without complications: Secondary | ICD-10-CM

## 2012-08-28 DIAGNOSIS — J40 Bronchitis, not specified as acute or chronic: Secondary | ICD-10-CM

## 2012-08-28 DIAGNOSIS — E876 Hypokalemia: Secondary | ICD-10-CM

## 2012-08-28 DIAGNOSIS — I10 Essential (primary) hypertension: Secondary | ICD-10-CM

## 2012-08-28 DIAGNOSIS — E785 Hyperlipidemia, unspecified: Secondary | ICD-10-CM

## 2012-08-28 DIAGNOSIS — I639 Cerebral infarction, unspecified: Secondary | ICD-10-CM

## 2012-08-28 DIAGNOSIS — I4891 Unspecified atrial fibrillation: Secondary | ICD-10-CM

## 2012-08-28 DIAGNOSIS — E66811 Obesity, class 1: Secondary | ICD-10-CM

## 2012-08-28 DIAGNOSIS — J449 Chronic obstructive pulmonary disease, unspecified: Secondary | ICD-10-CM

## 2012-08-28 DIAGNOSIS — Z7901 Long term (current) use of anticoagulants: Secondary | ICD-10-CM | POA: Insufficient documentation

## 2012-08-28 DIAGNOSIS — I4892 Unspecified atrial flutter: Secondary | ICD-10-CM

## 2012-08-28 DIAGNOSIS — J441 Chronic obstructive pulmonary disease with (acute) exacerbation: Secondary | ICD-10-CM

## 2012-08-28 DIAGNOSIS — J189 Pneumonia, unspecified organism: Secondary | ICD-10-CM

## 2012-08-28 DIAGNOSIS — J9610 Chronic respiratory failure, unspecified whether with hypoxia or hypercapnia: Secondary | ICD-10-CM | POA: Insufficient documentation

## 2012-08-28 DIAGNOSIS — J9611 Chronic respiratory failure with hypoxia: Secondary | ICD-10-CM

## 2012-08-28 DIAGNOSIS — R51 Headache: Secondary | ICD-10-CM | POA: Insufficient documentation

## 2012-08-28 DIAGNOSIS — R21 Rash and other nonspecific skin eruption: Secondary | ICD-10-CM

## 2012-08-28 DIAGNOSIS — Z720 Tobacco use: Secondary | ICD-10-CM

## 2012-08-28 DIAGNOSIS — M542 Cervicalgia: Secondary | ICD-10-CM | POA: Insufficient documentation

## 2012-08-28 DIAGNOSIS — R55 Syncope and collapse: Principal | ICD-10-CM

## 2012-08-28 DIAGNOSIS — F319 Bipolar disorder, unspecified: Secondary | ICD-10-CM

## 2012-08-28 LAB — LIPID PANEL
Cholesterol: 166 mg/dL (ref 0–200)
HDL: 43 mg/dL
LDL Cholesterol: 91 mg/dL (ref 0–99)
Total CHOL/HDL Ratio: 3.9 ratio
Triglycerides: 162 mg/dL — ABNORMAL HIGH
VLDL: 32 mg/dL (ref 0–40)

## 2012-08-28 LAB — COMPREHENSIVE METABOLIC PANEL
AST: 10 U/L (ref 0–37)
BUN: 15 mg/dL (ref 6–23)
CO2: 42 mEq/L (ref 19–32)
Calcium: 8.7 mg/dL (ref 8.4–10.5)
Chloride: 98 mEq/L (ref 96–112)
Creatinine, Ser: 1.1 mg/dL (ref 0.50–1.35)
GFR calc Af Amer: 84 mL/min — ABNORMAL LOW (ref >90)
GFR calc non Af Amer: 72 mL/min — ABNORMAL LOW (ref >90)
Glucose, Bld: 164 mg/dL — ABNORMAL HIGH (ref 70–99)
Total Bilirubin: 0.3 mg/dL (ref 0.3–1.2)

## 2012-08-28 LAB — CBC WITH DIFFERENTIAL/PLATELET
Eosinophils Relative: 3 % (ref 0–5)
HCT: 39.6 % (ref 39.0–52.0)
Hemoglobin: 12.8 g/dL — ABNORMAL LOW (ref 13.0–17.0)
Lymphocytes Relative: 13 % (ref 12–46)
Lymphs Abs: 1.4 10*3/uL (ref 0.7–4.0)
MCV: 100.5 fL — ABNORMAL HIGH (ref 78.0–100.0)
Monocytes Absolute: 0.5 10*3/uL (ref 0.1–1.0)
Monocytes Relative: 5 % (ref 3–12)
Neutro Abs: 8.5 10*3/uL — ABNORMAL HIGH (ref 1.7–7.7)
WBC: 10.9 10*3/uL — ABNORMAL HIGH (ref 4.0–10.5)

## 2012-08-28 LAB — GLUCOSE, CAPILLARY

## 2012-08-28 LAB — PROTIME-INR: INR: 1.19 (ref 0.00–1.49)

## 2012-08-28 MED ORDER — ONDANSETRON HCL 4 MG/2ML IJ SOLN: 4.0000 mg | Freq: Three times a day (TID) | INTRAMUSCULAR | Status: DC | PRN

## 2012-08-28 MED ORDER — INSULIN ASPART 100 UNIT/ML ~~LOC~~ SOLN
0.0000 [IU] | Freq: Three times a day (TID) | SUBCUTANEOUS | Status: DC
  Administered 2012-08-28: 2 [IU] via SUBCUTANEOUS

## 2012-08-28 MED ORDER — TIOTROPIUM BROMIDE MONOHYDRATE 18 MCG IN CAPS
18.0000 ug | ORAL_CAPSULE | Freq: Every day | RESPIRATORY_TRACT | Status: DC
  Administered 2012-08-28: 18 ug via RESPIRATORY_TRACT
  Filled 2012-08-28: qty 5

## 2012-08-28 MED ORDER — HYDROCODONE-ACETAMINOPHEN 10-325 MG PO TABS: 1.0000 | ORAL_TABLET | ORAL | Status: DC | PRN

## 2012-08-28 MED ORDER — GABAPENTIN 600 MG PO TABS: 600.0000 mg | ORAL_TABLET | Freq: Three times a day (TID) | ORAL | Status: DC

## 2012-08-28 MED ORDER — ALBUTEROL SULFATE HFA 108 (90 BASE) MCG/ACT IN AERS: 2.0000 | INHALATION_SPRAY | Freq: Four times a day (QID) | RESPIRATORY_TRACT | Status: DC | PRN

## 2012-08-28 MED ORDER — NITROGLYCERIN 0.4 MG SL SUBL: 0.4000 mg | SUBLINGUAL_TABLET | SUBLINGUAL | Status: DC | PRN

## 2012-08-28 MED ORDER — ATORVASTATIN CALCIUM 40 MG PO TABS: 40.0000 mg | ORAL_TABLET | Freq: Every day | ORAL | Status: DC

## 2012-08-28 MED ORDER — WARFARIN SODIUM 7.5 MG PO TABS
7.5000 mg | ORAL_TABLET | Freq: Once | ORAL | Status: AC
  Administered 2012-08-28: 7.5 mg via ORAL
  Filled 2012-08-28: qty 1

## 2012-08-28 MED ORDER — MAGNESIUM CITRATE PO SOLN: 1.0000 | Freq: Once | ORAL | Status: DC | PRN

## 2012-08-28 MED ORDER — CLONIDINE HCL 0.2 MG PO TABS: 0.2000 mg | ORAL_TABLET | Freq: Every day | ORAL | Status: DC

## 2012-08-28 MED ORDER — ACLIDINIUM BROMIDE 400 MCG/ACT IN AEPB: 1.0000 | INHALATION_SPRAY | Freq: Every day | RESPIRATORY_TRACT | Status: DC

## 2012-08-28 MED ORDER — FUROSEMIDE 40 MG PO TABS
40.0000 mg | ORAL_TABLET | Freq: Two times a day (BID) | ORAL | Status: DC
  Administered 2012-08-28: 40 mg via ORAL
  Filled 2012-08-28: qty 1

## 2012-08-28 MED ORDER — ASPIRIN EC 81 MG PO TBEC
81.0000 mg | DELAYED_RELEASE_TABLET | Freq: Every day | ORAL | Status: DC
  Administered 2012-08-28: 81 mg via ORAL
  Filled 2012-08-28: qty 1

## 2012-08-28 MED ORDER — PANTOPRAZOLE SODIUM 40 MG PO TBEC
40.0000 mg | DELAYED_RELEASE_TABLET | Freq: Every day | ORAL | Status: DC
  Administered 2012-08-28: 40 mg via ORAL
  Filled 2012-08-28: qty 1

## 2012-08-28 MED ORDER — LISINOPRIL 10 MG PO TABS
20.0000 mg | ORAL_TABLET | Freq: Every day | ORAL | Status: DC
  Administered 2012-08-28: 20 mg via ORAL
  Filled 2012-08-28: qty 2

## 2012-08-28 MED ORDER — ALUM & MAG HYDROXIDE-SIMETH 200-200-20 MG/5ML PO SUSP: 30.0000 mL | Freq: Four times a day (QID) | ORAL | Status: DC | PRN

## 2012-08-28 MED ORDER — QUETIAPINE FUMARATE 100 MG PO TABS
150.0000 mg | ORAL_TABLET | Freq: Every day | ORAL | Status: DC
  Filled 2012-08-28: qty 3

## 2012-08-28 MED ORDER — LORAZEPAM 2 MG/ML IJ SOLN
1.0000 mg | Freq: Once | INTRAMUSCULAR | Status: AC
  Administered 2012-08-28: 1 mg via INTRAVENOUS
  Filled 2012-08-28: qty 1

## 2012-08-28 MED ORDER — WARFARIN - PHARMACIST DOSING INPATIENT: Status: DC

## 2012-08-28 MED ORDER — BUDESONIDE-FORMOTEROL FUMARATE 160-4.5 MCG/ACT IN AERO
2.0000 | INHALATION_SPRAY | Freq: Two times a day (BID) | RESPIRATORY_TRACT | Status: DC
  Administered 2012-08-28: 2 via RESPIRATORY_TRACT
  Filled 2012-08-28: qty 6

## 2012-08-28 MED ORDER — GLIMEPIRIDE 2 MG PO TABS: 2.0000 mg | ORAL_TABLET | Freq: Every day | ORAL | Status: DC

## 2012-08-28 MED ORDER — PROPRANOLOL HCL ER 60 MG PO CP24
60.0000 mg | ORAL_CAPSULE | Freq: Every day | ORAL | Status: DC
  Administered 2012-08-28: 60 mg via ORAL
  Filled 2012-08-28 (×2): qty 1

## 2012-08-28 MED ORDER — POTASSIUM CHLORIDE CRYS ER 20 MEQ PO TBCR
30.0000 meq | EXTENDED_RELEASE_TABLET | Freq: Every day | ORAL | Status: DC
  Administered 2012-08-28: 30 meq via ORAL
  Filled 2012-08-28: qty 1

## 2012-08-28 MED ORDER — GABAPENTIN 300 MG PO CAPS
300.0000 mg | ORAL_CAPSULE | Freq: Three times a day (TID) | ORAL | Status: DC
  Administered 2012-08-28 (×2): 300 mg via ORAL
  Filled 2012-08-28 (×2): qty 1

## 2012-08-28 MED ORDER — DILTIAZEM HCL 25 MG/5ML IV SOLN
5.0000 mg | Freq: Once | INTRAVENOUS | Status: AC
  Administered 2012-08-28: 5 mg via INTRAVENOUS

## 2012-08-28 MED ORDER — ALBUTEROL SULFATE (5 MG/ML) 0.5% IN NEBU
2.5000 mg | INHALATION_SOLUTION | RESPIRATORY_TRACT | Status: DC | PRN
  Administered 2012-08-28: 2.5 mg via RESPIRATORY_TRACT
  Filled 2012-08-28: qty 0.5

## 2012-08-28 MED ORDER — DILTIAZEM HCL 100 MG IV SOLR
5.0000 mg/h | INTRAVENOUS | Status: DC
  Administered 2012-08-28: 5 mg/h via INTRAVENOUS
  Filled 2012-08-28: qty 100

## 2012-08-28 MED ORDER — CLONAZEPAM 0.5 MG PO TABS
0.5000 mg | ORAL_TABLET | Freq: Two times a day (BID) | ORAL | Status: DC
  Administered 2012-08-28: 0.5 mg via ORAL
  Filled 2012-08-28: qty 1

## 2012-08-28 MED ORDER — ONDANSETRON HCL 4 MG PO TABS: 4.0000 mg | ORAL_TABLET | Freq: Four times a day (QID) | ORAL | Status: DC | PRN

## 2012-08-28 MED ORDER — NYSTATIN 100000 UNIT/GM EX POWD
Freq: Two times a day (BID) | CUTANEOUS | Status: DC
  Administered 2012-08-28: 12:00:00 via TOPICAL
  Filled 2012-08-28: qty 15

## 2012-08-28 MED ORDER — ONDANSETRON HCL 4 MG/2ML IJ SOLN: 4.0000 mg | Freq: Four times a day (QID) | INTRAMUSCULAR | Status: DC | PRN

## 2012-08-28 MED ORDER — ENOXAPARIN SODIUM 40 MG/0.4ML ~~LOC~~ SOLN
40.0000 mg | SUBCUTANEOUS | Status: DC
  Administered 2012-08-28: 40 mg via SUBCUTANEOUS
  Filled 2012-08-28: qty 0.4

## 2012-08-28 MED ORDER — BISACODYL 10 MG RE SUPP: 10.0000 mg | Freq: Every day | RECTAL | Status: DC | PRN

## 2012-08-28 MED ORDER — LITHIUM CARBONATE 300 MG PO CAPS
300.0000 mg | ORAL_CAPSULE | Freq: Two times a day (BID) | ORAL | Status: DC
Start: 2012-08-28 — End: 2012-08-28
  Administered 2012-08-28: 300 mg via ORAL
  Filled 2012-08-28 (×3): qty 1

## 2012-08-28 NOTE — ED Notes (Signed)
Attempted to call report to ICU.  RN states "nothing has come over my pager notifying me we are getting a patient."  We will check with bed control.

## 2012-08-28 NOTE — Progress Notes (Signed)
Patient being discharged home. Reviewed with patient discharge instructions. Patient verbalized understanding of all discharge instructions. Patient alert, oriented and in stable condition at the time of discharge. Patient discharged home.

## 2012-08-28 NOTE — ED Notes (Signed)
Spoke w/patient's sister, Misty Stanley over the phone.  She states patient is confused at baseline and difficult to handle.  Updated her on current condition and treatments.  She will call back to check on him during day, and I assured her I would call her with admission information.

## 2012-08-28 NOTE — ED Provider Notes (Signed)
History    This chart was scribed for Glynn Octave, MD by Leone Payor, ED Scribe. This patient was seen in room [facility]06/[facility]06 and the patient's care was started 7:37 AM.   CSN: [id number]  Arrival date & time 08/28/12  0732   None     Chief Complaint  Patient presents with  . Loss of Consciousness     The history is provided by the patient. No language interpreter was used.    HPI Comments: Sean Warren is a 57 y.o. male brought in by ambulance, who presents to the Emergency Department complaining of a new episode of LOC that occurred PTA. Pt reports going to the bathroom when he felt lightheaded while urinating. States he was standing one minute and then fell down without any indication that he was about fall.  He does not recall if he hit his head but had some mild forehead pain. He has some mild neck pain that he states having at baseline. He has been having increased SOB recently and a mild cough. Reports being on O2 at home for the past 2 months. He has h/o of flutters and takes coumadin regularly. He denies having a h/o LOC. Pt walks with cane at home. He denies abdominal pain or back pain. Pt has h/o HTN, DM, HLD, atrial fibrillation, CHF. Pt is a current everyday smoker but denies alcohol use.   Past Medical History  Diagnosis Date  . Hypertension   . Diabetes mellitus   . Bipolar 1 disorder   . Hyperlipidemia   . Atrial fibrillation   . COPD (chronic obstructive pulmonary disease)   . Obstructive sleep apnea   . Chronic diastolic heart failure     Echocardiogram 05/14/11: Mild LVH, EF 50-55%, no wall motion abnormalities, grade 2 diastolic dysfunction.  . Chest pain     Myoview 05/17/11 demonstrated an EF of 61%, small fixed inferoseptal defect consistent with thinning vs prior infarct; no ischemia. - low risk  . Diabetic neuropathy   . Chronic back pain   . History of stroke   . Mental disorder     bipolar  . CHF (congestive heart failure)   . Stroke   .  Neuromuscular disorder     diabetic neuropathy  . Chronic respiratory failure with hypoxia 08/08/2012    Past Surgical History  Procedure Laterality Date  . No past surgeries      History reviewed. No pertinent family history.  History  Substance Use Topics  . Smoking status: Current Every Day Smoker -- 1.50 packs/day for 29 years    Types: Cigarettes  . Smokeless tobacco: Never Used  . Alcohol Use: No      Review of Systems A complete 10 system review of systems was obtained and all systems are negative except as noted in the HPI and PMH.   Allergies  Flexeril and Muscle relief  Home Medications   No current outpatient prescriptions on file.  BP 120/103  Pulse 99  Temp(Src) 99.4 F (37.4 C) (Oral)  Resp 16  Ht 5\' 8"  (1.727 m)  Wt 260 lb 5.8 oz (118.1 kg)  BMI 39.6 kg/m2  SpO2 94%  Physical Exam  Nursing note and vitals reviewed. Constitutional: He is oriented to person, place, and time. He appears well-developed and well-nourished. No distress.  Appears older than states age.   HENT:  Head: Normocephalic and atraumatic.  Eyes: EOM are normal.  Neck: Neck supple. No tracheal deviation present.  Cardiovascular: Normal rate, regular rhythm,  normal heart sounds and intact distal pulses.   Intact femoral and DP pulses.   Pulmonary/Chest: Effort normal. No respiratory distress. He has decreased breath sounds.  Abdominal: Soft. There is no tenderness.  Obese but non tender.   Musculoskeletal: Normal range of motion. He exhibits edema.  Trace pedal edema  Neurological: He is alert and oriented to person, place, and time.  CN 2-12 intact, no ataxia on finger to nose, no nystagmus, 5/5 strength throughout, no pronator drift, Romberg negative, normal gait.   Skin: Skin is warm and dry.  Intertrigo to the L groin.   Psychiatric: He has a normal mood and affect. His behavior is normal.    ED Course  Procedures (including critical care time)  DIAGNOSTIC  STUDIES: Oxygen Saturation is 97% on room air, adequate by my interpretation.    COORDINATION OF CARE: 4:49 PM Discussed treatment plan with pt at bedside and pt agreed to plan.   Labs Reviewed  CBC WITH DIFFERENTIAL - Abnormal; Notable for the following:    WBC 10.9 (*)    RBC 3.94 (*)    Hemoglobin 12.8 (*)    MCV 100.5 (*)    Neutrophils Relative % 78 (*)    Neutro Abs 8.5 (*)    All other components within normal limits  COMPREHENSIVE METABOLIC PANEL - Abnormal; Notable for the following:    CO2 42 (*)    Glucose, Bld 164 (*)    Albumin 3.3 (*)    GFR calc non Af Amer 72 (*)    GFR calc Af Amer 84 (*)    All other components within normal limits  PRO B NATRIURETIC PEPTIDE - Abnormal; Notable for the following:    Pro B Natriuretic peptide (BNP) 1356.0 (*)    All other components within normal limits  LITHIUM LEVEL - Abnormal; Notable for the following:    Lithium Lvl 0.49 (*)    All other components within normal limits  LIPID PANEL - Abnormal; Notable for the following:    Triglycerides 162 (*)    All other components within normal limits  GLUCOSE, CAPILLARY - Abnormal; Notable for the following:    Glucose-Capillary 122 (*)    All other components within normal limits  PROTIME-INR  TROPONIN I  TROPONIN I  TSH   Dg Chest 2 View  08/28/2012   *RADIOLOGY REPORT*  Clinical Data: Chest pain, fell this morning, dizziness  CHEST - 2 VIEW  Comparison: Portable chest x-ray of 08/09/2011  Findings: Moderate cardiomegaly is stable.  The lungs are hyperaerated consistent with emphysema but no focal infiltrate or effusion is seen.  A nodular opacity on the lateral view probably represents overlapping vascularity, but attention to this area on follow-up chest x-ray is recommended.  No acute bony abnormality is seen.  IMPRESSION:  1.  Stable cardiomegaly. 2.  Hyperaeration consistent with emphysema. 3.  Nodular opacity on the lateral view most likely vascular in origin.  Attention to  this area on follow-up chest x-ray is recommended.   Original Report Authenticated By: Dwyane Dee, M.D.     1. Syncope   2. Atrial flutter with rapid ventricular response   3. Bipolar disorder   4. Obstructive sleep apnea   5. COPD (chronic obstructive pulmonary disease)   6. Tobacco abuse   7. CVA (cerebral infarction)   8. DM (diabetes mellitus)   9. HTN (hypertension)   10. Dyslipidemia   11. Atrial flutter   12. Chronic respiratory failure with hypoxia  13. Chronic diastolic heart failure   14. Rash of groin   15. Campath-induced atrial fibrillation   16. Hypokalemia   17. Community acquired pneumonia   18. Obesity (BMI 30.0-34.9)   19. Bronchitis   20. COPD with acute exacerbation   21. Paroxysmal a-fib   22. Tachycardia       MDM  Syncopal episode today while standing in the bathroom. Denies chest pain or shortness of breath. Felt a little dizzy before passing out. History of atrial flutter on Coumadin. RVR on arrival.  EKG shows atrial flutter without acute ST changes. Troponin is negative. BNP is 1300. Patient refusing head CT due to anxiety and inability to lay flat.  He is given IV Cardizem 5 mg push and drip for RVR. Denies any chest pain or shortness of breath.  Suspect vasovagal etiology of syncope, but patient does have significant cardiac history. He will need serial enzymes and control of his rapid A flutter.  Coumadin subtherapeutic.  D/w Dr. Karilyn Cota.   Date: 08/28/2012  Rate: 111  Rhythm: atrial flutter  QRS Axis: normal  Intervals: normal  ST/T Wave abnormalities: nonspecific ST/T changes  Conduction Disutrbances:none  Narrative Interpretation:   Old EKG Reviewed: unchanged  CRITICAL CARE Performed by: Glynn Octave Total critical care time: 30 Critical care time was exclusive of separately billable procedures and treating other patients. Critical care was necessary to treat or prevent imminent or life-threatening deterioration. Critical  care was time spent personally by me on the following activities: development of treatment plan with patient and/or surrogate as well as nursing, discussions with consultants, evaluation of patient's response to treatment, examination of patient, obtaining history from patient or surrogate, ordering and performing treatments and interventions, ordering and review of laboratory studies, ordering and review of radiographic studies, pulse oximetry and re-evaluation of patient's condition.       I personally performed the services described in this documentation, which was scribed in my presence. The recorded information has been reviewed and is accurate.      Glynn Octave, MD 08/28/12 1650

## 2012-08-28 NOTE — Progress Notes (Signed)
ANTICOAGULATION CONSULT NOTE - Initial Consult  Pharmacy Consult for Coumadin (chronic PTA) Indication: atrial fibrillation  Allergies  Allergen Reactions  . Flexeril (Cyclobenzaprine) Other (See Comments)    Alters Mental Status-ALL MUSCLE RELAXERS  . Muscle Relief (Capsaicin)     Hysteria    Patient Measurements: Height: 5\' 8"  (172.7 cm) Weight: 260 lb 5.8 oz (118.1 kg) IBW/kg (Calculated) : 68.4  Vital Signs: Temp: 98.1 F (36.7 C) (06/12 1026) Temp src: Oral (06/12 1026) BP: 119/65 mmHg (06/12 1019) Pulse Rate: 102 (06/12 1019)  Labs:  Recent Labs  08/28/12 0755  HGB 12.8*  HCT 39.6  PLT 251  LABPROT 14.9  INR 1.19  CREATININE 1.10  TROPONINI <0.30   Estimated Creatinine Clearance: 91.4 ml/min (by C-G formula based on Cr of 1.1).  Medical History: Past Medical History  Diagnosis Date  . Hypertension   . Diabetes mellitus   . Bipolar 1 disorder   . Hyperlipidemia   . Atrial fibrillation   . COPD (chronic obstructive pulmonary disease)   . Obstructive sleep apnea   . Chronic diastolic heart failure     Echocardiogram 05/14/11: Mild LVH, EF 50-55%, no wall motion abnormalities, grade 2 diastolic dysfunction.  . Chest pain     Myoview 05/17/11 demonstrated an EF of 61%, small fixed inferoseptal defect consistent with thinning vs prior infarct; no ischemia. - low risk  . Diabetic neuropathy   . Chronic back pain   . History of stroke   . Mental disorder     bipolar  . CHF (congestive heart failure)   . Stroke   . Neuromuscular disorder     diabetic neuropathy  . Chronic respiratory failure with hypoxia 08/08/2012   Medications:  Prescriptions prior to admission  Medication Sig Dispense Refill  . aspirin EC 81 MG tablet Take 81 mg by mouth daily.      . clonazePAM (KLONOPIN) 0.5 MG tablet Take 0.5 mg by mouth 2 (two) times daily.      . cloNIDine (CATAPRES) 0.2 MG tablet Take 1 tablet (0.2 mg total) by mouth at bedtime.      Marland Kitchen diltiazem (CARDIZEM SR)  120 MG 12 hr capsule Take 1 capsule (120 mg total) by mouth every 12 (twelve) hours. NEW MEDICATION TO CONTROL YOUR HEART RATE.  60 capsule  2  . furosemide (LASIX) 40 MG tablet Take 1 tablet (40 mg total) by mouth 2 (two) times daily.  60 tablet  6  . gabapentin (NEURONTIN) 600 MG tablet Take 600 mg by mouth 3 (three) times daily.      Marland Kitchen glimepiride (AMARYL) 2 MG tablet Take 2 mg by mouth daily before breakfast.      . HYDROcodone-acetaminophen (NORCO) 10-325 MG per tablet Take 1 tablet by mouth every 4 (four) hours as needed. For pain      . lisinopril (PRINIVIL,ZESTRIL) 40 MG tablet Take 0.5 tablets (20 mg total) by mouth daily.      Marland Kitchen lithium carbonate (LITHOBID) 300 MG CR tablet Take 300 mg by mouth 2 (two) times daily.      . nitroGLYCERIN (NITROSTAT) 0.4 MG SL tablet Place 0.4 mg under the tongue every 5 (five) minutes as needed. For chest pain      . omeprazole (PRILOSEC) 20 MG capsule Take 20 mg by mouth daily.      . potassium chloride (KLOR-CON M10) 10 MEQ tablet Take 3 tablets (30 mEq total) by mouth daily.  90 tablet  2  . propranolol ER (INDERAL  LA) 60 MG 24 hr capsule Take 60 mg by mouth daily.      . QUEtiapine (SEROQUEL) 300 MG tablet Take 150 mg by mouth at bedtime.      . simvastatin (ZOCOR) 80 MG tablet Take 80 mg by mouth at bedtime.      Marland Kitchen warfarin (COUMADIN) 5 MG tablet Take 2.5-5 mg by mouth daily. Take 5mg  on day 1, 2.5mg  on day 2, 5mg  on day 3, and continue accordingly      . Aclidinium Bromide (TUDORZA PRESSAIR) 400 MCG/ACT AEPB Inhale 1 puff into the lungs daily.      Marland Kitchen albuterol (PROVENTIL HFA;VENTOLIN HFA) 108 (90 BASE) MCG/ACT inhaler Inhale 2 puffs into the lungs every 6 (six) hours as needed. For shortness of breath      . albuterol (PROVENTIL) (2.5 MG/3ML) 0.083% nebulizer solution Take 2.5 mg by nebulization 3 (three) times daily as needed. For shortness of breath      . budesonide-formoterol (SYMBICORT) 160-4.5 MCG/ACT inhaler Inhale 2 puffs into the lungs 2 (two)  times daily.       Assessment: 57yo male on chronic PTA for h/o afib.  INR is sub-therapeutic on admission (at baseline).  Home dose is listed above.  Reportedly took his last dose on 6/11.  ? Compliance or need for dose increase to maintain therapeutic INR.  Goal of Therapy:  INR 2-3 Monitor platelets by anticoagulation protocol: Yes   Plan:  Coumadin 7.5mg  today x 1 to boost INR INR daily  Margo Aye, Gustave Lindeman A 08/28/2012,11:10 AM

## 2012-08-28 NOTE — ED Notes (Signed)
Pt went to bathroom and "blacked out" then fell. Pt c/o pain to L arm and shoulder. Unknown if hit head. C/o neck pain  "a little". Alert/oreinted. Pt arrived in atrial flutter. cbg 159. Denies weakness/dizziness at present. r foot pain also.

## 2012-08-28 NOTE — ED Notes (Signed)
Report called to Jessica, RN in ICU.

## 2012-08-28 NOTE — ED Notes (Signed)
Patient states he cannot tolerate CT scan as he is claustrophobic.

## 2012-08-28 NOTE — ED Notes (Signed)
CRITICAL VALUE ALERT  Critical value received: CO2  42 Date of notification: 08/28/12 Time of notification:  lrt  Critical value read back:yes  Nurse who received alert:  lrt  MD notified (1st page):  Rancour Time of first page:  [phone]  MD notified (2nd page):  Time of second page:  Responding MD: Rancour  Time MD responded:  [phone]

## 2012-08-28 NOTE — H&P (Signed)
[facility] History and Physical  Sean Warren WUJ:[id number] DOB: Dec 24, 1955 DOA: 08/28/2012  Referring physician:  PCP: [name], MD  Specialists:   Chief Complaint: syncope  HPI: Sean Warren is a 57 y.o. male past medical history significant for severe oxygen-dependent COPD, chronic hypoxic respiratory failure, proximal atrial fibrillation, chronic diastolic heart failure, and diabetes who presents to the emergency department via EMS from home with the chief complaint of syncope. Information is obtained from the patient. He reports that he awakened this morning and while attempting to void he "passed out". He denies loss of consciousness. He doesn't knowledge that he hit his head. He acknowledges that he was straining to void. He states that he got up himself and his roommate called EMS. He denies any chest pain palpitation headache dizziness visual disturbances. He denies orthopnea or worsening lower extremity edema. He denies any recent illness chills fever abdominal pain nausea vomiting diarrhea. He does state that he has had decreased appetite of late. He states that he is compliant with his medications. He states that he saw his PCP last week and no medication changes were made. He also indicates that he is due for a pulmonary function test and [facility] but his heart rate has caused this test to be postponed. His EKG in the emergency room revealed atrial flutter and a heart rate of 120 beats per minute. Cardizem drip was started in the ED. On my exam his heart rate was 100. His temp is 99.7 his blood pressure 113/67, proBNP 1356 chest x-ray with stable cardiomegaly hyperaeration consistent with emphysema and nodular opacity on the lateral view most likely vascular in origin. His initial troponin less than 0.30 and lithium level 0.49 and INR 1.19   Review of Systems: The patient denies  fever, weight loss,, vision loss, decreased hearing, hoarseness, chest pain,  dyspnea on  exertion, peripheral edema, balance deficits, hemoptysis, abdominal pain, melena, hematochezia, severe indigestion/heartburn, hematuria, incontinence, genital sores, muscle weakness, suspicious skin lesions, transient blindness, difficulty walking, depression, unusual weight change, abnormal bleeding, enlarged lymph nodes, angioedema, and breast masses.    Past Medical History  Diagnosis Date  . Hypertension   . Diabetes mellitus   . Bipolar 1 disorder   . Hyperlipidemia   . Atrial fibrillation   . COPD (chronic obstructive pulmonary disease)   . Obstructive sleep apnea   . Chronic diastolic heart failure     Echocardiogram 05/14/11: Mild LVH, EF 50-55%, no wall motion abnormalities, grade 2 diastolic dysfunction.  . Chest pain     Myoview 05/17/11 demonstrated an EF of 61%, small fixed inferoseptal defect consistent with thinning vs prior infarct; no ischemia. - low risk  . Diabetic neuropathy   . Chronic back pain   . History of stroke   . Mental disorder     bipolar  . CHF (congestive heart failure)   . Stroke   . Neuromuscular disorder     diabetic neuropathy  . Chronic respiratory failure with hypoxia 08/08/2012   Past Surgical History  Procedure Laterality Date  . No past surgeries     Social History:  reports that he has been smoking Cigarettes.  He has a 43.5 pack-year smoking history. He has never used smokeless tobacco. He reports that he does not drink alcohol or use illicit drugs. Patient lives with a roommate. He has 2 grown daughters one of which is about to graduate from [employer] Allergies  Allergen Reactions  . Flexeril (Cyclobenzaprine) Other (See Comments)  Alters Mental Status-ALL MUSCLE RELAXERS  . Muscle Relief (Capsaicin)     Hysteria     History reviewed. No pertinent family history. family medical history is positive for heart disease and hypertension  Prior to Admission medications   Medication Sig Start Date End Date Taking? Authorizing Provider   aspirin EC 81 MG tablet Take 81 mg by mouth daily.   Yes Historical Provider, MD  clonazePAM (KLONOPIN) 0.5 MG tablet Take 0.5 mg by mouth 2 (two) times daily.   Yes Historical Provider, MD  cloNIDine (CATAPRES) 0.2 MG tablet Take 1 tablet (0.2 mg total) by mouth at bedtime. 08/09/12  Yes Elliot Cousin, MD  diltiazem (CARDIZEM SR) 120 MG 12 hr capsule Take 1 capsule (120 mg total) by mouth every 12 (twelve) hours. NEW MEDICATION TO CONTROL YOUR HEART RATE. 08/09/12  Yes Elliot Cousin, MD  furosemide (LASIX) 40 MG tablet Take 1 tablet (40 mg total) by mouth 2 (two) times daily. 05/08/12  Yes Wendall Stade, MD  gabapentin (NEURONTIN) 600 MG tablet Take 600 mg by mouth 3 (three) times daily.   Yes Historical Provider, MD  glimepiride (AMARYL) 2 MG tablet Take 2 mg by mouth daily before breakfast.   Yes Historical Provider, MD  HYDROcodone-acetaminophen (NORCO) 10-325 MG per tablet Take 1 tablet by mouth every 4 (four) hours as needed. For pain   Yes Historical Provider, MD  lisinopril (PRINIVIL,ZESTRIL) 40 MG tablet Take 0.5 tablets (20 mg total) by mouth daily. 08/09/12  Yes Elliot Cousin, MD  lithium carbonate (LITHOBID) 300 MG CR tablet Take 300 mg by mouth 2 (two) times daily.   Yes Historical Provider, MD  nitroGLYCERIN (NITROSTAT) 0.4 MG SL tablet Place 0.4 mg under the tongue every 5 (five) minutes as needed. For chest pain   Yes Historical Provider, MD  omeprazole (PRILOSEC) 20 MG capsule Take 20 mg by mouth daily.   Yes Historical Provider, MD  potassium chloride (KLOR-CON M10) 10 MEQ tablet Take 3 tablets (30 mEq total) by mouth daily. 08/09/12  Yes Elliot Cousin, MD  propranolol ER (INDERAL LA) 60 MG 24 hr capsule Take 60 mg by mouth daily.   Yes Historical Provider, MD  QUEtiapine (SEROQUEL) 300 MG tablet Take 150 mg by mouth at bedtime.   Yes Historical Provider, MD  simvastatin (ZOCOR) 80 MG tablet Take 80 mg by mouth at bedtime.   Yes Historical Provider, MD  warfarin (COUMADIN) 5 MG tablet  Take 2.5-5 mg by mouth daily. Take 5mg  on day 1, 2.5mg  on day 2, 5mg  on day 3, and continue accordingly   Yes Historical Provider, MD  Aclidinium Bromide (TUDORZA PRESSAIR) 400 MCG/ACT AEPB Inhale 1 puff into the lungs daily.    Historical Provider, MD  albuterol (PROVENTIL HFA;VENTOLIN HFA) 108 (90 BASE) MCG/ACT inhaler Inhale 2 puffs into the lungs every 6 (six) hours as needed. For shortness of breath    Historical Provider, MD  albuterol (PROVENTIL) (2.5 MG/3ML) 0.083% nebulizer solution Take 2.5 mg by nebulization 3 (three) times daily as needed. For shortness of breath    Historical Provider, MD  budesonide-formoterol (SYMBICORT) 160-4.5 MCG/ACT inhaler Inhale 2 puffs into the lungs 2 (two) times daily.    Historical Provider, MD   Physical Exam: Filed Vitals:   08/28/12 0805 08/28/12 0856 08/28/12 0900 08/28/12 0911  BP: 94/61 97/61 113/72 113/72  Pulse: 104  145 109  Temp:      TempSrc:      Resp: 20  29 24   SpO2: 96%  90% 95%     General:  Obese no acute distress  Eyes: PERRL, EOMI, no scleral icterus  ENT: Ears clear nose without drainage oropharynx without erythema or exudate somewhat dry but pink  Neck: Supple no JVD no lymphadenopathy  Cardiovascular: Tachycardic no murmur gallop rub trace lower extremity edema left greater than right  Respiratory: Mild increased work of breathing with conversation breath sounds very diminished no wheeze no rhonchi  Abdomen: Obese soft positive bowel sounds nontender to palpation no mass organomegaly noted  Skin: Warm very dry rash around for head mouth and nose consistent with eczema/psoriasis right groin severe erythema   Musculoskeletal: No clubbing no cyanosis   Psychiatric: Calm but slightly irritable and somewhat flat but cooperative   Neurologic: Cranial nerves II through XII grossly intact speech is soft   Labs on Admission:  Basic Metabolic Panel:  Recent Labs Lab 08/28/12 0755  NA 143  K 4.6  CL 98  CO2 42*   GLUCOSE 164*  BUN 15  CREATININE 1.10  CALCIUM 8.7   Liver Function Tests:  Recent Labs Lab 08/28/12 0755  AST 10  ALT 6  ALKPHOS 117  BILITOT 0.3  PROT 6.8  ALBUMIN 3.3*   No results found for this basename: LIPASE, AMYLASE,  in the last 168 hours No results found for this basename: AMMONIA,  in the last 168 hours CBC:  Recent Labs Lab 08/28/12 0755  WBC 10.9*  NEUTROABS 8.5*  HGB 12.8*  HCT 39.6  MCV 100.5*  PLT 251   Cardiac Enzymes:  Recent Labs Lab 08/28/12 0755  TROPONINI <0.30    BNP (last 3 results)  Recent Labs  01/02/12 2219 08/08/12 1520 08/28/12 0755  PROBNP 1881.0* 1061.0* 1356.0*   CBG: No results found for this basename: GLUCAP,  in the last 168 hours  Radiological Exams on Admission: Dg Chest 2 View  08/28/2012   *RADIOLOGY REPORT*  Clinical Data: Chest pain, fell this morning, dizziness  CHEST - 2 VIEW  Comparison: Portable chest x-ray of 08/09/2011  Findings: Moderate cardiomegaly is stable.  The lungs are hyperaerated consistent with emphysema but no focal infiltrate or effusion is seen.  A nodular opacity on the lateral view probably represents overlapping vascularity, but attention to this area on follow-up chest x-ray is recommended.  No acute bony abnormality is seen.  IMPRESSION:  1.  Stable cardiomegaly. 2.  Hyperaeration consistent with emphysema. 3.  Nodular opacity on the lateral view most likely vascular in origin.  Attention to this area on follow-up chest x-ray is recommended.   Original Report Authenticated By: Dwyane Dee, M.D.    EKG: Independently reviewed. A flutter 3-1 AV conduction   Assessment/Plan Principal Problem:   Syncope: Etiology uncertain  But likely related 2 vasovagal response, however given his cardiac history cannot rule out arrhythmia and or orthostasis. Will admit to step down. Will cycle cardiac enzymes. Will check his TSH. Will check orthostatics. Will very gently hydrate with IV fluids.  Active  Problems: Atrial flutter: With rapid ventricular response. Patient started on Cardizem drip in the emergency department and this will be continued on admission. Once his heart rate is less than 100 will transition to by mouth. Will continue home Inderal as well. Monitor close    Chronic respiratory failure: Patient is oxygen dependent. Appears to be at baseline. Will continue his home inhalers and nebulizer treatments.    Chronic diastolic heart failure: Appears compensated at this time. Will continue his home Lasix. Continue Inderal. Will  monitor daily weights and strict intake and output.    Rash of groin: Related to his morbid obesity. Will apply nystatin powder.    Bipolar disorder: Appears at baseline. Lithium level is low. Will continue home dose for now.    Obstructive sleep apnea: Will request respiratory therapy CPAP machine    COPD (chronic obstructive pulmonary disease): See #2. Will continue nebulizer treatments and inhalers    Tobacco abuse: Patient counseled regarding smoking cessation    CVA (cerebral infarction): Stable at baseline. Patient ambulates with a cane will request PT eval for an update on functional status    DM (diabetes mellitus): Recent A1c 6.5. Will continue Amaryl for now use sliding scale for optimal glucose control. Provide car modified diet    HTN (hypertension): Controlled will continue Catapres lisinopril Inderal   Dyslipidemia       Code Status: Full  Family Communication: None available Disposition Plan: Home when ready hopefully in 24-36   Time spent: 65 minutes  [facility] Pager [phone]  If 7PM-7AM, please contact night-coverage [email or web address] Password [facility] 08/28/2012, 10:04 AM  Attending: Patient seen and examined. Agree with admission.

## 2012-08-28 NOTE — ED Notes (Signed)
CT called to say patient stating he cannot lay on his back for study.  BP checked - 97/61.  Dr. Manus Gunning notified.  Order to d/c CT scan.

## 2012-08-28 NOTE — ED Notes (Signed)
Placed yellow fall risk armband and bed alarm on stretcher under patient.  He has threatened to get up to find something to drink when told he could not have additional fluids d/t fluid overload.

## 2012-08-28 NOTE — Discharge Summary (Signed)
Physician Discharge Summary  TRENT GABLER MWN:[id number] DOB: 12-13-1955 DOA: 08/28/2012  PCP: [name], MD  Admit date: 08/28/2012 Discharge date: 08/28/2012  Time spent: Greater than 30 minutes  Recommendations for Outpatient Follow-up:  1. Follow with primary care physician and cardiology.   Discharge Diagnoses:  1. Micturition syncope. Cardiac enzymes negative. 2. Atrial flutter on chronic anticoagulation. 3. Chronic respiratory failure secondary to COPD, obstructive sleep apnea. 4. Type 2 diabetes mellitus. 5. Hypertension. 6. Bipolar disorder. 7. Morbid obesity.   Discharge Condition: Stable.  Diet recommendation: Carbohydrate modified diet.  Filed Weights   08/28/12 1019  Weight: 118.1 kg (260 lb 5.8 oz)    History of present illness:  This 57 year old man presents to the hospital with symptoms of syncope. Please see initial history as outlined below: HPI: Sean Warren is a 57 y.o. male past medical history significant for severe oxygen-dependent COPD, chronic hypoxic respiratory failure, proximal atrial fibrillation, chronic diastolic heart failure, and diabetes who presents to the emergency department via EMS from home with the chief complaint of syncope. Information is obtained from the patient. He reports that he awakened this morning and while attempting to void he "passed out". He denies loss of consciousness. He doesn't knowledge that he hit his head. He acknowledges that he was straining to void. He states that he got up himself and his roommate called EMS. He denies any chest pain palpitation headache dizziness visual disturbances. He denies orthopnea or worsening lower extremity edema. He denies any recent illness chills fever abdominal pain nausea vomiting diarrhea. He does state that he has had decreased appetite of late. He states that he is compliant with his medications. He states that he saw his PCP last week and no medication changes were made. He also  indicates that he is due for a pulmonary function test and [facility] but his heart rate has caused this test to be postponed. His EKG in the emergency room revealed atrial flutter and a heart rate of 120 beats per minute. Cardizem drip was started in the ED. On my exam his heart rate was 100. His temp is 99.7 his blood pressure 113/67, proBNP 1356 chest x-ray with stable cardiomegaly hyperaeration consistent with emphysema and nodular opacity on the lateral view most likely vascular in origin. His initial troponin less than 0.30 and lithium level 0.49 and INR 1.19  Hospital Course:  The patient was admitted and serial cardiac enzymes  were negative. The history review is one of micturition syncope. He did have atrial flutter with rapid ventricular response to Cardizem drip was started and this improved his ventricular rate significantly. Cardizem drip was switched off and his ventricular rate remained at 70-80. He has been hemodynamic stable. He wants to go home. He will followup with cardiology regarding his atrial flutter and he is on chronic anticoagulation. He is now stable for discharge.  Procedures:  None.   Consultations:  None.  Discharge Exam: Filed Vitals:   08/28/12 1100 08/28/12 1130 08/28/12 1137 08/28/12 1156  BP: 99/76   111/69  Pulse: 101   108  Temp:  99.4 F (37.4 C)    TempSrc:  Oral    Resp: 17     Height:      Weight:      SpO2: 85%  91%     General: He looks systemically well. Cardiovascular: Atrial flutter. No murmurs. No evidence clinically of heart failure. Respiratory: Lung fields with few coarse rhonchi, likely chronic. No evidence of crackles or bronchial  breathing. He is alert and orientated. There are no focal neurological signs  Discharge Instructions  Discharge Orders   Future Orders Complete By Expires     Diet - low sodium heart healthy  As directed     Increase activity slowly  As directed         Medication List    TAKE these medications        albuterol 108 (90 BASE) MCG/ACT inhaler  Commonly known as:  PROVENTIL HFA;VENTOLIN HFA  Inhale 2 puffs into the lungs every 6 (six) hours as needed. For shortness of breath     albuterol (2.5 MG/3ML) 0.083% nebulizer solution  Commonly known as:  PROVENTIL  Take 2.5 mg by nebulization 3 (three) times daily as needed. For shortness of breath     aspirin EC 81 MG tablet  Take 81 mg by mouth daily.     budesonide-formoterol 160-4.5 MCG/ACT inhaler  Commonly known as:  SYMBICORT  Inhale 2 puffs into the lungs 2 (two) times daily.     clonazePAM 0.5 MG tablet  Commonly known as:  KLONOPIN  Take 0.5 mg by mouth 2 (two) times daily.     cloNIDine 0.2 MG tablet  Commonly known as:  CATAPRES  Take 1 tablet (0.2 mg total) by mouth at bedtime.     diltiazem 120 MG 12 hr capsule  Commonly known as:  CARDIZEM SR  Take 1 capsule (120 mg total) by mouth every 12 (twelve) hours. NEW MEDICATION TO CONTROL YOUR HEART RATE.     furosemide 40 MG tablet  Commonly known as:  LASIX  Take 1 tablet (40 mg total) by mouth 2 (two) times daily.     gabapentin 600 MG tablet  Commonly known as:  NEURONTIN  Take 600 mg by mouth 3 (three) times daily.     glimepiride 2 MG tablet  Commonly known as:  AMARYL  Take 2 mg by mouth daily before breakfast.     HYDROcodone-acetaminophen 10-325 MG per tablet  Commonly known as:  NORCO  Take 1 tablet by mouth every 4 (four) hours as needed. For pain     lisinopril 40 MG tablet  Commonly known as:  PRINIVIL,ZESTRIL  Take 0.5 tablets (20 mg total) by mouth daily.     lithium carbonate 300 MG CR tablet  Commonly known as:  LITHOBID  Take 300 mg by mouth 2 (two) times daily.     nitroGLYCERIN 0.4 MG SL tablet  Commonly known as:  NITROSTAT  Place 0.4 mg under the tongue every 5 (five) minutes as needed. For chest pain     omeprazole 20 MG capsule  Commonly known as:  PRILOSEC  Take 20 mg by mouth daily.     potassium chloride 10 MEQ tablet   Commonly known as:  KLOR-CON M10  Take 3 tablets (30 mEq total) by mouth daily.     propranolol ER 60 MG 24 hr capsule  Commonly known as:  INDERAL LA  Take 60 mg by mouth daily.     QUEtiapine 300 MG tablet  Commonly known as:  SEROQUEL  Take 150 mg by mouth at bedtime.     simvastatin 80 MG tablet  Commonly known as:  ZOCOR  Take 80 mg by mouth at bedtime.     TUDORZA PRESSAIR 400 MCG/ACT Aepb  Generic drug:  Aclidinium Bromide  Inhale 1 puff into the lungs daily.     warfarin 5 MG tablet  Commonly known as:  COUMADIN  Take 2.5-5 mg by mouth daily. Take 5mg  on day 1, 2.5mg  on day 2, 5mg  on day 3, and continue accordingly       Allergies  Allergen Reactions  . Flexeril (Cyclobenzaprine) Other (See Comments)    Alters Mental Status-ALL MUSCLE RELAXERS  . Muscle Relief (Capsaicin)     Hysteria       The results of significant diagnostics from this hospitalization (including imaging, microbiology, ancillary and laboratory) are listed below for reference.    Significant Diagnostic Studies: Dg Chest 2 View  08/28/2012   *RADIOLOGY REPORT*  Clinical Data: Chest pain, fell this morning, dizziness  CHEST - 2 VIEW  Comparison: Portable chest x-ray of 08/09/2011  Findings: Moderate cardiomegaly is stable.  The lungs are hyperaerated consistent with emphysema but no focal infiltrate or effusion is seen.  A nodular opacity on the lateral view probably represents overlapping vascularity, but attention to this area on follow-up chest x-ray is recommended.  No acute bony abnormality is seen.  IMPRESSION:  1.  Stable cardiomegaly. 2.  Hyperaeration consistent with emphysema. 3.  Nodular opacity on the lateral view most likely vascular in origin.  Attention to this area on follow-up chest x-ray is recommended.   Original Report Authenticated By: Dwyane Dee, M.D.   Dg Chest Port 1 View  08/08/2012   *RADIOLOGY REPORT*  Clinical Data: Tachycardia.  Shortness of breath.  Current history of  oxygen dependent COPD.  Prior history of CHF.  PORTABLE CHEST - 1 VIEW  08/08/2012 1459 hours:  Comparison: Portable chest x-rays 01/03/2012, 06/24/2011 and two- view chest x-ray 03/03/2009.  Findings: Cardiac silhouette moderately enlarged but stable.  Lungs clear.  Bronchovascular markings normal.  Pulmonary vascularity normal.  No pneumothorax.  No pleural effusions.  IMPRESSION: Stable cardiomegaly.  No acute cardiopulmonary disease.   Original Report Authenticated By: Hulan Saas, M.D.        Labs: Basic Metabolic Panel:  Recent Labs Lab 08/28/12 0755  NA 143  K 4.6  CL 98  CO2 42*  GLUCOSE 164*  BUN 15  CREATININE 1.10  CALCIUM 8.7   Liver Function Tests:  Recent Labs Lab 08/28/12 0755  AST 10  ALT 6  ALKPHOS 117  BILITOT 0.3  PROT 6.8  ALBUMIN 3.3*     CBC:  Recent Labs Lab 08/28/12 0755  WBC 10.9*  NEUTROABS 8.5*  HGB 12.8*  HCT 39.6  MCV 100.5*  PLT 251   Cardiac Enzymes:  Recent Labs Lab 08/28/12 0755 08/28/12 1339  TROPONINI <0.30 <0.30   BNP: BNP (last 3 results)  Recent Labs  01/02/12 2219 08/08/12 1520 08/28/12 0755  PROBNP 1881.0* 1061.0* 1356.0*   CBG:  Recent Labs Lab 08/28/12 1125  GLUCAP 122*       Signed:  Caliyah Sieh C  [facility] 08/28/2012, 3:25 PM

## 2012-08-29 LAB — TSH: TSH: 0.736 u[IU]/mL (ref 0.350–4.500)

## 2012-09-11 ENCOUNTER — Encounter (HOSPITAL_COMMUNITY): Payer: Medicare Other

## 2012-09-25 ENCOUNTER — Inpatient Hospital Stay (HOSPITAL_COMMUNITY): Admission: RE | Admit: 2012-09-25 | Payer: Medicare Other | Source: Ambulatory Visit

## 2012-09-29 ENCOUNTER — Ambulatory Visit (HOSPITAL_COMMUNITY): Admission: RE | Admit: 2012-09-29 | Payer: Medicare Other | Source: Ambulatory Visit

## 2012-10-07 ENCOUNTER — Ambulatory Visit (HOSPITAL_COMMUNITY): Admission: RE | Admit: 2012-10-07 | Payer: Medicare Other | Source: Ambulatory Visit

## 2012-10-12 ENCOUNTER — Emergency Department (HOSPITAL_COMMUNITY)
Admission: EM | Admit: 2012-10-12 | Discharge: 2012-10-12 | Disposition: A | Discharge status: Home / Self Care | Payer: Medicare Other | Attending: Emergency Medicine | Admitting: Emergency Medicine

## 2012-10-12 ENCOUNTER — Emergency Department (HOSPITAL_COMMUNITY): Payer: Medicare Other

## 2012-10-12 DIAGNOSIS — Z7982 Long term (current) use of aspirin: Secondary | ICD-10-CM | POA: Insufficient documentation

## 2012-10-12 DIAGNOSIS — I5032 Chronic diastolic (congestive) heart failure: Secondary | ICD-10-CM | POA: Insufficient documentation

## 2012-10-12 DIAGNOSIS — R197 Diarrhea, unspecified: Secondary | ICD-10-CM | POA: Insufficient documentation

## 2012-10-12 DIAGNOSIS — Z8709 Personal history of other diseases of the respiratory system: Secondary | ICD-10-CM | POA: Insufficient documentation

## 2012-10-12 DIAGNOSIS — R05 Cough: Secondary | ICD-10-CM | POA: Insufficient documentation

## 2012-10-12 DIAGNOSIS — Z8673 Personal history of transient ischemic attack (TIA), and cerebral infarction without residual deficits: Secondary | ICD-10-CM | POA: Insufficient documentation

## 2012-10-12 DIAGNOSIS — Z79899 Other long term (current) drug therapy: Secondary | ICD-10-CM | POA: Insufficient documentation

## 2012-10-12 DIAGNOSIS — R059 Cough, unspecified: Secondary | ICD-10-CM | POA: Insufficient documentation

## 2012-10-12 DIAGNOSIS — E1149 Type 2 diabetes mellitus with other diabetic neurological complication: Secondary | ICD-10-CM | POA: Insufficient documentation

## 2012-10-12 DIAGNOSIS — I4891 Unspecified atrial fibrillation: Secondary | ICD-10-CM | POA: Insufficient documentation

## 2012-10-12 DIAGNOSIS — R112 Nausea with vomiting, unspecified: Secondary | ICD-10-CM | POA: Insufficient documentation

## 2012-10-12 DIAGNOSIS — I10 Essential (primary) hypertension: Secondary | ICD-10-CM | POA: Insufficient documentation

## 2012-10-12 DIAGNOSIS — Z8669 Personal history of other diseases of the nervous system and sense organs: Secondary | ICD-10-CM | POA: Insufficient documentation

## 2012-10-12 DIAGNOSIS — R509 Fever, unspecified: Secondary | ICD-10-CM | POA: Insufficient documentation

## 2012-10-12 DIAGNOSIS — Z8679 Personal history of other diseases of the circulatory system: Secondary | ICD-10-CM | POA: Insufficient documentation

## 2012-10-12 DIAGNOSIS — F489 Nonpsychotic mental disorder, unspecified: Secondary | ICD-10-CM | POA: Insufficient documentation

## 2012-10-12 DIAGNOSIS — F17200 Nicotine dependence, unspecified, uncomplicated: Secondary | ICD-10-CM | POA: Insufficient documentation

## 2012-10-12 DIAGNOSIS — E785 Hyperlipidemia, unspecified: Secondary | ICD-10-CM | POA: Insufficient documentation

## 2012-10-12 DIAGNOSIS — R109 Unspecified abdominal pain: Secondary | ICD-10-CM | POA: Insufficient documentation

## 2012-10-12 DIAGNOSIS — J441 Chronic obstructive pulmonary disease with (acute) exacerbation: Secondary | ICD-10-CM

## 2012-10-12 DIAGNOSIS — F319 Bipolar disorder, unspecified: Secondary | ICD-10-CM | POA: Insufficient documentation

## 2012-10-12 DIAGNOSIS — E1142 Type 2 diabetes mellitus with diabetic polyneuropathy: Secondary | ICD-10-CM | POA: Insufficient documentation

## 2012-10-12 LAB — CBC WITH DIFFERENTIAL/PLATELET
Basophils Absolute: 0 10*3/uL (ref 0.0–0.1)
Basophils Relative: 0 % (ref 0–1)
Eosinophils Absolute: 0.2 10*3/uL (ref 0.0–0.7)
MCH: 32.6 pg (ref 26.0–34.0)
MCHC: 32.8 g/dL (ref 30.0–36.0)
Neutrophils Relative %: 70 % (ref 43–77)
Platelets: 231 10*3/uL (ref 150–400)
RBC: 3.62 MIL/uL — ABNORMAL LOW (ref 4.22–5.81)

## 2012-10-12 LAB — BASIC METABOLIC PANEL
GFR calc Af Amer: 82 mL/min — ABNORMAL LOW (ref >90)
GFR calc non Af Amer: 71 mL/min — ABNORMAL LOW (ref >90)
Potassium: 3.7 mEq/L (ref 3.5–5.1)
Sodium: 141 mEq/L (ref 135–145)

## 2012-10-12 LAB — PROTIME-INR: Prothrombin Time: 14.1 seconds (ref 11.6–15.2)

## 2012-10-12 MED ORDER — LOPERAMIDE HCL 2 MG PO CAPS
4.0000 mg | ORAL_CAPSULE | Freq: Once | ORAL | Status: AC
  Administered 2012-10-12: 4 mg via ORAL
  Filled 2012-10-12 (×2): qty 1

## 2012-10-12 MED ORDER — ONDANSETRON 8 MG PO TBDP: 8.0000 mg | ORAL_TABLET | Freq: Three times a day (TID) | ORAL | Status: DC | PRN

## 2012-10-12 MED ORDER — ALBUTEROL SULFATE (5 MG/ML) 0.5% IN NEBU
5.0000 mg | INHALATION_SOLUTION | Freq: Once | RESPIRATORY_TRACT | Status: AC
  Administered 2012-10-12: 5 mg via RESPIRATORY_TRACT
  Filled 2012-10-12: qty 1

## 2012-10-12 MED ORDER — IPRATROPIUM BROMIDE 0.02 % IN SOLN
0.5000 mg | Freq: Once | RESPIRATORY_TRACT | Status: AC
  Administered 2012-10-12: 0.5 mg via RESPIRATORY_TRACT
  Filled 2012-10-12: qty 2.5

## 2012-10-12 MED ORDER — ONDANSETRON HCL 4 MG/2ML IJ SOLN
4.0000 mg | Freq: Once | INTRAMUSCULAR | Status: AC
  Administered 2012-10-12: 4 mg via INTRAVENOUS
  Filled 2012-10-12: qty 2

## 2012-10-12 MED ORDER — SODIUM CHLORIDE 0.9 % IV BOLUS (SEPSIS)
1000.0000 mL | Freq: Once | INTRAVENOUS | Status: AC
  Administered 2012-10-12: 1000 mL via INTRAVENOUS

## 2012-10-12 NOTE — ED Notes (Signed)
Pt sitting up on side of the bed, requesting some more crackers. Pt advised advocate is looking for some.

## 2012-10-12 NOTE — ED Notes (Signed)
Pt drinking diet ginger ale and ice chips without difficulty.

## 2012-10-12 NOTE — ED Notes (Signed)
States he started having nausea, vomiting and diarrhea that started near the beginning of the week.

## 2012-10-12 NOTE — ED Provider Notes (Signed)
CSN: [id number]     Arrival date & time 10/12/12  1648 History  This chart was scribed for [name], MD by Ardelia Mems, ED Scribe. This patient was seen in room [facility] and the patient's care was started at 4:49.   First MD Initiated Contact with Patient 10/12/12 1644     Chief Complaint  Patient presents with  . Nausea  . Emesis  . Diarrhea    The history is provided by the patient. No language interpreter was used.   HPI Comments: BRUK TUMOLO is a 57 y.o. male with a history of COPD who presents to the Emergency Department complaining of watery diarrhea over the past week, with associated abdominal pain. He states that he has had about 2 episodes of watery diarrhea per day. He has had intermittent, mild periumbilical abdominal pain, which he states has mostly subsided. He also reports associated nausea and vomiting onset about 2 hours ago, with 4 episodes of emesis since. He also reports having a subjective fever. Triage Temp is 98.4 F. He denies and bleeding.   He also complains of 2 days of gradually worsening SOB that exceeds his baseline SOB. He states that this is greatly worsened by lying supine. He reports an associated mild non-productive cough and mild chest tightness that he has had for the past week. He has been prescribed cough syrup for it.  He states that his roommate is sick with bronchitis currently. He was admitted in October 2013 for SOB. He states that he has a pulmonary function test coming up on 7/31 with Dr. [name] in [place]. He is on 2 L of supplemental at home oxygen and he uses this throughout the entire day. He is a current every day smoker of 1 pack/day and been smoking about 1.5 packs/day for 29 years. He is prescribed Nitroglycerin for chest pain which he takes as needed. He has CHF and denies leg swelling. He denies wheezing, rash or any other symptoms. He states his chest feels tight but only in a small area in his left lower chest.   PCP- Dr.  Earlie Lou in [facility] Pulmonologist- Dr. [name] in [place]   Past Medical History  Diagnosis Date  . Hypertension   . Diabetes mellitus   . Bipolar 1 disorder   . Hyperlipidemia   . Atrial fibrillation   . COPD (chronic obstructive pulmonary disease)   . Obstructive sleep apnea   . Chronic diastolic heart failure     Echocardiogram 05/14/11: Mild LVH, EF 50-55%, no wall motion abnormalities, grade 2 diastolic dysfunction.  . Chest pain     Myoview 05/17/11 demonstrated an EF of 61%, small fixed inferoseptal defect consistent with thinning vs prior infarct; no ischemia. - low risk  . Diabetic neuropathy   . Chronic back pain   . History of stroke   . Mental disorder     bipolar  . CHF (congestive heart failure)   . Stroke   . Neuromuscular disorder     diabetic neuropathy  . Chronic respiratory failure with hypoxia 08/08/2012   Past Surgical History  Procedure Laterality Date  . No past surgeries     No family history on file. History  Substance Use Topics  . Smoking status: Current Every Day Smoker -- 1.50 packs/day for 29 years    Types: Cigarettes  . Smokeless tobacco: Never Used  . Alcohol Use: No  lives at home Lives with roommate On oxygen 2 lpm [place] 24/7 Smokes  1  ppd  Review of Systems  Respiratory: Positive for cough and shortness of breath.   Gastrointestinal: Positive for nausea, vomiting, abdominal pain and diarrhea.  All other systems reviewed and are negative.    Allergies  Flexeril and Muscle relief  Home Medications   Current Outpatient Rx  Name  Route  Sig  Dispense  Refill  . Aclidinium Bromide (TUDORZA PRESSAIR) 400 MCG/ACT AEPB   Inhalation   Inhale 1 puff into the lungs daily.         Marland Kitchen albuterol (PROVENTIL HFA;VENTOLIN HFA) 108 (90 BASE) MCG/ACT inhaler   Inhalation   Inhale 2 puffs into the lungs every 6 (six) hours as needed. For shortness of breath         . albuterol (PROVENTIL) (2.5 MG/3ML) 0.083% nebulizer  solution   Nebulization   Take 2.5 mg by nebulization 3 (three) times daily as needed. For shortness of breath         . aspirin EC 81 MG tablet   Oral   Take 81 mg by mouth daily.         . budesonide-formoterol (SYMBICORT) 160-4.5 MCG/ACT inhaler   Inhalation   Inhale 2 puffs into the lungs 2 (two) times daily.         . clonazePAM (KLONOPIN) 0.5 MG tablet   Oral   Take 0.5 mg by mouth 2 (two) times daily.         . cloNIDine (CATAPRES) 0.2 MG tablet   Oral   Take 1 tablet (0.2 mg total) by mouth at bedtime.         Marland Kitchen diltiazem (CARDIZEM SR) 120 MG 12 hr capsule   Oral   Take 1 capsule (120 mg total) by mouth every 12 (twelve) hours. NEW MEDICATION TO CONTROL YOUR HEART RATE.   60 capsule   2   . furosemide (LASIX) 40 MG tablet   Oral   Take 1 tablet (40 mg total) by mouth 2 (two) times daily.   60 tablet   6   . gabapentin (NEURONTIN) 600 MG tablet   Oral   Take 600 mg by mouth 3 (three) times daily.         Marland Kitchen glimepiride (AMARYL) 2 MG tablet   Oral   Take 2 mg by mouth daily before breakfast.         . HYDROcodone-acetaminophen (NORCO) 10-325 MG per tablet   Oral   Take 1 tablet by mouth every 4 (four) hours as needed. For pain         . lisinopril (PRINIVIL,ZESTRIL) 40 MG tablet   Oral   Take 0.5 tablets (20 mg total) by mouth daily.         Marland Kitchen lithium carbonate (LITHOBID) 300 MG CR tablet   Oral   Take 300 mg by mouth 2 (two) times daily.         . nitroGLYCERIN (NITROSTAT) 0.4 MG SL tablet   Sublingual   Place 0.4 mg under the tongue every 5 (five) minutes as needed. For chest pain         . omeprazole (PRILOSEC) 20 MG capsule   Oral   Take 20 mg by mouth daily.         . potassium chloride (KLOR-CON M10) 10 MEQ tablet   Oral   Take 3 tablets (30 mEq total) by mouth daily.   90 tablet   2   . propranolol ER (INDERAL LA) 60 MG 24 hr capsule   Oral  Take 60 mg by mouth daily.         . QUEtiapine (SEROQUEL) 300 MG  tablet   Oral   Take 150 mg by mouth at bedtime.         . simvastatin (ZOCOR) 80 MG tablet   Oral   Take 80 mg by mouth at bedtime.         Marland Kitchen warfarin (COUMADIN) 5 MG tablet   Oral   Take 2.5-5 mg by mouth daily. Take 5mg  on day 1, 2.5mg  on day 2, 5mg  on day 3, and continue accordingly          Triage Vitals: BP 107/76  Pulse 90  Temp(Src) 98.4 F (36.9 C) (Oral)  Resp 26  Wt 300 lb (136.079 kg)  BMI 45.63 kg/m2  SpO2 95%  Vital signs normal    Physical Exam  Nursing note and vitals reviewed. Constitutional: He is oriented to person, place, and time. He appears well-developed and well-nourished.  Non-toxic appearance. He does not appear ill. No distress.  HENT:  Head: Normocephalic and atraumatic.  Right Ear: External ear normal.  Left Ear: External ear normal.  Nose: Nose normal. No mucosal edema or rhinorrhea.  Mouth/Throat: Oropharynx is clear and moist and mucous membranes are normal. No dental abscesses or edematous.  Eyes: Conjunctivae and EOM are normal. Pupils are equal, round, and reactive to light.  Neck: Normal range of motion and full passive range of motion without pain. Neck supple.  Cardiovascular: Normal rate, regular rhythm and normal heart sounds.  Exam reveals no gallop and no friction rub.   No murmur heard. Pulmonary/Chest: Effort normal and breath sounds normal. No respiratory distress. He has no wheezes. He has no rhonchi. He has no rales. He exhibits no tenderness and no crepitus.  Breath sounds diminished. Faint wheezing in left upper posterior chest.  Abdominal: Soft. Normal appearance and bowel sounds are normal. He exhibits no distension. There is no tenderness. There is no rebound and no guarding.  Musculoskeletal: Normal range of motion. He exhibits edema. He exhibits no tenderness.  Moves all extremities well. Trace edema bilaterally in lower legs.  Neurological: He is alert and oriented to person, place, and time. He has normal  strength. No cranial nerve deficit.  Skin: Skin is warm, dry and intact. No rash noted. No erythema. No pallor.  Psychiatric: He has a normal mood and affect. His speech is normal and behavior is normal. His mood appears not anxious.    ED Course   Medications  sodium chloride 0.9 % bolus 1,000 mL (1,000 mLs Intravenous New Bag/Given 10/12/12 1722)  ondansetron (ZOFRAN) injection 4 mg (4 mg Intravenous Given 10/12/12 1742)  loperamide (IMODIUM) capsule 4 mg (4 mg Oral Given 10/12/12 1742)  albuterol (PROVENTIL) (5 MG/ML) 0.5% nebulizer solution 5 mg (5 mg Nebulization Given 10/12/12 1713)  ipratropium (ATROVENT) nebulizer solution 0.5 mg (0.5 mg Nebulization Given 10/12/12 1713)   Procedures (including critical care time)  DIAGNOSTIC STUDIES: Oxygen Saturation is 95% on nasal cannula, adequate by my interpretation.    COORDINATION OF CARE:  4:54 PM- Pt advised of plan for diagnostic lab work and radiology, along with plan to receive medications for symptomatic relief in the ED and pt agrees.  6:08 PM- Recheck with pt and pt reports some improvement with his breathing after recieving medications. Pt advised of radiology findings. Pt states that he now has a cough, productive of clear sputum. He states that he feels ready for discharge, but nursing  staff report that he was inquiring about admission.  19:30 Pt states he is feeling better, feels he can go home. Concerned about getting nausea or diarrhea again. We discussed using his nebulizer or inhaler just prior to going to bed. He has been drinking fluids and eating crackers.   Results for orders placed during the hospital encounter of 10/12/12  CBC WITH DIFFERENTIAL      Result Value Range   WBC 9.6  4.0 - 10.5 K/uL   RBC 3.62 (*) 4.22 - 5.81 MIL/uL   Hemoglobin 11.8 (*) 13.0 - 17.0 g/dL   HCT 16.1 (*) 09.6 - 04.5 %   MCV 99.4  78.0 - 100.0 fL   MCH 32.6  26.0 - 34.0 pg   MCHC 32.8  30.0 - 36.0 g/dL   RDW 40.9  81.1 - 91.4 %    Platelets 231  150 - 400 K/uL   Neutrophils Relative % 70  43 - 77 %   Neutro Abs 6.8  1.7 - 7.7 K/uL   Lymphocytes Relative 24  12 - 46 %   Lymphs Abs 2.3  0.7 - 4.0 K/uL   Monocytes Relative 4  3 - 12 %   Monocytes Absolute 0.4  0.1 - 1.0 K/uL   Eosinophils Relative 2  0 - 5 %   Eosinophils Absolute 0.2  0.0 - 0.7 K/uL   Basophils Relative 0  0 - 1 %   Basophils Absolute 0.0  0.0 - 0.1 K/uL  BASIC METABOLIC PANEL      Result Value Range   Sodium 141  135 - 145 mEq/L   Potassium 3.7  3.5 - 5.1 mEq/L   Chloride 101  96 - 112 mEq/L   CO2 35 (*) 19 - 32 mEq/L   Glucose, Bld 110 (*) 70 - 99 mg/dL   BUN 9  6 - 23 mg/dL   Creatinine, Ser 7.82  0.50 - 1.35 mg/dL   Calcium 9.3  8.4 - 95.6 mg/dL   GFR calc non Af Amer 71 (*) >90 mL/min   GFR calc Af Amer 82 (*) >90 mL/min  PROTIME-INR      Result Value Range   Prothrombin Time 14.1  11.6 - 15.2 seconds   INR 1.11  0.00 - 1.49   Laboratory interpretation all normal except improving metabolic alkalosis (compensatory for respiratory acidosis), stable mild anemia   Dg Chest 2 View  10/12/2012   *RADIOLOGY REPORT*  Clinical Data: Nonproductive cough, shortness of breath, history of COPD and CHF, evaluate for pneumonia  CHEST - 2 VIEW  Comparison: 08/28/2012; 08/08/2012; 03/03/2009  Findings:  Examination is degraded secondary to patient body habitus.  Grossly unchanged and enlarged cardiac silhouette and mediastinal contours. Evaluation of the retrosternal clear space is obscured secondary to overlying soft tissues.  The lungs again appear hyperinflated with flattening of bilateral hemidiaphragms and mild diffuse slightly nodular thickening of the interstitium. Heterogeneous slightly nodular air space opacities within the left lower lung (best appreciated on lateral radiograph), appear to have progressed since recently performed chest radiograph of 08/28/2012. No pleural effusion or pneumothorax. No definite evidence of edema. No acute osseous  abnormality.  IMPRESSION: 1.  Slightly nodular air space opacities within the left lower lung (best appreciated the lateral radiograph) may represent areas of infection though discrete underlying nodules are not excluded.  As such, a follow-up chest radiograph in 4 to 6 weeks after treatment is recommended to ensure resolution. 2.  Persistent findings of cardiomegaly and lung  hyperexpansion.   Original Report Authenticated By: Tacey Ruiz, MD     Date: 10/12/2012  Rate: 83   Rhythm: atrial fibrillation  QRS Axis: normal  Intervals: normal  ST/T Wave abnormalities: normal  Conduction Disutrbances:none  Narrative Interpretation: electrode artifact  Old EKG Reviewed: none available   1. Nausea vomiting and diarrhea   2. COPD with exacerbation    New Prescriptions   ONDANSETRON (ZOFRAN ODT) 8 MG DISINTEGRATING TABLET    Take 1 tablet (8 mg total) by mouth every 8 (eight) hours as needed for nausea.    Plan discharge   Devoria Albe, MD, FACEP   MDM  Pt has CXR suggestive of pneumonia, however pt is now coughing up clear sputum and is without fever or leukocytosis. His pulse ox has been good on his chronic oxygen at 2 lpm [place]   I personally performed the services described in this documentation, which was scribed in my presence. The recorded information has been reviewed and considered.   Devoria Albe, MD, Armando Gang    [name], MD 10/12/12 1946

## 2012-10-16 ENCOUNTER — Ambulatory Visit (HOSPITAL_COMMUNITY)
Admission: RE | Admit: 2012-10-16 | Discharge: 2012-10-16 | Disposition: A | Discharge status: Home / Self Care | Payer: Medicare Other | Source: Ambulatory Visit | Attending: Pulmonary Disease | Admitting: Pulmonary Disease

## 2012-10-16 DIAGNOSIS — J449 Chronic obstructive pulmonary disease, unspecified: Secondary | ICD-10-CM | POA: Insufficient documentation

## 2012-10-16 DIAGNOSIS — J4489 Other specified chronic obstructive pulmonary disease: Secondary | ICD-10-CM | POA: Insufficient documentation

## 2012-10-16 MED ORDER — ALBUTEROL SULFATE (5 MG/ML) 0.5% IN NEBU
2.5000 mg | INHALATION_SOLUTION | Freq: Once | RESPIRATORY_TRACT | Status: AC
  Administered 2012-10-16: 2.5 mg via RESPIRATORY_TRACT

## 2012-10-17 NOTE — Procedures (Signed)
NAME:  Sean Warren, Sean Warren NO.:  [id number]  MEDICAL RECORD NO.:  [id number]  LOCATION:  RESP                          FACILITY:  [facility]  PHYSICIAN:  Anton Cheramie L. [name], M.D.DATE OF BIRTH:  Feb 12, 1956  DATE OF PROCEDURE:  10/16/2012 DATE OF DISCHARGE:  10/16/2012                           PULMONARY FUNCTION TEST   REASON FOR PULMONARY FUNCTION TESTING:  Chronic obstructive pulmonary disease.  1. Spirometry shows a severe ventilatory defect with airflow     obstruction. 2. Lung volumes show marked air trapping. 3. DLCO is severely reduced, but does correct somewhat when     ventilation is taken into account. 4. Airway resistance is markedly elevated, confirming the presence of     airflow obstruction. 5. There is significant bronchodilator improvement. 6. This study is consistent with the clinical diagnosis of COPD.     [name] L. [name], M.D.     ELH/MEDQ  D:  10/16/2012  T:  10/17/2012  Job:  [id number]

## 2012-10-31 ENCOUNTER — Emergency Department (HOSPITAL_COMMUNITY): Payer: Medicare Other

## 2012-10-31 ENCOUNTER — Inpatient Hospital Stay (HOSPITAL_COMMUNITY)
Admission: EM | Admit: 2012-10-31 | Discharge: 2012-11-01 | DRG: 292 | Disposition: A | Discharge status: Home / Self Care | Payer: Medicare Other | Attending: Internal Medicine | Admitting: Internal Medicine

## 2012-10-31 ENCOUNTER — Observation Stay (HOSPITAL_COMMUNITY): Payer: Medicare Other

## 2012-10-31 ENCOUNTER — Inpatient Hospital Stay (HOSPITAL_COMMUNITY): Payer: Medicare Other

## 2012-10-31 DIAGNOSIS — R55 Syncope and collapse: Secondary | ICD-10-CM

## 2012-10-31 DIAGNOSIS — Z7901 Long term (current) use of anticoagulants: Secondary | ICD-10-CM

## 2012-10-31 DIAGNOSIS — J9610 Chronic respiratory failure, unspecified whether with hypoxia or hypercapnia: Secondary | ICD-10-CM | POA: Diagnosis present

## 2012-10-31 DIAGNOSIS — E119 Type 2 diabetes mellitus without complications: Secondary | ICD-10-CM

## 2012-10-31 DIAGNOSIS — R0989 Other specified symptoms and signs involving the circulatory and respiratory systems: Secondary | ICD-10-CM

## 2012-10-31 DIAGNOSIS — R000 Tachycardia, unspecified: Secondary | ICD-10-CM

## 2012-10-31 DIAGNOSIS — R21 Rash and other nonspecific skin eruption: Secondary | ICD-10-CM

## 2012-10-31 DIAGNOSIS — J441 Chronic obstructive pulmonary disease with (acute) exacerbation: Secondary | ICD-10-CM

## 2012-10-31 DIAGNOSIS — Z9119 Patient's noncompliance with other medical treatment and regimen: Secondary | ICD-10-CM

## 2012-10-31 DIAGNOSIS — J189 Pneumonia, unspecified organism: Secondary | ICD-10-CM

## 2012-10-31 DIAGNOSIS — Z6830 Body mass index (BMI) 30.0-30.9, adult: Secondary | ICD-10-CM

## 2012-10-31 DIAGNOSIS — E1149 Type 2 diabetes mellitus with other diabetic neurological complication: Secondary | ICD-10-CM | POA: Diagnosis present

## 2012-10-31 DIAGNOSIS — Z79899 Other long term (current) drug therapy: Secondary | ICD-10-CM

## 2012-10-31 DIAGNOSIS — E669 Obesity, unspecified: Secondary | ICD-10-CM

## 2012-10-31 DIAGNOSIS — R0600 Dyspnea, unspecified: Secondary | ICD-10-CM

## 2012-10-31 DIAGNOSIS — E785 Hyperlipidemia, unspecified: Secondary | ICD-10-CM

## 2012-10-31 DIAGNOSIS — F319 Bipolar disorder, unspecified: Secondary | ICD-10-CM

## 2012-10-31 DIAGNOSIS — F17200 Nicotine dependence, unspecified, uncomplicated: Secondary | ICD-10-CM | POA: Diagnosis present

## 2012-10-31 DIAGNOSIS — G4733 Obstructive sleep apnea (adult) (pediatric): Secondary | ICD-10-CM

## 2012-10-31 DIAGNOSIS — I480 Paroxysmal atrial fibrillation: Secondary | ICD-10-CM

## 2012-10-31 DIAGNOSIS — I5032 Chronic diastolic (congestive) heart failure: Secondary | ICD-10-CM

## 2012-10-31 DIAGNOSIS — I509 Heart failure, unspecified: Secondary | ICD-10-CM

## 2012-10-31 DIAGNOSIS — J449 Chronic obstructive pulmonary disease, unspecified: Secondary | ICD-10-CM

## 2012-10-31 DIAGNOSIS — Z720 Tobacco use: Secondary | ICD-10-CM

## 2012-10-31 DIAGNOSIS — Z91199 Patient's noncompliance with other medical treatment and regimen due to unspecified reason: Secondary | ICD-10-CM

## 2012-10-31 DIAGNOSIS — Z8673 Personal history of transient ischemic attack (TIA), and cerebral infarction without residual deficits: Secondary | ICD-10-CM

## 2012-10-31 DIAGNOSIS — J9611 Chronic respiratory failure with hypoxia: Secondary | ICD-10-CM

## 2012-10-31 DIAGNOSIS — I5033 Acute on chronic diastolic (congestive) heart failure: Secondary | ICD-10-CM

## 2012-10-31 DIAGNOSIS — I4891 Unspecified atrial fibrillation: Secondary | ICD-10-CM

## 2012-10-31 DIAGNOSIS — E66811 Obesity, class 1: Secondary | ICD-10-CM

## 2012-10-31 DIAGNOSIS — R0609 Other forms of dyspnea: Secondary | ICD-10-CM

## 2012-10-31 DIAGNOSIS — I4892 Unspecified atrial flutter: Secondary | ICD-10-CM

## 2012-10-31 DIAGNOSIS — I639 Cerebral infarction, unspecified: Secondary | ICD-10-CM

## 2012-10-31 DIAGNOSIS — E876 Hypokalemia: Secondary | ICD-10-CM

## 2012-10-31 DIAGNOSIS — I10 Essential (primary) hypertension: Secondary | ICD-10-CM

## 2012-10-31 DIAGNOSIS — Z9981 Dependence on supplemental oxygen: Secondary | ICD-10-CM

## 2012-10-31 DIAGNOSIS — E1142 Type 2 diabetes mellitus with diabetic polyneuropathy: Secondary | ICD-10-CM | POA: Diagnosis present

## 2012-10-31 DIAGNOSIS — J4489 Other specified chronic obstructive pulmonary disease: Secondary | ICD-10-CM | POA: Diagnosis present

## 2012-10-31 DIAGNOSIS — J40 Bronchitis, not specified as acute or chronic: Secondary | ICD-10-CM

## 2012-10-31 LAB — RAPID URINE DRUG SCREEN, HOSP PERFORMED
Amphetamines: NOT DETECTED
Barbiturates: NOT DETECTED
Tetrahydrocannabinol: NOT DETECTED

## 2012-10-31 LAB — BLOOD GAS, ARTERIAL
Delivery systems: POSITIVE
Expiratory PAP: 6
Patient temperature: 37
RATE: 10 resp/min
TCO2: 28.5 mmol/L (ref 0–100)
pH, Arterial: 7.437 (ref 7.350–7.450)

## 2012-10-31 LAB — URINALYSIS, ROUTINE W REFLEX MICROSCOPIC
Hgb urine dipstick: NEGATIVE
Leukocytes, UA: NEGATIVE
Nitrite: NEGATIVE
Protein, ur: NEGATIVE mg/dL
Urobilinogen, UA: 0.2 mg/dL (ref 0.0–1.0)

## 2012-10-31 LAB — TROPONIN I: Troponin I: <0.3 ng/mL (ref <0.30)

## 2012-10-31 LAB — CBC WITH DIFFERENTIAL/PLATELET
HCT: 35.2 % — ABNORMAL LOW (ref 39.0–52.0)
Hemoglobin: 11.2 g/dL — ABNORMAL LOW (ref 13.0–17.0)
Lymphocytes Relative: 19 % (ref 12–46)
Monocytes Absolute: 0.2 10*3/uL (ref 0.1–1.0)
Monocytes Relative: 4 % (ref 3–12)
Neutro Abs: 4.8 10*3/uL (ref 1.7–7.7)
WBC: 6.3 10*3/uL (ref 4.0–10.5)

## 2012-10-31 LAB — BASIC METABOLIC PANEL
BUN: 10 mg/dL (ref 6–23)
CO2: 35 mEq/L — ABNORMAL HIGH (ref 19–32)
Chloride: 106 mEq/L (ref 96–112)
Creatinine, Ser: 0.88 mg/dL (ref 0.50–1.35)
Glucose, Bld: 131 mg/dL — ABNORMAL HIGH (ref 70–99)

## 2012-10-31 LAB — PROTIME-INR: Prothrombin Time: 14.6 seconds (ref 11.6–15.2)

## 2012-10-31 LAB — PRO B NATRIURETIC PEPTIDE: Pro B Natriuretic peptide (BNP): 1622 pg/mL — ABNORMAL HIGH (ref 0–125)

## 2012-10-31 MED ORDER — NALOXONE HCL 0.4 MG/ML IJ SOLN
INTRAMUSCULAR | Status: AC
  Filled 2012-10-31: qty 1

## 2012-10-31 MED ORDER — CLONAZEPAM 0.5 MG PO TABS
0.5000 mg | ORAL_TABLET | Freq: Two times a day (BID) | ORAL | Status: DC
  Administered 2012-10-31 – 2012-11-01 (×2): 0.5 mg via ORAL
  Filled 2012-10-31 (×2): qty 1

## 2012-10-31 MED ORDER — IPRATROPIUM BROMIDE 0.02 % IN SOLN: 1.0000 mg | Freq: Once | RESPIRATORY_TRACT | Status: DC

## 2012-10-31 MED ORDER — ALBUTEROL SULFATE (5 MG/ML) 0.5% IN NEBU: 2.5000 mg | INHALATION_SOLUTION | RESPIRATORY_TRACT | Status: DC

## 2012-10-31 MED ORDER — INSULIN ASPART 100 UNIT/ML ~~LOC~~ SOLN
0.0000 [IU] | Freq: Three times a day (TID) | SUBCUTANEOUS | Status: DC
  Administered 2012-11-01: 3 [IU] via SUBCUTANEOUS

## 2012-10-31 MED ORDER — METHYLPREDNISOLONE SODIUM SUCC 125 MG IJ SOLR
125.0000 mg | Freq: Once | INTRAMUSCULAR | Status: AC
  Administered 2012-10-31: 125 mg via INTRAVENOUS
  Filled 2012-10-31: qty 2

## 2012-10-31 MED ORDER — QUETIAPINE FUMARATE 100 MG PO TABS
ORAL_TABLET | ORAL | Status: AC
  Filled 2012-10-31: qty 2

## 2012-10-31 MED ORDER — BUDESONIDE-FORMOTEROL FUMARATE 160-4.5 MCG/ACT IN AERO
2.0000 | INHALATION_SPRAY | Freq: Two times a day (BID) | RESPIRATORY_TRACT | Status: DC
  Administered 2012-11-01: 2 via RESPIRATORY_TRACT
  Filled 2012-10-31: qty 6

## 2012-10-31 MED ORDER — ALBUTEROL (5 MG/ML) CONTINUOUS INHALATION SOLN: 10.0000 mg/h | INHALATION_SOLUTION | RESPIRATORY_TRACT | Status: AC

## 2012-10-31 MED ORDER — ALBUTEROL SULFATE (5 MG/ML) 0.5% IN NEBU
INHALATION_SOLUTION | RESPIRATORY_TRACT | Status: AC
  Administered 2012-10-31: 10 mg
  Filled 2012-10-31: qty 2

## 2012-10-31 MED ORDER — FUROSEMIDE 10 MG/ML IJ SOLN
40.0000 mg | Freq: Once | INTRAMUSCULAR | Status: AC
  Administered 2012-10-31: 40 mg via INTRAVENOUS
  Filled 2012-10-31: qty 4

## 2012-10-31 MED ORDER — ATORVASTATIN CALCIUM 40 MG PO TABS: 40.0000 mg | ORAL_TABLET | Freq: Every day | ORAL | Status: DC

## 2012-10-31 MED ORDER — MORPHINE SULFATE 2 MG/ML IJ SOLN: 1.0000 mg | INTRAMUSCULAR | Status: DC | PRN

## 2012-10-31 MED ORDER — LEVALBUTEROL HCL 0.63 MG/3ML IN NEBU: 0.6300 mg | INHALATION_SOLUTION | RESPIRATORY_TRACT | Status: DC | PRN

## 2012-10-31 MED ORDER — IPRATROPIUM BROMIDE 0.02 % IN SOLN
RESPIRATORY_TRACT | Status: AC
  Administered 2012-10-31: 0.5 mg
  Filled 2012-10-31: qty 2.5

## 2012-10-31 MED ORDER — ETOMIDATE 2 MG/ML IV SOLN
INTRAVENOUS | Status: AC
  Filled 2012-10-31: qty 10

## 2012-10-31 MED ORDER — ACLIDINIUM BROMIDE 400 MCG/ACT IN AEPB: 1.0000 | INHALATION_SPRAY | Freq: Every day | RESPIRATORY_TRACT | Status: DC

## 2012-10-31 MED ORDER — INSULIN ASPART 100 UNIT/ML ~~LOC~~ SOLN: 0.0000 [IU] | Freq: Every day | SUBCUTANEOUS | Status: DC

## 2012-10-31 MED ORDER — IPRATROPIUM BROMIDE 0.02 % IN SOLN
0.5000 mg | RESPIRATORY_TRACT | Status: DC
  Administered 2012-11-01: 0.5 mg via RESPIRATORY_TRACT
  Filled 2012-10-31: qty 2.5

## 2012-10-31 MED ORDER — HYDROCODONE-ACETAMINOPHEN 10-325 MG PO TABS
1.0000 | ORAL_TABLET | ORAL | Status: DC | PRN
  Administered 2012-10-31 – 2012-11-01 (×2): 1 via ORAL
  Filled 2012-10-31 (×2): qty 1

## 2012-10-31 MED ORDER — ACETAMINOPHEN 650 MG RE SUPP: 650.0000 mg | Freq: Four times a day (QID) | RECTAL | Status: DC | PRN

## 2012-10-31 MED ORDER — WARFARIN SODIUM 7.5 MG PO TABS: 7.5000 mg | ORAL_TABLET | Freq: Once | ORAL | Status: DC

## 2012-10-31 MED ORDER — DILTIAZEM LOAD VIA INFUSION
10.0000 mg | Freq: Once | INTRAVENOUS | Status: AC
  Administered 2012-10-31: 10 mg via INTRAVENOUS
  Filled 2012-10-31: qty 10

## 2012-10-31 MED ORDER — SODIUM CHLORIDE 0.9 % IV SOLN
INTRAVENOUS | Status: AC
  Administered 2012-10-31: 17:00:00 via INTRAVENOUS

## 2012-10-31 MED ORDER — LIDOCAINE HCL (CARDIAC) 20 MG/ML IV SOLN
INTRAVENOUS | Status: AC
  Filled 2012-10-31: qty 5

## 2012-10-31 MED ORDER — SUCCINYLCHOLINE CHLORIDE 20 MG/ML IJ SOLN
INTRAMUSCULAR | Status: AC
  Filled 2012-10-31: qty 1

## 2012-10-31 MED ORDER — ACETAMINOPHEN 325 MG PO TABS: 650.0000 mg | ORAL_TABLET | Freq: Four times a day (QID) | ORAL | Status: DC | PRN

## 2012-10-31 MED ORDER — ONDANSETRON HCL 4 MG PO TABS: 4.0000 mg | ORAL_TABLET | Freq: Four times a day (QID) | ORAL | Status: DC | PRN

## 2012-10-31 MED ORDER — DIAZEPAM 5 MG PO TABS
10.0000 mg | ORAL_TABLET | Freq: Two times a day (BID) | ORAL | Status: DC | PRN
  Administered 2012-10-31: 10 mg via ORAL
  Filled 2012-10-31: qty 2

## 2012-10-31 MED ORDER — QUETIAPINE FUMARATE 50 MG PO TABS
150.0000 mg | ORAL_TABLET | Freq: Every day | ORAL | Status: DC
  Administered 2012-10-31: 150 mg via ORAL
  Filled 2012-10-31 (×3): qty 1

## 2012-10-31 MED ORDER — ROCURONIUM BROMIDE 50 MG/5ML IV SOLN
INTRAVENOUS | Status: AC
  Filled 2012-10-31: qty 2

## 2012-10-31 MED ORDER — NALOXONE HCL 0.4 MG/ML IJ SOLN
0.4000 mg | Freq: Once | INTRAMUSCULAR | Status: AC
  Administered 2012-10-31: 0.4 mg via INTRAVENOUS

## 2012-10-31 MED ORDER — FUROSEMIDE 10 MG/ML IJ SOLN
40.0000 mg | Freq: Two times a day (BID) | INTRAMUSCULAR | Status: DC
  Administered 2012-11-01: 40 mg via INTRAVENOUS
  Filled 2012-10-31: qty 4

## 2012-10-31 MED ORDER — WARFARIN SODIUM 2.5 MG PO TABS: 2.5000 mg | ORAL_TABLET | Freq: Every day | ORAL | Status: DC

## 2012-10-31 MED ORDER — ASPIRIN EC 81 MG PO TBEC
81.0000 mg | DELAYED_RELEASE_TABLET | Freq: Every day | ORAL | Status: DC
  Administered 2012-11-01: 81 mg via ORAL
  Filled 2012-10-31: qty 1

## 2012-10-31 MED ORDER — WARFARIN - PHARMACIST DOSING INPATIENT: Status: DC

## 2012-10-31 MED ORDER — DILTIAZEM HCL 100 MG IV SOLR
5.0000 mg/h | INTRAVENOUS | Status: DC
  Administered 2012-10-31: 5 mg/h via INTRAVENOUS
  Administered 2012-11-01: 15 mg/h via INTRAVENOUS
  Filled 2012-10-31: qty 100

## 2012-10-31 MED ORDER — ONDANSETRON HCL 4 MG/2ML IJ SOLN: 4.0000 mg | Freq: Four times a day (QID) | INTRAMUSCULAR | Status: DC | PRN

## 2012-10-31 MED ORDER — LITHIUM CARBONATE ER 300 MG PO TBCR
300.0000 mg | EXTENDED_RELEASE_TABLET | Freq: Two times a day (BID) | ORAL | Status: DC
Start: 2012-10-31 — End: 2012-11-01
  Administered 2012-10-31: 300 mg via ORAL
  Filled 2012-10-31 (×6): qty 1

## 2012-10-31 MED ORDER — PANTOPRAZOLE SODIUM 40 MG PO TBEC
40.0000 mg | DELAYED_RELEASE_TABLET | Freq: Every day | ORAL | Status: DC
  Administered 2012-11-01: 40 mg via ORAL
  Filled 2012-10-31 (×2): qty 1

## 2012-10-31 MED ORDER — ETOMIDATE 2 MG/ML IV SOLN
INTRAVENOUS | Status: AC
  Filled 2012-10-31: qty 20

## 2012-10-31 MED ORDER — SODIUM CHLORIDE 0.9 % IV SOLN
INTRAVENOUS | Status: AC
  Administered 2012-10-31: 21:00:00 via INTRAVENOUS

## 2012-10-31 MED ORDER — LEVALBUTEROL HCL 0.63 MG/3ML IN NEBU
0.6300 mg | INHALATION_SOLUTION | Freq: Four times a day (QID) | RESPIRATORY_TRACT | Status: DC
  Administered 2012-10-31 – 2012-11-01 (×2): 0.63 mg via RESPIRATORY_TRACT
  Filled 2012-10-31 (×2): qty 3

## 2012-10-31 MED ORDER — GABAPENTIN 300 MG PO CAPS
600.0000 mg | ORAL_CAPSULE | Freq: Three times a day (TID) | ORAL | Status: DC
  Administered 2012-10-31 – 2012-11-01 (×2): 600 mg via ORAL
  Filled 2012-10-31 (×2): qty 2

## 2012-10-31 NOTE — ED Notes (Signed)
Respiratory therapy paged and patient placed on BiPap

## 2012-10-31 NOTE — ED Notes (Addendum)
States that he started feeling short of breath yesterday.  EMS states O2 sats 80 on  3 liter O2 [place].  Placed on C-Pap with improvement in oxygenation to 92%.  One on one nursing care initiated.

## 2012-10-31 NOTE — ED Notes (Signed)
PO fluids provided. 

## 2012-10-31 NOTE — ED Notes (Signed)
Patient continues to yell out at er staff.he removed his leads and states he cant help it. Placed leads back on and he continues to remove them

## 2012-10-31 NOTE — ED Notes (Signed)
Pt taken to CT, upon arrival xray department, the patient states that he is not able to do his scan at this moment because he has to have a bowel movement.  The patient was brought back to the department and placed on bedpan, no bowel movement.  CT contacted and advised to return to pick up the patient for scan.

## 2012-10-31 NOTE — ED Notes (Signed)
Error in admission room assignment, [address] notified that report was called in error.

## 2012-10-31 NOTE — H&P (Signed)
[facility] History and Physical  [name]:[id number] DOB: 1955-11-25 DOA: 10/31/2012  Referring physician: Dr. Clarene Duke, Sylvester Harder PCP: [name], MD  Specialists: Cardiologist: Dr. Eden Emms  Chief Complaint: Shortness of breath  HPI: Sean Warren is a 57 y.o. male with chronic respiratory failure on 2 L of oxygen, COPD, chronic diastolic congestive heart failure. He presents to the emergency room with progressive shortness of breath. He reports onset of symptoms approximately 2-3 days ago. He is progressively getting worse. He has a bit of a cough. Denies any fever. No nausea or vomiting. Had diarrhea the past but none recently, no chest pain. He reports that he has not been compliant with his dietary salt intake. He is on Lasix twice a day and reports compliance with this. He was brought to the emergency room by EMS and was found to be very short of breath. He required BiPAP placement by EMS. He was observed in the emergency room and was found to be extremely lethargic. Initial plans were made to intubate the patient for airway protection. Once patient was prepped for intubation, respiratory therapist plan to remove his dentures. At this point the patient probably woke up and began to yell at staff. Since that time, he has been on nasal cannula. Chest x-ray shows evidence of acute CHF. He's been given Lasix with good urine output. Heart rate was found to be elevated in the 120s to 1:30 range, atrial fibrillation. Patient is referred for admission.  Review of Systems: Pertinent positives as per history of present illness, otherwise negative  Past Medical History  Diagnosis Date  . Hypertension   . Diabetes mellitus   . Bipolar 1 disorder   . Hyperlipidemia   . Atrial fibrillation   . COPD (chronic obstructive pulmonary disease)   . Obstructive sleep apnea   . Chronic diastolic heart failure     Echocardiogram 05/14/11: Mild LVH, EF 50-55%, no wall motion abnormalities,  grade 2 diastolic dysfunction.  . Chest pain     Myoview 05/17/11 demonstrated an EF of 61%, small fixed inferoseptal defect consistent with thinning vs prior infarct; no ischemia. - low risk  . Diabetic neuropathy   . Chronic back pain   . History of stroke   . Mental disorder     bipolar  . CHF (congestive heart failure)   . Stroke   . Neuromuscular disorder     diabetic neuropathy  . Chronic respiratory failure with hypoxia 08/08/2012   Past Surgical History  Procedure Laterality Date  . No past surgeries     Social History:  reports that he has been smoking Cigarettes.  He has a 43.5 pack-year smoking history. He has never used smokeless tobacco. He reports that he does not drink alcohol or use illicit drugs.   Allergies  Allergen Reactions  . Flexeril [Cyclobenzaprine] Other (See Comments)    Alters Mental Status-ALL MUSCLE RELAXERS  . Muscle Relief [Capsaicin]     Hysteria     Family history: Father died of heart failure in his 35s, mother has "heart issues"  Prior to Admission medications   Medication Sig Start Date End Date Taking? Authorizing Provider  Aclidinium Bromide (TUDORZA PRESSAIR) 400 MCG/ACT AEPB Inhale 1 puff into the lungs daily.   Yes Historical Provider, MD  albuterol (PROVENTIL HFA;VENTOLIN HFA) 108 (90 BASE) MCG/ACT inhaler Inhale 2 puffs into the lungs every 6 (six) hours as needed. For shortness of breath   Yes Historical Provider, MD  albuterol (PROVENTIL) (2.5 MG/3ML) 0.083%  nebulizer solution Take 2.5 mg by nebulization 3 (three) times daily as needed. For shortness of breath   Yes Historical Provider, MD  aspirin EC 81 MG tablet Take 81 mg by mouth daily.   Yes Historical Provider, MD  budesonide-formoterol (SYMBICORT) 160-4.5 MCG/ACT inhaler Inhale 2 puffs into the lungs 2 (two) times daily.   Yes Historical Provider, MD  clonazePAM (KLONOPIN) 0.5 MG tablet Take 0.5 mg by mouth 2 (two) times daily.   Yes Historical Provider, MD  cloNIDine  (CATAPRES) 0.2 MG tablet Take 1 tablet (0.2 mg total) by mouth at bedtime. 08/09/12  Yes Elliot Cousin, MD  diazepam (VALIUM) 10 MG tablet Take 1 tablet by mouth 2 (two) times daily as needed for anxiety or sleep.  08/18/12  Yes Historical Provider, MD  diltiazem (CARDIZEM SR) 120 MG 12 hr capsule Take 1 capsule (120 mg total) by mouth every 12 (twelve) hours. NEW MEDICATION TO CONTROL YOUR HEART RATE. 08/09/12  Yes Elliot Cousin, MD  furosemide (LASIX) 40 MG tablet Take 80 mg by mouth daily.   Yes Historical Provider, MD  gabapentin (NEURONTIN) 600 MG tablet Take 600 mg by mouth 3 (three) times daily.   Yes Historical Provider, MD  glimepiride (AMARYL) 2 MG tablet Take 2 mg by mouth daily before breakfast.   Yes Historical Provider, MD  HYDROcodone-acetaminophen (NORCO) 10-325 MG per tablet Take 1 tablet by mouth every 4 (four) hours as needed. For pain   Yes Historical Provider, MD  lisinopril (PRINIVIL,ZESTRIL) 40 MG tablet Take 0.5 tablets (20 mg total) by mouth daily. 08/09/12  Yes Elliot Cousin, MD  lithium carbonate (LITHOBID) 300 MG CR tablet Take 300 mg by mouth 2 (two) times daily.   Yes Historical Provider, MD  omeprazole (PRILOSEC) 20 MG capsule Take 20 mg by mouth daily.   Yes Historical Provider, MD  potassium chloride (K-DUR) 10 MEQ tablet Take 20 mEq by mouth daily.   Yes Historical Provider, MD  propranolol ER (INDERAL LA) 60 MG 24 hr capsule Take 60 mg by mouth daily.   Yes Historical Provider, MD  QUEtiapine (SEROQUEL) 300 MG tablet Take 150 mg by mouth at bedtime.   Yes Historical Provider, MD  simvastatin (ZOCOR) 80 MG tablet Take 80 mg by mouth at bedtime.   Yes Historical Provider, MD  warfarin (COUMADIN) 5 MG tablet Take 2.5-5 mg by mouth daily. Take 5mg  on day 1, 2.5mg  on day 2, 5mg  on day 3, and continue accordingly   Yes Historical Provider, MD  nitroGLYCERIN (NITROSTAT) 0.4 MG SL tablet Place 0.4 mg under the tongue every 5 (five) minutes as needed. For chest pain    Historical  Provider, MD  ondansetron (ZOFRAN ODT) 8 MG disintegrating tablet Take 1 tablet (8 mg total) by mouth every 8 (eight) hours as needed for nausea. 10/12/12   [name], MD   Physical Exam: Filed Vitals:   10/31/12 1645  BP: 98/64  Pulse: 116  Resp: 20     General:  In no acute distress, sitting up in bed  Eyes: Pupils are equal round react to light  ENT: Mucous members are moist  Neck: Supple, no appreciable lymphadenopathy  Cardiovascular: S1, S2, irregular  Respiratory: Crackles at bases bilaterally  Abdomen: Soft, obese, nontender, positive bowel sounds  Skin: Erythema over nasal bridge and maxillary areas  Musculoskeletal: 1-2+ edema bilaterally no lower extremities  Psychiatric: Easily agitated, cooperates with exam  Neurologic: Grossly intact, nonfocal  Labs on Admission:  Basic Metabolic Panel:  Recent Labs Lab 10/31/12 1355  NA 144  K 4.8  CL 106  CO2 35*  GLUCOSE 131*  BUN 10  CREATININE 0.88  CALCIUM 8.7   Liver Function Tests: No results found for this basename: AST, ALT, ALKPHOS, BILITOT, PROT, ALBUMIN,  in the last 168 hours No results found for this basename: LIPASE, AMYLASE,  in the last 168 hours No results found for this basename: AMMONIA,  in the last 168 hours CBC:  Recent Labs Lab 10/31/12 1355  WBC 6.3  NEUTROABS 4.8  HGB 11.2*  HCT 35.2*  MCV 102.6*  PLT 212   Cardiac Enzymes:  Recent Labs Lab 10/31/12 1355  TROPONINI <0.30    BNP (last 3 results)  Recent Labs  08/08/12 1520 08/28/12 0755 10/31/12 1355  PROBNP 1061.0* 1356.0* 1622.0*   CBG: No results found for this basename: GLUCAP,  in the last 168 hours  Radiological Exams on Admission: Dg Chest Port 1 View  10/31/2012   *RADIOLOGY REPORT*  Clinical Data: Short of breath  PORTABLE CHEST - 1 VIEW  Comparison: 10/12/2012  Findings: Cardiac enlargement with vascular congestion.  Small pleural effusions and mild bibasilar atelectasis.  Negative for pneumonia.   IMPRESSION: Congestive heart failure with bilateral pleural effusions and bibasilar atelectasis.   Original Report Authenticated By: Janeece Riggers, M.D.    EKG: Independently reviewed. Atrial fib in 80s  Assessment/Plan Active Problems:   COPD (chronic obstructive pulmonary disease)   Tobacco abuse   DM (diabetes mellitus)   HTN (hypertension)   Obesity (BMI 30.0-34.9)   Chronic respiratory failure with hypoxia   Acute on chronic diastolic CHF (congestive heart failure)   Atrial fibrillation with RVR   1. Acute on chronic diastolic heart failure. Patient will be admitted to a step down unit. We will continue with supportive oxygen. We'll place on twice a day Lasix and check strict ins and outs. We'll hold beta blockers for now since he was placed on a Cardizem infusion. He is already on a statin and aspirin. We will repeat a 2-D echocardiogram. 2. Atrial fibrillation with a ventricular response. He'll be started on Cardizem infusion for rate control. He is already on Coumadin which will be managed by pharmacy. 3. COPD and chronic respiratory failure. He does not appear to have COPD exacerbation at this time. We will continue nebulizer treatments and home inhalers. 4. Diabetes. Sliding scale insulin 5. Hypertension. Appears stable at this time 6. Tobacco use. Counseled on the importance of tobacco cessation  Code Status: full code Family Communication: no family present Disposition Plan: to be determined  Time spent:  Brittin Belnap [facility] Pager [phone]  If 7PM-7AM, please contact night-coverage [email or web address] Password [facility] 10/31/2012, 5:13 PM

## 2012-10-31 NOTE — ED Notes (Signed)
Pt unresponsive to verbal stimulus, minimally responsive to painful stimulus.  MD made aware.

## 2012-10-31 NOTE — ED Notes (Signed)
Pt sitting in room yelling "doctor, doctor."  Continues rude behaviors with staff.

## 2012-10-31 NOTE — ED Notes (Addendum)
The pt continues with rude behavior towards staff, confrontational about treatments, remains alert and oriented x3.   Urinal provided at pt request. Bilateral groin reddened and raw with yellow white discharge on skin area.

## 2012-10-31 NOTE — ED Notes (Addendum)
Pt continues to be rude to staff, very demanding.  Provided with 16 oz ginger ale, no further given until dinner time due to fluid overload status.  Pt states that he is going to call his lawyer.  States that he wants to see the doctor, repeats this statement every 3-4 minutes.  Nursing staff on telemetry unit advised of patient's behaviors and attitude toward nursing.

## 2012-10-31 NOTE — ED Provider Notes (Signed)
CSN: [id number]     Arrival date & time 10/31/12  1308 History     First MD Initiated Contact with Patient 10/31/12 1316     Chief Complaint  Patient presents with  . Shortness of Breath    The history is provided by the EMS personnel and the patient. The history is limited by the condition of the patient (Lethargic, on Bipap).  Pt was seen at 1320.  Per EMS and pt report, pt with gradual onset and worsening of constant SOB for the past 1 week, worse since yesterday. Pt has a significant hx COPD and CHF, wears home O2, and continues to smoke cigarettes. Denies fevers, no CP, no abd pain, no N/V/D, no back pain.     Past Medical History  Diagnosis Date  . Hypertension   . Diabetes mellitus   . Bipolar 1 disorder   . Hyperlipidemia   . Atrial fibrillation   . COPD (chronic obstructive pulmonary disease)   . Obstructive sleep apnea   . Chronic diastolic heart failure     Echocardiogram 05/14/11: Mild LVH, EF 50-55%, no wall motion abnormalities, grade 2 diastolic dysfunction.  . Chest pain     Myoview 05/17/11 demonstrated an EF of 61%, small fixed inferoseptal defect consistent with thinning vs prior infarct; no ischemia. - low risk  . Diabetic neuropathy   . Chronic back pain   . History of stroke   . Mental disorder     bipolar  . CHF (congestive heart failure)   . Stroke   . Neuromuscular disorder     diabetic neuropathy  . Chronic respiratory failure with hypoxia 08/08/2012   Past Surgical History  Procedure Laterality Date  . No past surgeries      History  Substance Use Topics  . Smoking status: Current Every Day Smoker -- 1.50 packs/day for 29 years    Types: Cigarettes  . Smokeless tobacco: Never Used  . Alcohol Use: No    Review of Systems  Unable to perform ROS: Acuity of condition    Allergies  Flexeril and Muscle relief  Home Medications   Current Outpatient Rx  Name  Route  Sig  Dispense  Refill  . Aclidinium Bromide (TUDORZA PRESSAIR) 400  MCG/ACT AEPB   Inhalation   Inhale 1 puff into the lungs daily.         Marland Kitchen albuterol (PROVENTIL HFA;VENTOLIN HFA) 108 (90 BASE) MCG/ACT inhaler   Inhalation   Inhale 2 puffs into the lungs every 6 (six) hours as needed. For shortness of breath         . albuterol (PROVENTIL) (2.5 MG/3ML) 0.083% nebulizer solution   Nebulization   Take 2.5 mg by nebulization 3 (three) times daily as needed. For shortness of breath         . aspirin EC 81 MG tablet   Oral   Take 81 mg by mouth daily.         . budesonide-formoterol (SYMBICORT) 160-4.5 MCG/ACT inhaler   Inhalation   Inhale 2 puffs into the lungs 2 (two) times daily.         . clonazePAM (KLONOPIN) 0.5 MG tablet   Oral   Take 0.5 mg by mouth 2 (two) times daily.         . cloNIDine (CATAPRES) 0.2 MG tablet   Oral   Take 1 tablet (0.2 mg total) by mouth at bedtime.         . diazepam (VALIUM) 10 MG tablet  Oral   Take 1 tablet by mouth 2 (two) times daily as needed for anxiety or sleep.          Marland Kitchen diltiazem (CARDIZEM SR) 120 MG 12 hr capsule   Oral   Take 1 capsule (120 mg total) by mouth every 12 (twelve) hours. NEW MEDICATION TO CONTROL YOUR HEART RATE.   60 capsule   2   . furosemide (LASIX) 40 MG tablet   Oral   Take 80 mg by mouth daily.         Marland Kitchen gabapentin (NEURONTIN) 600 MG tablet   Oral   Take 600 mg by mouth 3 (three) times daily.         Marland Kitchen glimepiride (AMARYL) 2 MG tablet   Oral   Take 2 mg by mouth daily before breakfast.         . HYDROcodone-acetaminophen (NORCO) 10-325 MG per tablet   Oral   Take 1 tablet by mouth every 4 (four) hours as needed. For pain         . lisinopril (PRINIVIL,ZESTRIL) 40 MG tablet   Oral   Take 0.5 tablets (20 mg total) by mouth daily.         Marland Kitchen lithium carbonate (LITHOBID) 300 MG CR tablet   Oral   Take 300 mg by mouth 2 (two) times daily.         . nitroGLYCERIN (NITROSTAT) 0.4 MG SL tablet   Sublingual   Place 0.4 mg under the tongue  every 5 (five) minutes as needed. For chest pain         . omeprazole (PRILOSEC) 20 MG capsule   Oral   Take 20 mg by mouth daily.         . ondansetron (ZOFRAN ODT) 8 MG disintegrating tablet   Oral   Take 1 tablet (8 mg total) by mouth every 8 (eight) hours as needed for nausea.   10 tablet   0   . potassium chloride (K-DUR) 10 MEQ tablet   Oral   Take 20 mEq by mouth daily.         . propranolol ER (INDERAL LA) 60 MG 24 hr capsule   Oral   Take 60 mg by mouth daily.         . QUEtiapine (SEROQUEL) 300 MG tablet   Oral   Take 150 mg by mouth at bedtime.         . simvastatin (ZOCOR) 80 MG tablet   Oral   Take 80 mg by mouth at bedtime.         Marland Kitchen warfarin (COUMADIN) 5 MG tablet   Oral   Take 2.5-5 mg by mouth daily. Take 5mg  on day 1, 2.5mg  on day 2, 5mg  on day 3, and continue accordingly          BP 150/81  Pulse 94  Resp 21  Wt 300 lb (136.079 kg)  BMI 45.63 kg/m2  SpO2 100% Filed Vitals:   10/31/12 1415 10/31/12 1430 10/31/12 1545 10/31/12 1600  BP: 153/86 135/78 133/75 115/81  Pulse: 127 75  83  Resp: 19 20 18 15   Weight:      SpO2: 98% 100%  99%     Physical Exam 1325: Physical examination:  Nursing notes reviewed; Vital signs and O2 SAT reviewed;  Constitutional: Well developed, Well nourished, In no acute distress; Head:  Normocephalic, atraumatic; Eyes: EOMI, PERRL, No scleral icterus; ENMT: Mouth and pharynx normal, Mucous membranes dry; Neck: Supple,  Full range of motion, No lymphadenopathy; Cardiovascular: Tachycardic rate and irregular rhythm, No gallop; Respiratory: Breath sounds diminished & equal bilaterally, scattered faint wheezes. Speaking in phrases. Tachypneic. Sitting upright.; Chest: Nontender, Movement normal; Abdomen: Obese. Soft, Nontender, Nondistended, Normal bowel sounds; Genitourinary: No CVA tenderness; Extremities: Pulses normal, No tenderness, +1 pedal edema bilat. No calf asymmetry.; Neuro: Lethargic, but opens eyes to  name, and speaks few works. Major CN grossly intact. Moves all ext on stretcher spontaneously and to command without apparent gross focal motor deficits.; Skin: Color normal, Warm, Dry.   ED Course   1330:  Pt placed on bipap on arrival to ED for high 80% O2 Sats on 2-3L N/C. Pt with significant hx COPD and continues to smoke. Continuous neb started. IV steroids to be given.  1400:  CXR with CHF, will dose IV lasix.   1430:  Pt now minimally responsive to verbal and noxious stimuli, including gag reflex. Pt has urinated on himself. No seizure activity noted. No apnea or pulselessness. ABG without acute hypercarbia or hypoxia. Will need CT head. Decision to intubate pt made due to pt's questionable ability to maintain his airway. As preparations were being made to intubate pt, including laying him flat and putting a rolled towel under his shoulders, pt began to move around the bed spontaneously, open his eyes and mumble. When pt's dentures removed by RT, pt immediately sat up, fully awake and alert, and began to scream/yell and curse at ED staff in the exam room in a very loud voice. Resps easy, Sats 96% on R/A during this episode. Pt also had a few episodes of moist cough, with thick secretions suctioned from his mouth. Pt punching and kicking at staff attempting to calm him. Will hold intubation for now.   1500:  Pt continues A&O, resps without distress on O2 N/C. Sats now 100% on O2 2L N/C. Pt is now apologizing to ED staff for his previously behavior. Will admit.   1515:  T/C to [facility] Dr. Kerry Hough, case discussed, including:  HPI, pertinent PM/SHx, VS/PE, dx testing, ED course and treatment:  Agreeable to admit, requests to write temporary orders, obtain stepdown bed to team 1.    Procedures    MDM  MDM Reviewed: previous chart, nursing note and vitals Reviewed previous: labs, ECG and x-ray Interpretation: labs, ECG and x-ray Total time providing critical care: 30-74 minutes. This excludes  time spent performing separately reportable procedures and services. Consults: admitting MD   CRITICAL CARE Performed by: Laray Anger Total critical care time: 45 Critical care time was exclusive of separately billable procedures and treating other patients. Critical care was necessary to treat or prevent imminent or life-threatening deterioration. Critical care was time spent personally by me on the following activities: development of treatment plan with patient and/or surrogate as well as nursing, discussions with consultants, evaluation of patient's response to treatment, examination of patient, obtaining history from patient or surrogate, ordering and performing treatments and interventions, ordering and review of laboratory studies, ordering and review of radiographic studies, pulse oximetry and re-evaluation of patient's condition.    Date: 10/31/2012  Rate: 89  Rhythm: atrial fibrillation  QRS Axis: normal  Intervals: normal  ST/T Wave abnormalities: normal  Conduction Disutrbances:nonspecific intraventricular conduction delay  Narrative Interpretation:   Old EKG Reviewed: unchanged; no significant changes from previous EKG dated 10/12/2012.   Results for orders placed during the hospital encounter of 10/31/12  CBC WITH DIFFERENTIAL      Result Value Range  WBC 6.3  4.0 - 10.5 K/uL   RBC 3.43 (*) 4.22 - 5.81 MIL/uL   Hemoglobin 11.2 (*) 13.0 - 17.0 g/dL   HCT 40.9 (*) 81.1 - 91.4 %   MCV 102.6 (*) 78.0 - 100.0 fL   MCH 32.7  26.0 - 34.0 pg   MCHC 31.8  30.0 - 36.0 g/dL   RDW 78.2  95.6 - 21.3 %   Platelets 212  150 - 400 K/uL   Neutrophils Relative % 76  43 - 77 %   Neutro Abs 4.8  1.7 - 7.7 K/uL   Lymphocytes Relative 19  12 - 46 %   Lymphs Abs 1.2  0.7 - 4.0 K/uL   Monocytes Relative 4  3 - 12 %   Monocytes Absolute 0.2  0.1 - 1.0 K/uL   Eosinophils Relative 1  0 - 5 %   Eosinophils Absolute 0.1  0.0 - 0.7 K/uL   Basophils Relative 0  0 - 1 %   Basophils  Absolute 0.0  0.0 - 0.1 K/uL  BASIC METABOLIC PANEL      Result Value Range   Sodium 144  135 - 145 mEq/L   Potassium 4.8  3.5 - 5.1 mEq/L   Chloride 106  96 - 112 mEq/L   CO2 35 (*) 19 - 32 mEq/L   Glucose, Bld 131 (*) 70 - 99 mg/dL   BUN 10  6 - 23 mg/dL   Creatinine, Ser 0.86  0.50 - 1.35 mg/dL   Calcium 8.7  8.4 - 57.8 mg/dL   GFR calc non Af Amer >90  >90 mL/min   GFR calc Af Amer >90  >90 mL/min  PRO B NATRIURETIC PEPTIDE      Result Value Range   Pro B Natriuretic peptide (BNP) 1622.0 (*) 0 - 125 pg/mL  TROPONIN I      Result Value Range   Troponin I <0.30  <0.30 ng/mL  BLOOD GAS, ARTERIAL      Result Value Range   FIO2 100.00     Delivery systems BILEVEL POSITIVE AIRWAY PRESSURE     Rate 10     Inspiratory PAP 12.0     Expiratory PAP 6.0     pH, Arterial 7.437  7.350 - 7.450   pCO2 arterial 47.4 (*) 35.0 - 45.0 mmHg   pO2, Arterial 168.0 (*) 80.0 - 100.0 mmHg   Bicarbonate 31.5 (*) 20.0 - 24.0 mEq/L   TCO2 28.5  0 - 100 mmol/L   Acid-Base Excess 7.1 (*) 0.0 - 2.0 mmol/L   O2 Saturation 99.6     Patient temperature 37.0     Collection site RIGHT RADIAL     Drawn by COLLECTED BY RT     Allens test (pass/fail) PASS  PASS  PROTIME-INR      Result Value Range   Prothrombin Time 14.6  11.6 - 15.2 seconds   INR 1.16  0.00 - 1.49   Dg Chest Port 1 View 10/31/2012   *RADIOLOGY REPORT*  Clinical Data: Short of breath  PORTABLE CHEST - 1 VIEW  Comparison: 10/12/2012  Findings: Cardiac enlargement with vascular congestion.  Small pleural effusions and mild bibasilar atelectasis.  Negative for pneumonia.  IMPRESSION: Congestive heart failure with bilateral pleural effusions and bibasilar atelectasis.   Original Report Authenticated By: Janeece Riggers, M.D.              Laray Anger, DO 11/02/12 2202

## 2012-10-31 NOTE — ED Notes (Signed)
Bilateral 2+ to 3+ edema noted to lower extremities.

## 2012-10-31 NOTE — ED Notes (Signed)
Dr. Clarene Duke at bedside for intubation, RT at bedside.

## 2012-10-31 NOTE — Progress Notes (Signed)
ANTICOAGULATION CONSULT NOTE - Initial Consult  Pharmacy Consult for Coumadin Indication: atrial fibrillation  Allergies  Allergen Reactions  . Flexeril [Cyclobenzaprine] Other (See Comments)    Alters Mental Status-ALL MUSCLE RELAXERS  . Muscle Relief [Capsaicin]     Hysteria     Patient Measurements: Weight: 300 lb (136.079 kg)  Vital Signs: BP: 101/74 mmHg (08/15 1800) Pulse Rate: 124 (08/15 1800)  Labs:  Recent Labs  10/31/12 1327 10/31/12 1355  HGB  --  11.2*  HCT  --  35.2*  PLT  --  212  LABPROT 14.6  --   INR 1.16  --   CREATININE  --  0.88  TROPONINI  --  <0.30    The CrCl is unknown because both a height and weight (above a minimum accepted value) are required for this calculation.   Medical History: Past Medical History  Diagnosis Date  . Hypertension   . Diabetes mellitus   . Bipolar 1 disorder   . Hyperlipidemia   . Atrial fibrillation   . COPD (chronic obstructive pulmonary disease)   . Obstructive sleep apnea   . Chronic diastolic heart failure     Echocardiogram 05/14/11: Mild LVH, EF 50-55%, no wall motion abnormalities, grade 2 diastolic dysfunction.  . Chest pain     Myoview 05/17/11 demonstrated an EF of 61%, small fixed inferoseptal defect consistent with thinning vs prior infarct; no ischemia. - low risk  . Diabetic neuropathy   . Chronic back pain   . History of stroke   . Mental disorder     bipolar  . CHF (congestive heart failure)   . Stroke   . Neuromuscular disorder     diabetic neuropathy  . Chronic respiratory failure with hypoxia 08/08/2012    Medications:  Prescriptions prior to admission  Medication Sig Dispense Refill  . Aclidinium Bromide (TUDORZA PRESSAIR) 400 MCG/ACT AEPB Inhale 1 puff into the lungs daily.      Marland Kitchen albuterol (PROVENTIL HFA;VENTOLIN HFA) 108 (90 BASE) MCG/ACT inhaler Inhale 2 puffs into the lungs every 6 (six) hours as needed. For shortness of breath      . albuterol (PROVENTIL) (2.5 MG/3ML) 0.083%  nebulizer solution Take 2.5 mg by nebulization 3 (three) times daily as needed. For shortness of breath      . aspirin EC 81 MG tablet Take 81 mg by mouth daily.      . budesonide-formoterol (SYMBICORT) 160-4.5 MCG/ACT inhaler Inhale 2 puffs into the lungs 2 (two) times daily.      . clonazePAM (KLONOPIN) 0.5 MG tablet Take 0.5 mg by mouth 2 (two) times daily.      . cloNIDine (CATAPRES) 0.2 MG tablet Take 1 tablet (0.2 mg total) by mouth at bedtime.      . diazepam (VALIUM) 10 MG tablet Take 1 tablet by mouth 2 (two) times daily as needed for anxiety or sleep.       Marland Kitchen diltiazem (CARDIZEM SR) 120 MG 12 hr capsule Take 1 capsule (120 mg total) by mouth every 12 (twelve) hours. NEW MEDICATION TO CONTROL YOUR HEART RATE.  60 capsule  2  . furosemide (LASIX) 40 MG tablet Take 80 mg by mouth daily.      Marland Kitchen gabapentin (NEURONTIN) 600 MG tablet Take 600 mg by mouth 3 (three) times daily.      Marland Kitchen glimepiride (AMARYL) 2 MG tablet Take 2 mg by mouth daily before breakfast.      . HYDROcodone-acetaminophen (NORCO) 10-325 MG per tablet Take 1  tablet by mouth every 4 (four) hours as needed. For pain      . lisinopril (PRINIVIL,ZESTRIL) 40 MG tablet Take 0.5 tablets (20 mg total) by mouth daily.      Marland Kitchen lithium carbonate (LITHOBID) 300 MG CR tablet Take 300 mg by mouth 2 (two) times daily.      Marland Kitchen omeprazole (PRILOSEC) 20 MG capsule Take 20 mg by mouth daily.      . potassium chloride (K-DUR) 10 MEQ tablet Take 20 mEq by mouth daily.      . propranolol ER (INDERAL LA) 60 MG 24 hr capsule Take 60 mg by mouth daily.      . QUEtiapine (SEROQUEL) 300 MG tablet Take 150 mg by mouth at bedtime.      . simvastatin (ZOCOR) 80 MG tablet Take 80 mg by mouth at bedtime.      Marland Kitchen warfarin (COUMADIN) 5 MG tablet Take 2.5-5 mg by mouth daily. Take 5mg  on day 1, 2.5mg  on day 2, 5mg  on day 3, and continue accordingly      . nitroGLYCERIN (NITROSTAT) 0.4 MG SL tablet Place 0.4 mg under the tongue every 5 (five) minutes as needed. For  chest pain      . ondansetron (ZOFRAN ODT) 8 MG disintegrating tablet Take 1 tablet (8 mg total) by mouth every 8 (eight) hours as needed for nausea.  10 tablet  0    Assessment: 57yo male on chronic Coumadin for h/o afib.  INR at baseline on admission Goal of Therapy:  INR 2-3 Monitor platelets by anticoagulation protocol: Yes   Plan:  Coumadin 7.5mg  tonight INR daily  Sean Warren A 10/31/2012,9:07 PM

## 2012-10-31 NOTE — ED Notes (Addendum)
Pt becomes awake, aggitated and screaming at staff stating that he is leaving, cursing and spitting at staff.  Urinates on stretcher, swinging at staff, is alert and oriented x3. States for staff not to touch him.  MD at bedside at witnesses behaviors.

## 2012-11-01 ENCOUNTER — Encounter (HOSPITAL_COMMUNITY): Payer: Self-pay

## 2012-11-01 LAB — CBC
HCT: 35.1 % — ABNORMAL LOW (ref 39.0–52.0)
Hemoglobin: 11.2 g/dL — ABNORMAL LOW (ref 13.0–17.0)
RBC: 3.46 MIL/uL — ABNORMAL LOW (ref 4.22–5.81)

## 2012-11-01 LAB — BASIC METABOLIC PANEL
Chloride: 101 mEq/L (ref 96–112)
GFR calc Af Amer: 82 mL/min — ABNORMAL LOW (ref >90)
GFR calc non Af Amer: 71 mL/min — ABNORMAL LOW (ref >90)
Glucose, Bld: 291 mg/dL — ABNORMAL HIGH (ref 70–99)
Potassium: 4.8 mEq/L (ref 3.5–5.1)
Sodium: 140 mEq/L (ref 135–145)

## 2012-11-01 LAB — PROTIME-INR: Prothrombin Time: 15.8 seconds — ABNORMAL HIGH (ref 11.6–15.2)

## 2012-11-01 LAB — GLUCOSE, CAPILLARY: Glucose-Capillary: 279 mg/dL — ABNORMAL HIGH (ref 70–99)

## 2012-11-01 LAB — TROPONIN I: Troponin I: <0.3 ng/mL (ref <0.30)

## 2012-11-01 MED ORDER — IPRATROPIUM BROMIDE 0.02 % IN SOLN
0.5000 mg | Freq: Four times a day (QID) | RESPIRATORY_TRACT | Status: DC
  Administered 2012-11-01: 0.5 mg via RESPIRATORY_TRACT
  Filled 2012-11-01: qty 2.5

## 2012-11-01 MED ORDER — QUETIAPINE FUMARATE 100 MG PO TABS
150.0000 mg | ORAL_TABLET | Freq: Every day | ORAL | Status: DC
Start: 2012-11-01 — End: 2012-11-01
  Filled 2012-11-01 (×2): qty 3

## 2012-11-01 MED ORDER — WARFARIN SODIUM 7.5 MG PO TABS: 7.5000 mg | ORAL_TABLET | Freq: Once | ORAL | Status: DC

## 2012-11-01 NOTE — Plan of Care (Signed)
Problem: Phase I Progression Outcomes Goal: Voiding-avoid urinary catheter unless indicated Outcome: Not Progressing Foley placed in ed

## 2012-11-01 NOTE — Progress Notes (Signed)
Discharged patient with instructions he verbalizes understanding, but continues to be noncompliant in regards to diet.  The patient was transferred off the floor via w/c with staff in stable condition.

## 2012-11-01 NOTE — Discharge Summary (Signed)
Physician Discharge Summary  Sean Warren ZOX:[id number] DOB: 1955/05/13 DOA: 10/31/2012  PCP: [name], MD  Admit date: 10/31/2012 Discharge date: 11/01/2012  Time spent: Greater than 30 minutes  Recommendations for Outpatient Follow-up:  1. Followup with primary care physician.  Discharge Diagnoses:  1. Acute on chronic diastolic congestive heart failure, resolved clinically. Patient admits to not taking his diuretics as prescribed. 2. Atrial fibrillation with rapid ventricular response, improved. 3. Ongoing tobacco abuse. 4. COPD, stable. 5. Hypertension. 6. Diabetes mellitus. 7. Obesity. 8. Noncompliance.   Discharge Condition: Stable and improved.  Diet recommendation: Carbohydrate modified diet.  Filed Weights   10/31/12 1313  Weight: 136.079 kg (300 lb)    History of present illness:  This 57 year old man presented to the hospital with symptoms of dyspnea. Please see initial history as outlined below: HPI: Sean Warren is a 57 y.o. male with chronic respiratory failure on 2 L of oxygen, COPD, chronic diastolic congestive heart failure. He presents to the emergency room with progressive shortness of breath. He reports onset of symptoms approximately 2-3 days ago. He is progressively getting worse. He has a bit of a cough. Denies any fever. No nausea or vomiting. Had diarrhea the past but none recently, no chest pain. He reports that he has not been compliant with his dietary salt intake. He is on Lasix twice a day and reports compliance with this. He was brought to the emergency room by EMS and was found to be very short of breath. He required BiPAP placement by EMS. He was observed in the emergency room and was found to be extremely lethargic. Initial plans were made to intubate the patient for airway protection. Once patient was prepped for intubation, respiratory therapist plan to remove his dentures. At this point the patient probably woke up and began to yell at  staff. Since that time, he has been on nasal cannula. Chest x-ray shows evidence of acute CHF. He's been given Lasix with good urine output. Heart rate was found to be elevated in the 120s to 1:30 range, atrial fibrillation. Patient is referred for admission.  Hospital Course:  The patient was apparently quite sick when he was admitted with congestive heart failure. However with diuresis and very short-term BiPAP, he has improved significantly. Today, he admits that he continues to smoke cigarettes but more importantly in this particular case, he says he has not been taking his Lasix as prescribed recently. Initially he had told my colleague that he was compliant with Lasix. Now, he admits that he has not been compliant. In any case serial cardiac enzymes were negative. There is no signs of an acute cardiac event. It seems that he has gone to congestive heart failure secondary to noncompliance with his diuretics. He is now stable for discharge. His ventricular rate is reasonable around 100 and he will continue to take his Cardizem as prescribed at home. He is on anticoagulation also. He will followup with his primary care physician. He has been counseled once again about tobacco cessation.  Procedures:  None.   Consultations:  None.  Discharge Exam: Filed Vitals:   11/01/12 0645  BP: 158/106  Pulse: 105  Temp:   Resp: 27    General: He looks chronically sick but is not acutely unwell at this time. Cardiovascular: Heart sounds are present in atrial fibrillation with a ventricular rate of 90. Respiratory: Lung fields are relatively clear with no evidence of wheezing or crackles. There is no bronchial breathing. There is no increased  work of breathing. There is no peripheral central cyanosis. He is alert and orientated.  Discharge Instructions  Discharge Orders   Future Orders Complete By Expires   Diet - low sodium heart healthy  As directed    Increase activity slowly  As directed         Medication List         albuterol 108 (90 BASE) MCG/ACT inhaler  Commonly known as:  PROVENTIL HFA;VENTOLIN HFA  Inhale 2 puffs into the lungs every 6 (six) hours as needed. For shortness of breath     albuterol (2.5 MG/3ML) 0.083% nebulizer solution  Commonly known as:  PROVENTIL  Take 2.5 mg by nebulization 3 (three) times daily as needed. For shortness of breath     aspirin EC 81 MG tablet  Take 81 mg by mouth daily.     budesonide-formoterol 160-4.5 MCG/ACT inhaler  Commonly known as:  SYMBICORT  Inhale 2 puffs into the lungs 2 (two) times daily.     clonazePAM 0.5 MG tablet  Commonly known as:  KLONOPIN  Take 0.5 mg by mouth 2 (two) times daily.     cloNIDine 0.2 MG tablet  Commonly known as:  CATAPRES  Take 1 tablet (0.2 mg total) by mouth at bedtime.     diazepam 10 MG tablet  Commonly known as:  VALIUM  Take 1 tablet by mouth 2 (two) times daily as needed for anxiety or sleep.     diltiazem 120 MG 12 hr capsule  Commonly known as:  CARDIZEM SR  Take 1 capsule (120 mg total) by mouth every 12 (twelve) hours. NEW MEDICATION TO CONTROL YOUR HEART RATE.     furosemide 40 MG tablet  Commonly known as:  LASIX  Take 80 mg by mouth daily.     gabapentin 600 MG tablet  Commonly known as:  NEURONTIN  Take 600 mg by mouth 3 (three) times daily.     glimepiride 2 MG tablet  Commonly known as:  AMARYL  Take 2 mg by mouth daily before breakfast.     HYDROcodone-acetaminophen 10-325 MG per tablet  Commonly known as:  NORCO  Take 1 tablet by mouth every 4 (four) hours as needed. For pain     lisinopril 40 MG tablet  Commonly known as:  PRINIVIL,ZESTRIL  Take 0.5 tablets (20 mg total) by mouth daily.     lithium carbonate 300 MG CR tablet  Commonly known as:  LITHOBID  Take 300 mg by mouth 2 (two) times daily.     nitroGLYCERIN 0.4 MG SL tablet  Commonly known as:  NITROSTAT  Place 0.4 mg under the tongue every 5 (five) minutes as needed. For chest pain      omeprazole 20 MG capsule  Commonly known as:  PRILOSEC  Take 20 mg by mouth daily.     ondansetron 8 MG disintegrating tablet  Commonly known as:  ZOFRAN ODT  Take 1 tablet (8 mg total) by mouth every 8 (eight) hours as needed for nausea.     potassium chloride 10 MEQ tablet  Commonly known as:  K-DUR  Take 20 mEq by mouth daily.     propranolol ER 60 MG 24 hr capsule  Commonly known as:  INDERAL LA  Take 60 mg by mouth daily.     QUEtiapine 300 MG tablet  Commonly known as:  SEROQUEL  Take 150 mg by mouth at bedtime.     simvastatin 80 MG tablet  Commonly known as:  ZOCOR  Take 80 mg by mouth at bedtime.     TUDORZA PRESSAIR 400 MCG/ACT Aepb  Generic drug:  Aclidinium Bromide  Inhale 1 puff into the lungs daily.     warfarin 5 MG tablet  Commonly known as:  COUMADIN  Take 2.5-5 mg by mouth daily. Take 5mg  on day 1, 2.5mg  on day 2, 5mg  on day 3, and continue accordingly       Allergies  Allergen Reactions  . Flexeril [Cyclobenzaprine] Other (See Comments)    Alters Mental Status-ALL MUSCLE RELAXERS  . Muscle Relief [Capsaicin]     Hysteria        Follow-up Information   Follow up with [facility], [place]. Call in 2 days. (Called to followup with your primary care physician.)    Specialty:  Internal Medicine   Contact information:   [address] [place] [address]        The results of significant diagnostics from this hospitalization (including imaging, microbiology, ancillary and laboratory) are listed below for reference.    Significant Diagnostic Studies: Dg Chest 2 View  10/12/2012   *RADIOLOGY REPORT*  Clinical Data: Nonproductive cough, shortness of breath, history of COPD and CHF, evaluate for pneumonia  CHEST - 2 VIEW  Comparison: 08/28/2012; 08/08/2012; 03/03/2009  Findings:  Examination is degraded secondary to patient body habitus.  Grossly unchanged and enlarged cardiac silhouette and mediastinal contours. Evaluation of the retrosternal clear  space is obscured secondary to overlying soft tissues.  The lungs again appear hyperinflated with flattening of bilateral hemidiaphragms and mild diffuse slightly nodular thickening of the interstitium. Heterogeneous slightly nodular air space opacities within the left lower lung (best appreciated on lateral radiograph), appear to have progressed since recently performed chest radiograph of 08/28/2012. No pleural effusion or pneumothorax. No definite evidence of edema. No acute osseous abnormality.  IMPRESSION: 1.  Slightly nodular air space opacities within the left lower lung (best appreciated the lateral radiograph) may represent areas of infection though discrete underlying nodules are not excluded.  As such, a follow-up chest radiograph in 4 to 6 weeks after treatment is recommended to ensure resolution. 2.  Persistent findings of cardiomegaly and lung hyperexpansion.   Original Report Authenticated By: Tacey Ruiz, MD   Dg Chest Port 1 View  10/31/2012   *RADIOLOGY REPORT*  Clinical Data: Short of breath  PORTABLE CHEST - 1 VIEW  Comparison: 10/12/2012  Findings: Cardiac enlargement with vascular congestion.  Small pleural effusions and mild bibasilar atelectasis.  Negative for pneumonia.  IMPRESSION: Congestive heart failure with bilateral pleural effusions and bibasilar atelectasis.   Original Report Authenticated By: Janeece Riggers, M.D.    Microbiology: Recent Results (from the past 240 hour(s))  MRSA PCR SCREENING     Status: None   Collection Time    10/31/12  8:40 PM      Result Value Range Status   MRSA by PCR NEGATIVE  NEGATIVE Final   Comment:            The GeneXpert MRSA Assay (FDA     approved for NASAL specimens     only), is one component of a     comprehensive MRSA colonization     surveillance program. It is not     intended to diagnose MRSA     infection nor to guide or     monitor treatment for     MRSA infections.     Labs: Basic Metabolic Panel:  Recent Labs Lab  10/31/12 1355  11/01/12 0230  NA 144 140  K 4.8 4.8  CL 106 101  CO2 35* 35*  GLUCOSE 131* 291*  BUN 10 14  CREATININE 0.88 1.11  CALCIUM 8.7 8.6      CBC:  Recent Labs Lab 10/31/12 1355 11/01/12 0230  WBC 6.3 7.2  NEUTROABS 4.8  --   HGB 11.2* 11.2*  HCT 35.2* 35.1*  MCV 102.6* 101.4*  PLT 212 221   Cardiac Enzymes:  Recent Labs Lab 10/31/12 1355 10/31/12 2039 11/01/12 0230  TROPONINI <0.30 <0.30 <0.30   BNP: BNP (last 3 results)  Recent Labs  08/08/12 1520 08/28/12 0755 10/31/12 1355  PROBNP 1061.0* 1356.0* 1622.0*   CBG:  Recent Labs Lab 11/01/12 0737  GLUCAP 279*       Signed:  Zailyn Thoennes C  [facility] 11/01/2012, 8:54 AM

## 2012-11-05 NOTE — Progress Notes (Signed)
UR Chart Review Completed  

## 2012-11-13 LAB — PULMONARY FUNCTION TEST

## 2012-11-17 ENCOUNTER — Emergency Department (HOSPITAL_COMMUNITY): Payer: Medicare Other

## 2012-11-17 ENCOUNTER — Encounter (HOSPITAL_COMMUNITY): Payer: Self-pay | Admitting: *Deleted

## 2012-11-17 ENCOUNTER — Inpatient Hospital Stay (HOSPITAL_COMMUNITY)
Admission: EM | Admit: 2012-11-17 | Discharge: 2012-11-19 | DRG: 189 | Disposition: A | Discharge status: Home / Self Care | Payer: Medicare Other | Attending: Internal Medicine | Admitting: Internal Medicine

## 2012-11-17 DIAGNOSIS — E785 Hyperlipidemia, unspecified: Secondary | ICD-10-CM

## 2012-11-17 DIAGNOSIS — I10 Essential (primary) hypertension: Secondary | ICD-10-CM | POA: Diagnosis present

## 2012-11-17 DIAGNOSIS — R55 Syncope and collapse: Secondary | ICD-10-CM

## 2012-11-17 DIAGNOSIS — F319 Bipolar disorder, unspecified: Secondary | ICD-10-CM | POA: Diagnosis present

## 2012-11-17 DIAGNOSIS — G4733 Obstructive sleep apnea (adult) (pediatric): Secondary | ICD-10-CM | POA: Diagnosis present

## 2012-11-17 DIAGNOSIS — Z720 Tobacco use: Secondary | ICD-10-CM

## 2012-11-17 DIAGNOSIS — E876 Hypokalemia: Secondary | ICD-10-CM

## 2012-11-17 DIAGNOSIS — R0600 Dyspnea, unspecified: Secondary | ICD-10-CM

## 2012-11-17 DIAGNOSIS — E119 Type 2 diabetes mellitus without complications: Secondary | ICD-10-CM

## 2012-11-17 DIAGNOSIS — I639 Cerebral infarction, unspecified: Secondary | ICD-10-CM

## 2012-11-17 DIAGNOSIS — Z7901 Long term (current) use of anticoagulants: Secondary | ICD-10-CM

## 2012-11-17 DIAGNOSIS — J449 Chronic obstructive pulmonary disease, unspecified: Secondary | ICD-10-CM

## 2012-11-17 DIAGNOSIS — Z91199 Patient's noncompliance with other medical treatment and regimen due to unspecified reason: Secondary | ICD-10-CM

## 2012-11-17 DIAGNOSIS — J40 Bronchitis, not specified as acute or chronic: Secondary | ICD-10-CM

## 2012-11-17 DIAGNOSIS — I5032 Chronic diastolic (congestive) heart failure: Secondary | ICD-10-CM | POA: Diagnosis present

## 2012-11-17 DIAGNOSIS — E1142 Type 2 diabetes mellitus with diabetic polyneuropathy: Secondary | ICD-10-CM | POA: Diagnosis present

## 2012-11-17 DIAGNOSIS — J189 Pneumonia, unspecified organism: Secondary | ICD-10-CM

## 2012-11-17 DIAGNOSIS — R000 Tachycardia, unspecified: Secondary | ICD-10-CM

## 2012-11-17 DIAGNOSIS — I4891 Unspecified atrial fibrillation: Secondary | ICD-10-CM

## 2012-11-17 DIAGNOSIS — J9611 Chronic respiratory failure with hypoxia: Secondary | ICD-10-CM

## 2012-11-17 DIAGNOSIS — J9620 Acute and chronic respiratory failure, unspecified whether with hypoxia or hypercapnia: Principal | ICD-10-CM | POA: Diagnosis present

## 2012-11-17 DIAGNOSIS — R21 Rash and other nonspecific skin eruption: Secondary | ICD-10-CM

## 2012-11-17 DIAGNOSIS — F17200 Nicotine dependence, unspecified, uncomplicated: Secondary | ICD-10-CM | POA: Diagnosis present

## 2012-11-17 DIAGNOSIS — G8929 Other chronic pain: Secondary | ICD-10-CM | POA: Diagnosis present

## 2012-11-17 DIAGNOSIS — R4182 Altered mental status, unspecified: Secondary | ICD-10-CM

## 2012-11-17 DIAGNOSIS — J441 Chronic obstructive pulmonary disease with (acute) exacerbation: Secondary | ICD-10-CM | POA: Diagnosis present

## 2012-11-17 DIAGNOSIS — I509 Heart failure, unspecified: Secondary | ICD-10-CM

## 2012-11-17 DIAGNOSIS — E1149 Type 2 diabetes mellitus with other diabetic neurological complication: Secondary | ICD-10-CM | POA: Diagnosis present

## 2012-11-17 DIAGNOSIS — Z8673 Personal history of transient ischemic attack (TIA), and cerebral infarction without residual deficits: Secondary | ICD-10-CM

## 2012-11-17 DIAGNOSIS — I5033 Acute on chronic diastolic (congestive) heart failure: Secondary | ICD-10-CM

## 2012-11-17 DIAGNOSIS — Z9119 Patient's noncompliance with other medical treatment and regimen: Secondary | ICD-10-CM

## 2012-11-17 DIAGNOSIS — E669 Obesity, unspecified: Secondary | ICD-10-CM

## 2012-11-17 DIAGNOSIS — I4892 Unspecified atrial flutter: Secondary | ICD-10-CM

## 2012-11-17 DIAGNOSIS — I480 Paroxysmal atrial fibrillation: Secondary | ICD-10-CM

## 2012-11-17 DIAGNOSIS — Z79899 Other long term (current) drug therapy: Secondary | ICD-10-CM

## 2012-11-17 HISTORY — DX: Patient's other noncompliance with medication regimen for other reason: Z91.148

## 2012-11-17 HISTORY — DX: Tobacco use: Z72.0

## 2012-11-17 HISTORY — DX: Patient's other noncompliance with medication regimen: Z91.14

## 2012-11-17 LAB — BLOOD GAS, ARTERIAL
Acid-Base Excess: 8 mmol/L — ABNORMAL HIGH (ref 0.0–2.0)
Delivery systems: POSITIVE
FIO2: 40 %
Patient temperature: 37
TCO2: 31.7 mmol/L (ref 0–100)

## 2012-11-17 LAB — CBC WITH DIFFERENTIAL/PLATELET
Eosinophils Relative: 1 % (ref 0–5)
Lymphocytes Relative: 20 % (ref 12–46)
Monocytes Relative: 6 % (ref 3–12)
Neutrophils Relative %: 73 % (ref 43–77)
Platelets: 214 10*3/uL (ref 150–400)
RBC: 3.64 MIL/uL — ABNORMAL LOW (ref 4.22–5.81)
WBC: 9.2 10*3/uL (ref 4.0–10.5)

## 2012-11-17 LAB — PRO B NATRIURETIC PEPTIDE: Pro B Natriuretic peptide (BNP): 1300 pg/mL — ABNORMAL HIGH (ref 0–125)

## 2012-11-17 LAB — URINALYSIS W MICROSCOPIC + REFLEX CULTURE
Bilirubin Urine: NEGATIVE
Leukocytes, UA: NEGATIVE
Nitrite: NEGATIVE
Specific Gravity, Urine: 1.02 (ref 1.005–1.030)
pH: 5.5 (ref 5.0–8.0)

## 2012-11-17 LAB — BASIC METABOLIC PANEL
Chloride: 106 mEq/L (ref 96–112)
Creatinine, Ser: 1.27 mg/dL (ref 0.50–1.35)
GFR calc Af Amer: 70 mL/min — ABNORMAL LOW (ref >90)
Potassium: 5.2 mEq/L — ABNORMAL HIGH (ref 3.5–5.1)
Sodium: 145 mEq/L (ref 135–145)

## 2012-11-17 LAB — HEPATIC FUNCTION PANEL
ALT: 6 U/L (ref 0–53)
AST: 7 U/L (ref 0–37)
Bilirubin, Direct: <0.1 mg/dL (ref 0.0–0.3)
Total Protein: 6.5 g/dL (ref 6.0–8.3)

## 2012-11-17 LAB — PROTIME-INR: INR: 1.38 (ref 0.00–1.49)

## 2012-11-17 LAB — RAPID URINE DRUG SCREEN, HOSP PERFORMED
Barbiturates: NOT DETECTED
Cocaine: NOT DETECTED
Opiates: POSITIVE — AB

## 2012-11-17 MED ORDER — ALBUTEROL (5 MG/ML) CONTINUOUS INHALATION SOLN
10.0000 mg/h | INHALATION_SOLUTION | Freq: Once | RESPIRATORY_TRACT | Status: AC
  Administered 2012-11-17: 10 mg/h via RESPIRATORY_TRACT
  Filled 2012-11-17: qty 20

## 2012-11-17 MED ORDER — NALOXONE HCL 0.4 MG/ML IJ SOLN
0.4000 mg | Freq: Once | INTRAMUSCULAR | Status: AC
  Administered 2012-11-17: 0.4 mg via INTRAVENOUS
  Filled 2012-11-17: qty 1

## 2012-11-17 MED ORDER — IPRATROPIUM BROMIDE 0.02 % IN SOLN
1.0000 mg | Freq: Once | RESPIRATORY_TRACT | Status: AC
  Administered 2012-11-17: 1 mg via RESPIRATORY_TRACT
  Filled 2012-11-17: qty 5

## 2012-11-17 MED ORDER — METHYLPREDNISOLONE SODIUM SUCC 125 MG IJ SOLR
125.0000 mg | Freq: Once | INTRAMUSCULAR | Status: AC
  Administered 2012-11-17: 125 mg via INTRAVENOUS
  Filled 2012-11-17: qty 2

## 2012-11-17 NOTE — ED Notes (Signed)
CRITICAL VALUE ALERT  Critical value received:  Ph 7.254                                        PCO2 81.8                                        PO2  56.9                                        SAT 88.2                                        Bi carb   35.0  Date of notification:  11/17/12  Time of notification:  2317  Critical value read back:yes  Nurse who received alert:  Thornton Dales, RN  MD notified (1st page): Clarene Duke  Time of first page:  2319  MD notified (2nd page):  Time of second page:  Responding MD:  Clarene Duke  Time MD responded:  [phone]

## 2012-11-17 NOTE — ED Notes (Signed)
Pt aroused w/ painful stimuli again because sats dropping, pt states he lives everyday with low O2 sats. Pt sats again improved to 86% while pt awake.  EDP wants repeat ABG after 45 minutes on Bipap.

## 2012-11-17 NOTE — ED Notes (Signed)
Pt arrived from home by EMS. Called out for SOB, family states they think pt is taking too much, klonopin & hydrocodone & that pt has been talking about death & cremation more lately. Pt arrived w/ non rebreather mask & unremarkable vitals as per EMS

## 2012-11-17 NOTE — ED Notes (Signed)
Iv fluids stopped as per admitting MD.

## 2012-11-17 NOTE — ED Notes (Signed)
EDP advised of pt O2 sats dropping since being put on Bipap, EDP states pt did the same 3 weeks ago. Pt aroused w/ painful stimuli & sats returned back to 86%. Pt went back to sleep & sats again began to drop.

## 2012-11-17 NOTE — ED Notes (Signed)
Pt states he wants to go home. This nurse & respiratory talked about O2 sats being low, pt states they are always like that & wants to go home. Pt convinced to wear BiPAP at this time to see how it makes him feel.

## 2012-11-17 NOTE — ED Provider Notes (Signed)
CSN: [id number]     Arrival date & time 11/17/12  1922 History   First MD Initiated Contact with Patient 11/17/12 1949     Chief Complaint  Patient presents with  . Shortness of Breath  . Altered Mental Status    Patient is a 57 y.o. male presenting with shortness of breath and altered mental status. The history is provided by the EMS personnel, the patient and a relative. The history is limited by the condition of the patient (AMS).  Shortness of Breath Altered Mental Status  Pt was seen at 2000.  Per EMS and family report, pt with gradual onset and worsening of persistent SOB and AMS for an unknown period of time. EMS states pt's family told them they "think he's taking too much klonopin and hydrocodone" and has been "talking about death and cremation more lately." EMS applied NRB O2 due to O2 Sats in the 80's despite pt wearing his O2 N/C. Pt has significant hx home O2, COPD, CHF and medication non-compliance. Pt also continues to smoke cigarettes.  The symptoms have been associated with no other complaints. The patient has a significant history of similar symptoms previously, recently being evaluated for this complaint and multiple prior evals for same, including hospital admission 2 weeks ago.     Past Medical History  Diagnosis Date  . Hypertension   . Diabetes mellitus   . Bipolar 1 disorder   . Hyperlipidemia   . Atrial fibrillation   . COPD (chronic obstructive pulmonary disease)   . Obstructive sleep apnea   . Chronic diastolic heart failure     Echocardiogram 05/14/11: Mild LVH, EF 50-55%, no wall motion abnormalities, grade 2 diastolic dysfunction.  . Chest pain     Myoview 05/17/11 demonstrated an EF of 61%, small fixed inferoseptal defect consistent with thinning vs prior infarct; no ischemia. - low risk  . Diabetic neuropathy   . Chronic back pain   . History of stroke   . Mental disorder     bipolar  . CHF (congestive heart failure)   . Stroke   . Neuromuscular  disorder     diabetic neuropathy  . Chronic respiratory failure with hypoxia 08/08/2012  . Noncompliance with medication regimen   . Tobacco abuse    Past Surgical History  Procedure Laterality Date  . No past surgeries      History  Substance Use Topics  . Smoking status: Current Every Day Smoker -- 1.50 packs/day for 29 years    Types: Cigarettes  . Smokeless tobacco: Never Used  . Alcohol Use: No    Review of Systems  Unable to perform ROS: Mental status change  Respiratory: Positive for shortness of breath.     Allergies  Flexeril and Muscle relief  Home Medications   Current Outpatient Rx  Name  Route  Sig  Dispense  Refill  . Aclidinium Bromide (TUDORZA PRESSAIR) 400 MCG/ACT AEPB   Inhalation   Inhale 1 puff into the lungs daily.         Marland Kitchen albuterol (PROVENTIL HFA;VENTOLIN HFA) 108 (90 BASE) MCG/ACT inhaler   Inhalation   Inhale 2 puffs into the lungs every 6 (six) hours as needed. For shortness of breath         . albuterol (PROVENTIL) (2.5 MG/3ML) 0.083% nebulizer solution   Nebulization   Take 2.5 mg by nebulization 3 (three) times daily as needed. For shortness of breath         . aspirin EC 81  MG tablet   Oral   Take 81 mg by mouth daily.         . budesonide-formoterol (SYMBICORT) 160-4.5 MCG/ACT inhaler   Inhalation   Inhale 2 puffs into the lungs 2 (two) times daily.         . clonazePAM (KLONOPIN) 0.5 MG tablet   Oral   Take 0.5 mg by mouth 2 (two) times daily.         . cloNIDine (CATAPRES) 0.2 MG tablet   Oral   Take 1 tablet (0.2 mg total) by mouth at bedtime.         . diazepam (VALIUM) 10 MG tablet   Oral   Take 1 tablet by mouth 2 (two) times daily as needed for anxiety or sleep.          Marland Kitchen diltiazem (CARDIZEM SR) 120 MG 12 hr capsule   Oral   Take 1 capsule (120 mg total) by mouth every 12 (twelve) hours. NEW MEDICATION TO CONTROL YOUR HEART RATE.   60 capsule   2   . furosemide (LASIX) 40 MG tablet   Oral    Take 80 mg by mouth daily.         Marland Kitchen gabapentin (NEURONTIN) 600 MG tablet   Oral   Take 600 mg by mouth 3 (three) times daily.         Marland Kitchen glimepiride (AMARYL) 2 MG tablet   Oral   Take 2 mg by mouth daily before breakfast.         . HYDROcodone-acetaminophen (NORCO) 10-325 MG per tablet   Oral   Take 1 tablet by mouth every 4 (four) hours as needed. For pain         . lisinopril (PRINIVIL,ZESTRIL) 40 MG tablet   Oral   Take 0.5 tablets (20 mg total) by mouth daily.         Marland Kitchen lithium carbonate (LITHOBID) 300 MG CR tablet   Oral   Take 300 mg by mouth 2 (two) times daily.         . nitroGLYCERIN (NITROSTAT) 0.4 MG SL tablet   Sublingual   Place 0.4 mg under the tongue every 5 (five) minutes as needed. For chest pain         . omeprazole (PRILOSEC) 20 MG capsule   Oral   Take 20 mg by mouth daily.         . ondansetron (ZOFRAN ODT) 8 MG disintegrating tablet   Oral   Take 1 tablet (8 mg total) by mouth every 8 (eight) hours as needed for nausea.   10 tablet   0   . potassium chloride (K-DUR) 10 MEQ tablet   Oral   Take 20 mEq by mouth daily.         . propranolol ER (INDERAL LA) 60 MG 24 hr capsule   Oral   Take 60 mg by mouth daily.         . QUEtiapine (SEROQUEL) 300 MG tablet   Oral   Take 150 mg by mouth at bedtime.         . simvastatin (ZOCOR) 80 MG tablet   Oral   Take 80 mg by mouth at bedtime.         Marland Kitchen warfarin (COUMADIN) 5 MG tablet   Oral   Take 2.5-5 mg by mouth daily. Take 5mg  on day 1, 2.5mg  on day 2, 5mg  on day 3, and continue accordingly  BP 112/75  Pulse 81  Temp(Src) 98.2 F (36.8 C) (Oral)  Resp 22  SpO2 94% Filed Vitals:   11/17/12 2200 11/17/12 2207 11/17/12 2300 11/18/12 0005  BP: 112/50  106/67 112/75  Pulse: 85 69 72 81  Temp:      TempSrc:      Resp:  18 19 22   SpO2: 88% 89% 73% 94%    Physical Exam 2005: Physical examination:  Nursing notes reviewed; Vital signs and O2 SAT reviewed;   Constitutional: Well developed, Well nourished, In no acute distress; Head:  Normocephalic, atraumatic; Eyes: EOMI, PERRL, No scleral icterus; ENMT: Mouth and pharynx normal, Mucous membranes dry; Neck: Supple, Full range of motion, No lymphadenopathy; Cardiovascular: Irregular irregular rate and rhythm, No gallop; Respiratory: Breath sounds diminished & equal bilaterally, scattered faint wheezes. Speaking in phrases. Normal respiratory effort/excursion; Chest: Nontender, Movement normal; Abdomen: Soft, Nontender, Nondistended, Normal bowel sounds; Genitourinary: No CVA tenderness; Extremities: Pulses normal, No tenderness, +1 pedal edema bilat without calf asymmetry.; Neuro: Lethargic, but opens eyes and talks to ED staff when his name is called. No facial droop. Speech clear. Moves all ext on stretcher spontaneously without apparent gross focal motor deficits.; Skin: Color normal, Warm, Dry.   ED Course  Procedures   2010:  Sats 80's on arrival on NRB O2, SBP 80's. Pt has been evaluated/admitted 2 weeks ago for similar symptoms. Will dose IV judicious IVF, IV solumedrol and start continuous neb now.   2100:  SBP 123 after IVF. Sats 92-95% on continuous neb. Will continue to monitor.   2145:  Pt lethargic, but easily arousable to his name. Talks with ED staff in full sentences. While awake/alert, Sats improve to high 80's-90's on N/C O2 and drop into low 80's when goes back to sleep.   2200:  Pt placed on bipap, Sats 89%. No tachypnea or tachycardia. Pt began to pull bipap off, stating he "wants to go home now." Pt told he needed to be admitted to the hospital for his low O2 Sats. Pt then stated he "is always like that" and "wants to go home." ED RN and myself had further d/w pt, who agrees to stay for admission. Pt fell asleep again.   2315:  ABG improving on Bipap.  T/C to [facility] Dr. Sharl Ma, case discussed, including:  HPI, pertinent PM/SHx, VS/PE, dx testing, ED course and treatment:  Agreeable to  admit, requests to write temporary orders, obtain stepdown bed to team 2.  0015:  IV lasix given. Sats improving to 94-95% on bipap. BP and HR remain stable. Pt continues to awaken to his name, talk with ED staff then fall asleep again.    MDM  MDM Reviewed: previous chart, nursing note and vitals Reviewed previous: labs, ECG, x-ray and CT scan Interpretation: labs, ECG and x-ray Total time providing critical care: 30-74 minutes. This excludes time spent performing separately reportable procedures and services. Consults: admitting MD    CRITICAL CARE Performed by: Laray Anger Total critical care time: 65 Critical care time was exclusive of separately billable procedures and treating other patients. Critical care was necessary to treat or prevent imminent or life-threatening deterioration. Critical care was time spent personally by me on the following activities: development of treatment plan with patient and/or surrogate as well as nursing, discussions with consultants, evaluation of patient's response to treatment, examination of patient, obtaining history from patient or surrogate, ordering and performing treatments and interventions, ordering and review of laboratory studies, ordering and review of radiographic studies,  pulse oximetry and re-evaluation of patient's condition.     Date: 11/17/2012  Rate: 61  Rhythm: atrial fibrillation  QRS Axis: normal  Intervals: normal  ST/T Wave abnormalities: nonspecific ST/T changes  Conduction Disutrbances:nonspecific intraventricular conduction delay  Narrative Interpretation:   Old EKG Reviewed: unchanged; no significant changes from previous EKG dated 10/31/2012.  Results for orders placed during the hospital encounter of 11/17/12  CBC WITH DIFFERENTIAL      Result Value Range   WBC 9.2  4.0 - 10.5 K/uL   RBC 3.64 (*) 4.22 - 5.81 MIL/uL   Hemoglobin 11.8 (*) 13.0 - 17.0 g/dL   HCT 96.0 (*) 45.4 - 09.8 %   MCV 106.6 (*) 78.0 -  100.0 fL   MCH 32.4  26.0 - 34.0 pg   MCHC 30.4  30.0 - 36.0 g/dL   RDW 11.9  14.7 - 82.9 %   Platelets 214  150 - 400 K/uL   Neutrophils Relative % 73  43 - 77 %   Lymphocytes Relative 20  12 - 46 %   Monocytes Relative 6  3 - 12 %   Eosinophils Relative 1  0 - 5 %   Basophils Relative 0  0 - 1 %   Neutro Abs 6.7  1.7 - 7.7 K/uL   Lymphs Abs 1.8  0.7 - 4.0 K/uL   Monocytes Absolute 0.6  0.1 - 1.0 K/uL   Eosinophils Absolute 0.1  0.0 - 0.7 K/uL   Basophils Absolute 0.0  0.0 - 0.1 K/uL   RBC Morphology BASOPHILIC STIPPLING     WBC Morphology ATYPICAL LYMPHOCYTES    BASIC METABOLIC PANEL      Result Value Range   Sodium 145  135 - 145 mEq/L   Potassium 5.2 (*) 3.5 - 5.1 mEq/L   Chloride 106  96 - 112 mEq/L   CO2 38 (*) 19 - 32 mEq/L   Glucose, Bld 115 (*) 70 - 99 mg/dL   BUN 16  6 - 23 mg/dL   Creatinine, Ser 5.62  0.50 - 1.35 mg/dL   Calcium 8.7  8.4 - 13.0 mg/dL   GFR calc non Af Amer 60 (*) >90 mL/min   GFR calc Af Amer 70 (*) >90 mL/min  PRO B NATRIURETIC PEPTIDE      Result Value Range   Pro B Natriuretic peptide (BNP) 1300.0 (*) 0 - 125 pg/mL  TROPONIN I      Result Value Range   Troponin I <0.30  <0.30 ng/mL  URINE RAPID DRUG SCREEN (HOSP PERFORMED)      Result Value Range   Opiates POSITIVE (*) NONE DETECTED   Cocaine NONE DETECTED  NONE DETECTED   Benzodiazepines POSITIVE (*) NONE DETECTED   Amphetamines NONE DETECTED  NONE DETECTED   Tetrahydrocannabinol NONE DETECTED  NONE DETECTED   Barbiturates NONE DETECTED  NONE DETECTED  URINALYSIS W MICROSCOPIC + REFLEX CULTURE      Result Value Range   Color, Urine YELLOW  YELLOW   APPearance HAZY (*) CLEAR   Specific Gravity, Urine 1.020  1.005 - 1.030   pH 5.5  5.0 - 8.0   Glucose, UA NEGATIVE  NEGATIVE mg/dL   Hgb urine dipstick NEGATIVE  NEGATIVE   Bilirubin Urine NEGATIVE  NEGATIVE   Ketones, ur NEGATIVE  NEGATIVE mg/dL   Protein, ur NEGATIVE  NEGATIVE mg/dL   Urobilinogen, UA 0.2  0.0 - 1.0 mg/dL   Nitrite  NEGATIVE  NEGATIVE   Leukocytes, UA NEGATIVE  NEGATIVE   Bacteria, UA FEW (*) RARE   Squamous Epithelial / LPF FEW (*) RARE   Casts GRANULAR CAST (*) NEGATIVE  ETHANOL      Result Value Range   Alcohol, Ethyl (B) <11  0 - 11 mg/dL  PROTIME-INR      Result Value Range   Prothrombin Time 16.6 (*) 11.6 - 15.2 seconds   INR 1.38  0.00 - 1.49  LITHIUM LEVEL      Result Value Range   Lithium Lvl <0.25 (*) 0.80 - 1.40 mEq/L  HEPATIC FUNCTION PANEL      Result Value Range   Total Protein 6.5  6.0 - 8.3 g/dL   Albumin 2.9 (*) 3.5 - 5.2 g/dL   AST 7  0 - 37 U/L   ALT 6  0 - 53 U/L   Alkaline Phosphatase 110  39 - 117 U/L   Total Bilirubin <0.1 (*) 0.3 - 1.2 mg/dL   Bilirubin, Direct <1.6  0.0 - 0.3 mg/dL   Indirect Bilirubin NOT CALCULATED  0.3 - 0.9 mg/dL  SALICYLATE LEVEL      Result Value Range   Salicylate Lvl <2.0 (*) 2.8 - 20.0 mg/dL  ACETAMINOPHEN LEVEL      Result Value Range   Acetaminophen (Tylenol), Serum <15.0  10 - 30 ug/mL  BLOOD GAS, ARTERIAL      Result Value Range   FIO2 1.00     O2 Content PENDING     Delivery systems NON-REBREATHER OXYGEN MASK     pH, Arterial 7.134 (*) 7.350 - 7.450   pCO2 arterial 112.0 (*) 35.0 - 45.0 mmHg   pO2, Arterial 85.9  80.0 - 100.0 mmHg   Bicarbonate 36.1 (*) 20.0 - 24.0 mEq/L   Acid-Base Excess 7.2 (*) 0.0 - 2.0 mmol/L   O2 Saturation 94.9     Patient temperature 37.0     Collection site RIGHT RADIAL     Drawn by [id number]     Sample type ARTERIAL DRAW     Allens test (pass/fail) PASS  PASS  BLOOD GAS, ARTERIAL      Result Value Range   FIO2 40.00     Delivery systems BILEVEL POSITIVE AIRWAY PRESSURE     Inspiratory PAP 18.0     Expiratory PAP 9.0     pH, Arterial 7.254 (*) 7.350 - 7.450   pCO2 arterial 81.8 (*) 35.0 - 45.0 mmHg   pO2, Arterial 56.9 (*) 80.0 - 100.0 mmHg   Bicarbonate 35.0 (*) 20.0 - 24.0 mEq/L   TCO2 31.7  0 - 100 mmol/L   Acid-Base Excess 8.0 (*) 0.0 - 2.0 mmol/L   O2 Saturation 88.2     Patient  temperature 37.0     Collection site RIGHT RADIAL     Drawn by [id number]     Sample type ARTERIAL     Allens test (pass/fail) PASS  PASS   Dg Chest Portable 1 View 11/17/2012   *RADIOLOGY REPORT*  Clinical Data: Shortness of breath.  Drug overdose.  PORTABLE CHEST - 1 VIEW  Comparison: Chest radiograph 10/31/2012  Findings: Cardiopericardial silhouette appears prominent for portable technique, and is stable.  There is diffuse pulmonary vascular congestion and diffuse interstitial prominence.   There are bibasilar opacities, left greater than right.  Left pleural effusion cannot be excluded.  No significant change in aeration of the lungs compared to 10/31/2012.  IMPRESSION: Similar appearance of the chest compared to 10/31/2012. Similar congestive heart failure pattern.  Bibasilar opacities,  left greater than right, could reflect a combination of atelectasis and possibly pleural effusion.  Airspace disease secondary to aspiration or pneumonia cannot be excluded at the lung bases by chest radiograph.   Original Report Authenticated By: Britta Mccreedy, M.D.    Results for PRESLEY, SUMMERLIN (MRN [id number]) as of 11/18/2012 00:36  Ref. Range 08/08/2012 15:20 08/28/2012 07:55 10/31/2012 13:55 11/17/2012 20:32  Pro B Natriuretic peptide (BNP) Latest Range: 0-125 pg/mL 1061.0 (H) 1356.0 (H) 1622.0 (H) 1300.0 (H)    Results for ARAMIS, WEIL (MRN [id number]) as of 11/18/2012 00:36  Ref. Range 10/12/2012 17:13 10/31/2012 13:55 11/01/2012 02:30 11/17/2012 20:32  Hemoglobin Latest Range: 13.0-17.0 g/dL 78.2 (L) 95.6 (L) 21.3 (L) 11.8 (L)  HCT Latest Range: 39.0-52.0 % 36.0 (L) 35.2 (L) 35.1 (L) 38.8 (L)      Laray Anger, DO 11/19/12 1907

## 2012-11-17 NOTE — ED Notes (Signed)
Pt removed from NRB & placed of 4l O2 by nasal canula, pt O2 sats dropped to 82% pt placed back on NRB. Pt sounds like he has upper airway congestion. Pt complaining of back pain & wanting to be moved up. Pt moved up in bed & pt sitting straight up. pt states he always has back pain.

## 2012-11-17 NOTE — ED Notes (Signed)
Pt had bowel movement. Pt cleaned & new sheets applied.

## 2012-11-17 NOTE — ED Notes (Signed)
Pt placed on bedpan at this time.

## 2012-11-18 ENCOUNTER — Encounter (HOSPITAL_COMMUNITY): Payer: Self-pay | Admitting: *Deleted

## 2012-11-18 DIAGNOSIS — J9620 Acute and chronic respiratory failure, unspecified whether with hypoxia or hypercapnia: Secondary | ICD-10-CM | POA: Diagnosis present

## 2012-11-18 DIAGNOSIS — I5033 Acute on chronic diastolic (congestive) heart failure: Secondary | ICD-10-CM

## 2012-11-18 DIAGNOSIS — R0609 Other forms of dyspnea: Secondary | ICD-10-CM

## 2012-11-18 DIAGNOSIS — J441 Chronic obstructive pulmonary disease with (acute) exacerbation: Secondary | ICD-10-CM

## 2012-11-18 DIAGNOSIS — I509 Heart failure, unspecified: Secondary | ICD-10-CM

## 2012-11-18 LAB — BLOOD GAS, ARTERIAL
Acid-Base Excess: 8.5 mmol/L — ABNORMAL HIGH (ref 0.0–2.0)
Delivery systems: POSITIVE
FIO2: 60 %
Inspiratory PAP: 18
Patient temperature: 37
pCO2 arterial: 56.9 mmHg — ABNORMAL HIGH (ref 35.0–45.0)
pH, Arterial: 7.39 (ref 7.350–7.450)

## 2012-11-18 LAB — COMPREHENSIVE METABOLIC PANEL
AST: 6 U/L (ref 0–37)
Albumin: 2.9 g/dL — ABNORMAL LOW (ref 3.5–5.2)
BUN: 21 mg/dL (ref 6–23)
CO2: 36 mEq/L — ABNORMAL HIGH (ref 19–32)
Chloride: 101 mEq/L (ref 96–112)
GFR calc non Af Amer: 56 mL/min — ABNORMAL LOW (ref >90)
Glucose, Bld: 236 mg/dL — ABNORMAL HIGH (ref 70–99)
Potassium: 4.8 mEq/L (ref 3.5–5.1)
Sodium: 142 mEq/L (ref 135–145)

## 2012-11-18 LAB — CBC
HCT: 37.8 % — ABNORMAL LOW (ref 39.0–52.0)
Hemoglobin: 11.9 g/dL — ABNORMAL LOW (ref 13.0–17.0)
MCHC: 31.5 g/dL (ref 30.0–36.0)
RBC: 3.67 MIL/uL — ABNORMAL LOW (ref 4.22–5.81)
WBC: 6.7 10*3/uL (ref 4.0–10.5)

## 2012-11-18 LAB — GLUCOSE, CAPILLARY
Glucose-Capillary: 220 mg/dL — ABNORMAL HIGH (ref 70–99)
Glucose-Capillary: 220 mg/dL — ABNORMAL HIGH (ref 70–99)
Glucose-Capillary: 238 mg/dL — ABNORMAL HIGH (ref 70–99)
Glucose-Capillary: 188 mg/dL — ABNORMAL HIGH (ref 70–99)

## 2012-11-18 MED ORDER — ALBUTEROL SULFATE (5 MG/ML) 0.5% IN NEBU: 2.5000 mg | INHALATION_SOLUTION | RESPIRATORY_TRACT | Status: DC | PRN

## 2012-11-18 MED ORDER — QUETIAPINE FUMARATE 25 MG PO TABS
ORAL_TABLET | ORAL | Status: AC
  Filled 2012-11-18: qty 2

## 2012-11-18 MED ORDER — INSULIN ASPART 100 UNIT/ML ~~LOC~~ SOLN
0.0000 [IU] | Freq: Every day | SUBCUTANEOUS | Status: DC
  Administered 2012-11-18: 2 [IU] via SUBCUTANEOUS

## 2012-11-18 MED ORDER — ONDANSETRON HCL 4 MG PO TABS: 4.0000 mg | ORAL_TABLET | Freq: Four times a day (QID) | ORAL | Status: DC | PRN

## 2012-11-18 MED ORDER — SODIUM CHLORIDE 0.9 % IV SOLN
250.0000 mL | INTRAVENOUS | Status: DC | PRN
  Administered 2012-11-18: 250 mL via INTRAVENOUS

## 2012-11-18 MED ORDER — LORAZEPAM 2 MG/ML IJ SOLN
INTRAMUSCULAR | Status: AC
  Administered 2012-11-18: 1 mg via INTRAVENOUS
  Filled 2012-11-18: qty 1

## 2012-11-18 MED ORDER — ALBUTEROL SULFATE (5 MG/ML) 0.5% IN NEBU
2.5000 mg | INHALATION_SOLUTION | Freq: Four times a day (QID) | RESPIRATORY_TRACT | Status: DC
  Administered 2012-11-18 – 2012-11-19 (×5): 2.5 mg via RESPIRATORY_TRACT
  Filled 2012-11-18 (×4): qty 0.5

## 2012-11-18 MED ORDER — FUROSEMIDE 10 MG/ML IJ SOLN
40.0000 mg | Freq: Once | INTRAMUSCULAR | Status: AC
  Administered 2012-11-18: 40 mg via INTRAVENOUS
  Filled 2012-11-18: qty 4

## 2012-11-18 MED ORDER — QUETIAPINE FUMARATE 50 MG PO TABS
150.0000 mg | ORAL_TABLET | Freq: Every day | ORAL | Status: DC
  Administered 2012-11-18: 150 mg via ORAL
  Filled 2012-11-18: qty 1

## 2012-11-18 MED ORDER — QUETIAPINE FUMARATE 100 MG PO TABS
ORAL_TABLET | ORAL | Status: AC
  Filled 2012-11-18: qty 1

## 2012-11-18 MED ORDER — FUROSEMIDE 10 MG/ML IJ SOLN
INTRAMUSCULAR | Status: AC
  Filled 2012-11-18: qty 4

## 2012-11-18 MED ORDER — SODIUM CHLORIDE 0.9 % IV SOLN
INTRAVENOUS | Status: AC
  Administered 2012-11-18: 02:00:00 via INTRAVENOUS

## 2012-11-18 MED ORDER — FUROSEMIDE 10 MG/ML IJ SOLN
40.0000 mg | Freq: Once | INTRAMUSCULAR | Status: AC
  Administered 2012-11-18: 40 mg via INTRAVENOUS

## 2012-11-18 MED ORDER — LORAZEPAM 0.5 MG PO TABS: 1.0000 mg | ORAL_TABLET | ORAL | Status: DC | PRN

## 2012-11-18 MED ORDER — LITHIUM CARBONATE ER 300 MG PO TBCR
300.0000 mg | EXTENDED_RELEASE_TABLET | Freq: Two times a day (BID) | ORAL | Status: DC
  Administered 2012-11-18: 300 mg via ORAL
  Filled 2012-11-18 (×6): qty 1

## 2012-11-18 MED ORDER — ONDANSETRON HCL 4 MG/2ML IJ SOLN: 4.0000 mg | Freq: Four times a day (QID) | INTRAMUSCULAR | Status: DC | PRN

## 2012-11-18 MED ORDER — METHYLPREDNISOLONE SODIUM SUCC 125 MG IJ SOLR
60.0000 mg | Freq: Four times a day (QID) | INTRAMUSCULAR | Status: DC
  Administered 2012-11-18 – 2012-11-19 (×5): 60 mg via INTRAVENOUS
  Filled 2012-11-18 (×5): qty 2

## 2012-11-18 MED ORDER — ONDANSETRON HCL 4 MG/2ML IJ SOLN: 4.0000 mg | Freq: Three times a day (TID) | INTRAMUSCULAR | Status: AC | PRN

## 2012-11-18 MED ORDER — SODIUM CHLORIDE 0.9 % IJ SOLN: 3.0000 mL | INTRAMUSCULAR | Status: DC | PRN

## 2012-11-18 MED ORDER — INSULIN ASPART 100 UNIT/ML ~~LOC~~ SOLN
0.0000 [IU] | Freq: Three times a day (TID) | SUBCUTANEOUS | Status: DC
  Administered 2012-11-18: 5 [IU] via SUBCUTANEOUS
  Administered 2012-11-18: 3 [IU] via SUBCUTANEOUS

## 2012-11-18 MED ORDER — SODIUM CHLORIDE 0.9 % IJ SOLN
3.0000 mL | Freq: Two times a day (BID) | INTRAMUSCULAR | Status: DC
  Administered 2012-11-18 (×3): 3 mL via INTRAVENOUS

## 2012-11-18 MED ORDER — LORAZEPAM 2 MG/ML IJ SOLN
1.0000 mg | INTRAMUSCULAR | Status: DC | PRN
  Administered 2012-11-18 (×2): 1 mg via INTRAVENOUS
  Filled 2012-11-18: qty 1

## 2012-11-18 MED ORDER — WARFARIN SODIUM 7.5 MG PO TABS
7.5000 mg | ORAL_TABLET | Freq: Once | ORAL | Status: AC
  Administered 2012-11-18: 7.5 mg via ORAL
  Filled 2012-11-18: qty 1

## 2012-11-18 MED ORDER — WARFARIN - PHARMACIST DOSING INPATIENT: Status: DC

## 2012-11-18 MED ORDER — IPRATROPIUM BROMIDE 0.02 % IN SOLN
0.5000 mg | Freq: Four times a day (QID) | RESPIRATORY_TRACT | Status: DC
  Administered 2012-11-18 – 2012-11-19 (×5): 0.5 mg via RESPIRATORY_TRACT
  Filled 2012-11-18 (×4): qty 2.5

## 2012-11-18 MED ORDER — ALBUTEROL SULFATE (5 MG/ML) 0.5% IN NEBU: 2.5000 mg | INHALATION_SOLUTION | RESPIRATORY_TRACT | Status: DC

## 2012-11-18 MED ORDER — INSULIN ASPART 100 UNIT/ML ~~LOC~~ SOLN
0.0000 [IU] | SUBCUTANEOUS | Status: DC
  Administered 2012-11-18: 3 [IU] via SUBCUTANEOUS
  Administered 2012-11-18 – 2012-11-19 (×2): 5 [IU] via SUBCUTANEOUS
  Administered 2012-11-19 (×2): 3 [IU] via SUBCUTANEOUS

## 2012-11-18 MED ORDER — HYDRALAZINE HCL 20 MG/ML IJ SOLN: 10.0000 mg | Freq: Four times a day (QID) | INTRAMUSCULAR | Status: DC | PRN

## 2012-11-18 NOTE — Progress Notes (Signed)
Pt transported up to ICCU 10 on BIPAP with no complication.

## 2012-11-18 NOTE — H&P (Addendum)
PCP:   [name], MD   Chief Complaint:  Shortness of breath  HPI: 57 year old male with a history of hypertension, A. fib, COPD, chronic diastolic heart failure, obstructive sleep apnea who was brought to the hospital due to worsening of shortness of breath. Patient was in acute respiratory distress and altered mental status and is currently on BiPAP. Patient is alert but agitated and is unable to provide any meaningful history. History obtained from the ER notes and also called patient's roommate Dina Rich, as per roommate patient has been having worsening of shortness of breath. Also there was concern that he was taking too much Klonopin and hydrocodone and has been talking of breath inflammation more lately. Patient has chronic diastolic heart failure and is currently taking Lasix at home. In the previous admission patient required IV Lasix for acute on chronic diastolic heart failure. As per roommate there was no chest pain, no nausea vomiting or diarrhea. Rest of review of systems is unobtainable at this time.  Allergies:   Allergies  Allergen Reactions  . Flexeril [Cyclobenzaprine] Other (See Comments)    Alters Mental Status-ALL MUSCLE RELAXERS  . Muscle Relief [Capsaicin]     Hysteria       Past Medical History  Diagnosis Date  . Hypertension   . Diabetes mellitus   . Bipolar 1 disorder   . Hyperlipidemia   . Atrial fibrillation   . COPD (chronic obstructive pulmonary disease)   . Obstructive sleep apnea   . Chronic diastolic heart failure     Echocardiogram 05/14/11: Mild LVH, EF 50-55%, no wall motion abnormalities, grade 2 diastolic dysfunction.  . Chest pain     Myoview 05/17/11 demonstrated an EF of 61%, small fixed inferoseptal defect consistent with thinning vs prior infarct; no ischemia. - low risk  . Diabetic neuropathy   . Chronic back pain   . History of stroke   . Mental disorder     bipolar  . CHF (congestive heart failure)   . Stroke   . Neuromuscular  disorder     diabetic neuropathy  . Chronic respiratory failure with hypoxia 08/08/2012  . Noncompliance with medication regimen   . Tobacco abuse     Past Surgical History  Procedure Laterality Date  . No past surgeries      Prior to Admission medications   Medication Sig Start Date End Date Taking? Authorizing Provider  Aclidinium Bromide (TUDORZA PRESSAIR) 400 MCG/ACT AEPB Inhale 1 puff into the lungs daily.    Historical Provider, MD  albuterol (PROVENTIL HFA;VENTOLIN HFA) 108 (90 BASE) MCG/ACT inhaler Inhale 2 puffs into the lungs every 6 (six) hours as needed. For shortness of breath    Historical Provider, MD  albuterol (PROVENTIL) (2.5 MG/3ML) 0.083% nebulizer solution Take 2.5 mg by nebulization 3 (three) times daily as needed. For shortness of breath    Historical Provider, MD  aspirin EC 81 MG tablet Take 81 mg by mouth daily.    Historical Provider, MD  budesonide-formoterol (SYMBICORT) 160-4.5 MCG/ACT inhaler Inhale 2 puffs into the lungs 2 (two) times daily.    Historical Provider, MD  clonazePAM (KLONOPIN) 0.5 MG tablet Take 0.5 mg by mouth 2 (two) times daily.    Historical Provider, MD  cloNIDine (CATAPRES) 0.2 MG tablet Take 1 tablet (0.2 mg total) by mouth at bedtime. 08/09/12   Elliot Cousin, MD  diazepam (VALIUM) 10 MG tablet Take 1 tablet by mouth 2 (two) times daily as needed for anxiety or sleep.  08/18/12  Historical Provider, MD  diltiazem (CARDIZEM SR) 120 MG 12 hr capsule Take 1 capsule (120 mg total) by mouth every 12 (twelve) hours. NEW MEDICATION TO CONTROL YOUR HEART RATE. 08/09/12   Elliot Cousin, MD  furosemide (LASIX) 40 MG tablet Take 80 mg by mouth daily.    Historical Provider, MD  gabapentin (NEURONTIN) 600 MG tablet Take 600 mg by mouth 3 (three) times daily.    Historical Provider, MD  glimepiride (AMARYL) 2 MG tablet Take 2 mg by mouth daily before breakfast.    Historical Provider, MD  HYDROcodone-acetaminophen (NORCO) 10-325 MG per tablet Take 1  tablet by mouth every 4 (four) hours as needed. For pain    Historical Provider, MD  lisinopril (PRINIVIL,ZESTRIL) 40 MG tablet Take 0.5 tablets (20 mg total) by mouth daily. 08/09/12   Elliot Cousin, MD  lithium carbonate (LITHOBID) 300 MG CR tablet Take 300 mg by mouth 2 (two) times daily.    Historical Provider, MD  nitroGLYCERIN (NITROSTAT) 0.4 MG SL tablet Place 0.4 mg under the tongue every 5 (five) minutes as needed. For chest pain    Historical Provider, MD  omeprazole (PRILOSEC) 20 MG capsule Take 20 mg by mouth daily.    Historical Provider, MD  ondansetron (ZOFRAN ODT) 8 MG disintegrating tablet Take 1 tablet (8 mg total) by mouth every 8 (eight) hours as needed for nausea. 10/12/12   [name], MD  potassium chloride (K-DUR) 10 MEQ tablet Take 20 mEq by mouth daily.    Historical Provider, MD  propranolol ER (INDERAL LA) 60 MG 24 hr capsule Take 60 mg by mouth daily.    Historical Provider, MD  QUEtiapine (SEROQUEL) 300 MG tablet Take 150 mg by mouth at bedtime.    Historical Provider, MD  simvastatin (ZOCOR) 80 MG tablet Take 80 mg by mouth at bedtime.    Historical Provider, MD  warfarin (COUMADIN) 5 MG tablet Take 2.5-5 mg by mouth daily. Take 5mg  on day 1, 2.5mg  on day 2, 5mg  on day 3, and continue accordingly    Historical Provider, MD    Social History:  reports that he has been smoking Cigarettes.  He has a 43.5 pack-year smoking history. He has never used smokeless tobacco. He reports that he does not drink alcohol or use illicit drugs.  No family history on file.   All the positives are listed in BOLD  Review of Systems:  Unobtainable    Physical Exam: Blood pressure 112/75, pulse 81, temperature 98.2 F (36.8 C), temperature source Oral, resp. rate 22, SpO2 94.00%. Constitutional:   Patient is a well-developed and well-nourished male in* in no acute distress and cooperative with exam. Head: Normocephalic and atraumatic Mouth: Mucus membranes moist Eyes: PERRL,  EOMI, conjunctivae normal Neck: Supple, No Thyromegaly Cardiovascular: RRR, S1 normal, S2 normal Pulmonary/Chest: decreased breath sounds bilaterally  Abdominal: Soft. Non-tender, non-distended, bowel sounds are normal, no masses, organomegaly, or guarding present.  Neurological: agitated, on BiPAP moving all extremities   Extremities : No Cyanosis, Clubbing or Edema   Labs on Admission:  Results for orders placed during the hospital encounter of 11/17/12 (from the past 48 hour(s))  HEPATIC FUNCTION PANEL     Status: Abnormal   Collection Time    11/17/12  8:14 PM      Result Value Range   Total Protein 6.5  6.0 - 8.3 g/dL   Albumin 2.9 (*) 3.5 - 5.2 g/dL   AST 7  0 - 37 U/L  ALT 6  0 - 53 U/L   Alkaline Phosphatase 110  39 - 117 U/L   Total Bilirubin <0.1 (*) 0.3 - 1.2 mg/dL   Bilirubin, Direct <1.6  0.0 - 0.3 mg/dL   Indirect Bilirubin NOT CALCULATED  0.3 - 0.9 mg/dL  SALICYLATE LEVEL     Status: Abnormal   Collection Time    11/17/12  8:14 PM      Result Value Range   Salicylate Lvl <2.0 (*) 2.8 - 20.0 mg/dL  ACETAMINOPHEN LEVEL     Status: None   Collection Time    11/17/12  8:14 PM      Result Value Range   Acetaminophen (Tylenol), Serum <15.0  10 - 30 ug/mL   Comment:            THERAPEUTIC CONCENTRATIONS VARY     SIGNIFICANTLY. A RANGE OF 10-30     ug/mL MAY BE AN EFFECTIVE     CONCENTRATION FOR MANY PATIENTS.     HOWEVER, SOME ARE BEST TREATED     AT CONCENTRATIONS OUTSIDE THIS     RANGE.     ACETAMINOPHEN CONCENTRATIONS     >150 ug/mL AT 4 HOURS AFTER     INGESTION AND >50 ug/mL AT 12     HOURS AFTER INGESTION ARE     OFTEN ASSOCIATED WITH TOXIC     REACTIONS.  URINE RAPID DRUG SCREEN (HOSP PERFORMED)     Status: Abnormal   Collection Time    11/17/12  8:20 PM      Result Value Range   Opiates POSITIVE (*) NONE DETECTED   Cocaine NONE DETECTED  NONE DETECTED   Benzodiazepines POSITIVE (*) NONE DETECTED   Amphetamines NONE DETECTED  NONE DETECTED    Tetrahydrocannabinol NONE DETECTED  NONE DETECTED   Barbiturates NONE DETECTED  NONE DETECTED   Comment:            DRUG SCREEN FOR MEDICAL PURPOSES     ONLY.  IF CONFIRMATION IS NEEDED     FOR ANY PURPOSE, NOTIFY LAB     WITHIN 5 DAYS.                LOWEST DETECTABLE LIMITS     FOR URINE DRUG SCREEN     Drug Class       Cutoff (ng/mL)     Amphetamine      1000     Barbiturate      200     Benzodiazepine   200     Tricyclics       300     Opiates          300     Cocaine          300     THC              50  URINALYSIS W MICROSCOPIC + REFLEX CULTURE     Status: Abnormal   Collection Time    11/17/12  8:20 PM      Result Value Range   Color, Urine YELLOW  YELLOW   APPearance HAZY (*) CLEAR   Specific Gravity, Urine 1.020  1.005 - 1.030   pH 5.5  5.0 - 8.0   Glucose, UA NEGATIVE  NEGATIVE mg/dL   Hgb urine dipstick NEGATIVE  NEGATIVE   Bilirubin Urine NEGATIVE  NEGATIVE   Ketones, ur NEGATIVE  NEGATIVE mg/dL   Protein, ur NEGATIVE  NEGATIVE mg/dL   Urobilinogen, UA 0.2  0.0 - 1.0 mg/dL   Nitrite NEGATIVE  NEGATIVE   Leukocytes, UA NEGATIVE  NEGATIVE   Bacteria, UA FEW (*) RARE   Squamous Epithelial / LPF FEW (*) RARE   Casts GRANULAR CAST (*) NEGATIVE  CBC WITH DIFFERENTIAL     Status: Abnormal   Collection Time    11/17/12  8:32 PM      Result Value Range   WBC 9.2  4.0 - 10.5 K/uL   RBC 3.64 (*) 4.22 - 5.81 MIL/uL   Hemoglobin 11.8 (*) 13.0 - 17.0 g/dL   HCT 16.1 (*) 09.6 - 04.5 %   MCV 106.6 (*) 78.0 - 100.0 fL   MCH 32.4  26.0 - 34.0 pg   MCHC 30.4  30.0 - 36.0 g/dL   RDW 40.9  81.1 - 91.4 %   Platelets 214  150 - 400 K/uL   Neutrophils Relative % 73  43 - 77 %   Lymphocytes Relative 20  12 - 46 %   Monocytes Relative 6  3 - 12 %   Eosinophils Relative 1  0 - 5 %   Basophils Relative 0  0 - 1 %   Neutro Abs 6.7  1.7 - 7.7 K/uL   Lymphs Abs 1.8  0.7 - 4.0 K/uL   Monocytes Absolute 0.6  0.1 - 1.0 K/uL   Eosinophils Absolute 0.1  0.0 - 0.7 K/uL   Basophils  Absolute 0.0  0.0 - 0.1 K/uL   RBC Morphology BASOPHILIC STIPPLING     Comment: STOMATOCYTES   WBC Morphology ATYPICAL LYMPHOCYTES    BASIC METABOLIC PANEL     Status: Abnormal   Collection Time    11/17/12  8:32 PM      Result Value Range   Sodium 145  135 - 145 mEq/L   Potassium 5.2 (*) 3.5 - 5.1 mEq/L   Chloride 106  96 - 112 mEq/L   CO2 38 (*) 19 - 32 mEq/L   Glucose, Bld 115 (*) 70 - 99 mg/dL   BUN 16  6 - 23 mg/dL   Creatinine, Ser 7.82  0.50 - 1.35 mg/dL   Calcium 8.7  8.4 - 95.6 mg/dL   GFR calc non Af Amer 60 (*) >90 mL/min   GFR calc Af Amer 70 (*) >90 mL/min   Comment: (NOTE)     The eGFR has been calculated using the CKD EPI equation.     This calculation has not been validated in all clinical situations.     eGFR's persistently <90 mL/min signify possible Chronic Kidney     Disease.  PRO B NATRIURETIC PEPTIDE     Status: Abnormal   Collection Time    11/17/12  8:32 PM      Result Value Range   Pro B Natriuretic peptide (BNP) 1300.0 (*) 0 - 125 pg/mL  TROPONIN I     Status: None   Collection Time    11/17/12  8:32 PM      Result Value Range   Troponin I <0.30  <0.30 ng/mL   Comment:            Due to the release kinetics of cTnI,     a negative result within the first hours     of the onset of symptoms does not rule out     myocardial infarction with certainty.     If myocardial infarction is still suspected,     repeat the test at appropriate intervals.  ETHANOL  Status: None   Collection Time    11/17/12  8:32 PM      Result Value Range   Alcohol, Ethyl (B) <11  0 - 11 mg/dL   Comment:            LOWEST DETECTABLE LIMIT FOR     SERUM ALCOHOL IS 11 mg/dL     FOR MEDICAL PURPOSES ONLY  PROTIME-INR     Status: Abnormal   Collection Time    11/17/12  8:32 PM      Result Value Range   Prothrombin Time 16.6 (*) 11.6 - 15.2 seconds   INR 1.38  0.00 - 1.49  LITHIUM LEVEL     Status: Abnormal   Collection Time    11/17/12  8:34 PM      Result Value  Range   Lithium Lvl <0.25 (*) 0.80 - 1.40 mEq/L  BLOOD GAS, ARTERIAL     Status: Abnormal (Preliminary result)   Collection Time    11/17/12  9:49 PM      Result Value Range   FIO2 1.00     O2 Content PENDING     Delivery systems NON-REBREATHER OXYGEN MASK     pH, Arterial 7.134 (*) 7.350 - 7.450   Comment: CRITICAL RESULT CALLED TO, READ BACK BY AND VERIFIED WITH:     KATHLEEN MCMANUS,DO BY SHERINE RRT,RCP ON 11/17/12 AT 2200   pCO2 arterial 112.0 (*) 35.0 - 45.0 mmHg   Comment: CRITICAL RESULT CALLED TO, READ BACK BY AND VERIFIED WITH:     KATHLEEN MCMANUS,DO BY SHERINE RRT,RCP ON 11/17/12 AT 2200   pO2, Arterial 85.9  80.0 - 100.0 mmHg   Bicarbonate 36.1 (*) 20.0 - 24.0 mEq/L   Acid-Base Excess 7.2 (*) 0.0 - 2.0 mmol/L   O2 Saturation 94.9     Patient temperature 37.0     Collection site RIGHT RADIAL     Drawn by [id number]     Sample type ARTERIAL DRAW     Allens test (pass/fail) PASS  PASS  BLOOD GAS, ARTERIAL     Status: Abnormal   Collection Time    11/17/12 11:05 PM      Result Value Range   FIO2 40.00     Delivery systems BILEVEL POSITIVE AIRWAY PRESSURE     Inspiratory PAP 18.0     Expiratory PAP 9.0     pH, Arterial 7.254 (*) 7.350 - 7.450   pCO2 arterial 81.8 (*) 35.0 - 45.0 mmHg   Comment: CRITICAL RESULT CALLED TO, READ BACK BY AND VERIFIED WITH:     HAYMORE R. RN AT 2316 11/17/12 ANDERSON S. RRT   pO2, Arterial 56.9 (*) 80.0 - 100.0 mmHg   Bicarbonate 35.0 (*) 20.0 - 24.0 mEq/L   TCO2 31.7  0 - 100 mmol/L   Acid-Base Excess 8.0 (*) 0.0 - 2.0 mmol/L   O2 Saturation 88.2     Patient temperature 37.0     Collection site RIGHT RADIAL     Drawn by [id number]     Sample type ARTERIAL     Allens test (pass/fail) PASS  PASS    Radiological Exams on Admission: Dg Chest Portable 1 View  11/17/2012   *RADIOLOGY REPORT*  Clinical Data: Shortness of breath.  Drug overdose.  PORTABLE CHEST - 1 VIEW  Comparison: Chest radiograph 10/31/2012  Findings: Cardiopericardial silhouette  appears prominent for portable technique, and is stable.  There is diffuse pulmonary vascular congestion and diffuse interstitial prominence.   There are  bibasilar opacities, left greater than right.  Left pleural effusion cannot be excluded.  No significant change in aeration of the lungs compared to 10/31/2012.  IMPRESSION: Similar appearance of the chest compared to 10/31/2012. Similar congestive heart failure pattern.  Bibasilar opacities, left greater than right, could reflect a combination of atelectasis and possibly pleural effusion.  Airspace disease secondary to aspiration or pneumonia cannot be excluded at the lung bases by chest radiograph.   Original Report Authenticated By: Britta Mccreedy, M.D.    Assessment/Plan Active Problems:   Tobacco abuse   DM (diabetes mellitus)   HTN (hypertension)   COPD with acute exacerbation   Chronic respiratory failure with hypoxia   Chronic diastolic heart failure   Atrial fibrillation with RVR   Acute-on-chronic respiratory failure  Acute on chronic respiratory failure  Patient has chronic respiratory failure which is multifactorial secondary to COPD, obstructive sleep apnea, chronic diastolic heart failure. At this time patient is on BiPAP and his sats are around high 80s to 90s. Which improved on movement and dropped to 80s and patient is sleeping. We'll start the patient on IV Solu-Medrol for COPD exacerbation. And also patient will be given one dose of Lasix 40 mg IV in the ED for possible acute diastolic heart failure. Patient's BNP is elevated to 1300. Chest x-ray shows similar appearance as compared to 8/15 similar congestive heart failure pattern. I do not suspect that patient has pneumonia as he is not febrile, and not hypotensive .his presentation is very similar to previous admission. He did receive Narcan in the ED with not much improvement Will continue patient on BiPAP, he may need to be intubated if unable to maintain his sats on  BiPAP  Atrial fibrillation Patient will continued on Coumadin per pharmacy consult At this time heart rate is well controlled  COPD exacerbation We'll start Solu-Medrol and scheduled DuoNeb nebulizers  Hypertension Patient her pressure is in low 100's We'll continue to monitor I will hold the Catapres and metoprolol at this time  Diabetes mellitus We'll start sliding scale insulin    Code status: full code  Family discussion:discussed with patient's roommate on phone    Time Spent on Admission: 60 min  Jannelly Bergren S [facility] Pager: [phone] 11/18/2012, 12:26 AM  If 7PM-7AM, please contact night-coverage  [email or web address]  Password [email or web address]

## 2012-11-18 NOTE — Progress Notes (Signed)
Pt is awake and agitated. Insists on leaving ama. Dr Elvera Lennox in to speak w/ pt twice. Pt is not coherant enough to make the decison to leave .

## 2012-11-18 NOTE — Progress Notes (Signed)
Inpatient Diabetes Program Recommendations  AACE/ADA: New Consensus Statement on Inpatient Glycemic Control (2013)  Target Ranges:  Prepandial:   less than 140 mg/dL      Peak postprandial:   less than 180 mg/dL (1-2 hours)      Critically ill patients:  140 - 180 mg/dL   Results for Sean Warren, Sean Warren (MRN [id number]) as of 11/18/2012 14:03  Ref. Range 11/18/2012 03:20 11/18/2012 07:43 11/18/2012 11:30  Glucose-Capillary Latest Range: 70-99 mg/dL 409 (H) 811 (H) 914 (H)    Inpatient Diabetes Program Recommendations Correction (SSI): Please consider increasing Novolog correction to resistant scale (if patient continues on steroids).   HgbA1C: Please consider ordering an A1C to determine glycemic control over the past 2-3 months.  Note: Patient has a history of diabetes and takes Amaryl 2 mg mg QAM at home for diabetes management.  Currently, patient is ordered to receive Novolog 0-15 units AC and Novolog 0-5 units HS for inpatient glycemic control.  Patient is ordered to receive Solumedrol 60 mg Q6H which is contributing to hyperglycemia.  Blood glucose over the past 12 hours has ranged from 178-220 mg/dl.  If steroids are continued, please consider increasing Novolog correction to resistant scale.  Also, please order an A1C (last A1C was 6.5% on 08/08/2012).  Will continue to follow.  Thanks, Orlando Penner, RN, MSN, CCRN Diabetes Coordinator Inpatient Diabetes Program [phone]

## 2012-11-18 NOTE — Progress Notes (Signed)
[facility] PROGRESS NOTE  MONIQUE GIFT UJW:[id number] DOB: Feb 22, 1956 DOA: 11/17/2012 PCP: [name], MD  HPI: 57 year old male with a history of hypertension, A. fib, COPD, chronic diastolic heart failure, obstructive sleep apnea who was brought to the hospital due to worsening of shortness of breath. Patient was in acute respiratory distress and altered mental status and is currently on BiPAP. Patient is alert but agitated and is unable to provide any meaningful history. History obtained from the ER notes and also called patient's roommate Dina Rich, as per roommate patient has been having worsening of shortness of breath. Also there was concern that he was taking too much Klonopin and hydrocodone and has been talking of breath inflammation more lately. Patient has chronic diastolic heart failure and is currently taking Lasix at home. In the previous admission patient required IV Lasix for acute on chronic diastolic heart failure. As per roommate there was no chest pain, no nausea vomiting or diarrhea. Rest of review of systems is unobtainable at this time.  Assessment/Plan: Acute on chronic respiratory failure - Patient has chronic respiratory failure which is multifactorial secondary to COPD, obstructive sleep apnea, chronic diastolic heart failure. Patient on BiPAP overnight, ABG improving nicely.  Atrial fibrillation - Patient will continued on Coumadin per pharmacy consult. At this time heart rate is well controlled  COPD exacerbation We'll start Solu-Medrol and scheduled DuoNeb nebulizers  Hypertension Patient her pressure is in low 100's. We'll continue to monitor. I will hold the Catapres and metoprolol at this time  Diabetes mellitus - We'll start sliding scale insulin  Code Status: Full Family Communication: none  Disposition Plan: inpatient  Consultants:  none  Procedures:  none  Anti-infectives   None     Antibiotics Given (last 72 hours)   None       HPI/Subjective: - sleeping with Bipap on, wakes up on voice, states no complaints  Objective: Filed Vitals:   11/18/12 0700 11/18/12 0803 11/18/12 0808 11/18/12 0830  BP: 139/71     Pulse: 90   97  Temp:  99.1 F (37.3 C)    TempSrc:  Axillary    Resp: 32   28  Height:      Weight:      SpO2: 90%  92% 96%    Intake/Output Summary (Last 24 hours) at 11/18/12 0910 Last data filed at 11/18/12 0849  Gross per 24 hour  Intake    500 ml  Output   1000 ml  Net   -500 ml   Filed Weights   11/18/12 0140 11/18/12 0500  Weight: 120 kg (264 lb 8.8 oz) 119.3 kg (263 lb 0.1 oz)    Exam:   General:  NAD on BiPAP  Cardiovascular: regular rate and rhythm, without MRG  Respiratory: coarse breath sounds.  Abdomen: soft, not tender to palpation, positive bowel sounds  MSK: no peripheral edema  Neuro: CN 2-12 grossly intact, MS 5/5 in all 4  Data Reviewed: Basic Metabolic Panel:  Recent Labs Lab 11/17/12 2032 11/18/12 0733  NA 145 142  K 5.2* 4.8  CL 106 101  CO2 38* 36*  GLUCOSE 115* 236*  BUN 16 21  CREATININE 1.27 1.35  CALCIUM 8.7 8.9   Liver Function Tests:  Recent Labs Lab 11/17/12 2014 11/18/12 0733  AST 7 6  ALT 6 5  ALKPHOS 110 116  BILITOT <0.1* 0.2*  PROT 6.5 6.1  ALBUMIN 2.9* 2.9*   CBC:  Recent Labs Lab 11/17/12 2032 11/18/12 0733  WBC  9.2 6.7  NEUTROABS 6.7  --   HGB 11.8* 11.9*  HCT 38.8* 37.8*  MCV 106.6* 103.0*  PLT 214 200   Cardiac Enzymes:  Recent Labs Lab 11/17/12 2032 11/18/12 0210 11/18/12 0727  TROPONINI <0.30 <0.30 <0.30   BNP (last 3 results)  Recent Labs  08/28/12 0755 10/31/12 1355 11/17/12 2032  PROBNP 1356.0* 1622.0* 1300.0*   CBG:  Recent Labs Lab 11/18/12 0320 11/18/12 0743  GLUCAP 220* 220*   Recent Results (from the past 240 hour(s))  MRSA PCR SCREENING     Status: None   Collection Time    11/18/12  1:58 AM      Result Value Range Status   MRSA by PCR NEGATIVE  NEGATIVE Final    Comment:            The GeneXpert MRSA Assay (FDA     approved for NASAL specimens     only), is one component of a     comprehensive MRSA colonization     surveillance program. It is not     intended to diagnose MRSA     infection nor to guide or     monitor treatment for     MRSA infections.     Studies: Dg Chest Portable 1 View  11/17/2012   *RADIOLOGY REPORT*  Clinical Data: Shortness of breath.  Drug overdose.  PORTABLE CHEST - 1 VIEW  Comparison: Chest radiograph 10/31/2012  Findings: Cardiopericardial silhouette appears prominent for portable technique, and is stable.  There is diffuse pulmonary vascular congestion and diffuse interstitial prominence.   There are bibasilar opacities, left greater than right.  Left pleural effusion cannot be excluded.  No significant change in aeration of the lungs compared to 10/31/2012.  IMPRESSION: Similar appearance of the chest compared to 10/31/2012. Similar congestive heart failure pattern.  Bibasilar opacities, left greater than right, could reflect a combination of atelectasis and possibly pleural effusion.  Airspace disease secondary to aspiration or pneumonia cannot be excluded at the lung bases by chest radiograph.   Original Report Authenticated By: Britta Mccreedy, M.D.    Scheduled Meds: . sodium chloride   Intravenous STAT  . albuterol  2.5 mg Nebulization Q6H  . insulin aspart  0-15 Units Subcutaneous TID WC  . insulin aspart  0-5 Units Subcutaneous QHS  . ipratropium  0.5 mg Nebulization Q6H  . methylPREDNISolone (SOLU-MEDROL) injection  60 mg Intravenous Q6H  . sodium chloride  3 mL Intravenous Q12H   Continuous Infusions:   Active Problems:   Tobacco abuse   DM (diabetes mellitus)   HTN (hypertension)   COPD with acute exacerbation   Chronic respiratory failure with hypoxia   Chronic diastolic heart failure   Atrial fibrillation with RVR   Acute-on-chronic respiratory failure   Time spent: 35  Pamella Pert, MD [facility] Pager [phone]. If 7 PM - 7 AM, please contact night-coverage at [email or web address], password [facility] 11/18/2012, 9:10 AM  LOS: 1 day

## 2012-11-18 NOTE — Progress Notes (Signed)
RCP tried to get patient to wear BIPAP for the night, but he wore it for 5 min and then wanted the mask off. RN notified.

## 2012-11-18 NOTE — Progress Notes (Signed)
UR chart review completed.  

## 2012-11-18 NOTE — Progress Notes (Signed)
Pt is very agitated and keep asking for drink and food. Md was notified. Pt received some ginger ale and crackers under RN supervision, sitting straight. Pt did not exhibit any signs of aspiration during snack time.

## 2012-11-18 NOTE — Progress Notes (Signed)
ANTICOAGULATION CONSULT NOTE - Initial Consult  Pharmacy Consult for Coumadin Indication: atrial fibrillation  Allergies  Allergen Reactions  . Flexeril [Cyclobenzaprine] Other (See Comments)    Alters Mental Status-ALL MUSCLE RELAXERS  . Muscle Relief [Capsaicin]     Hysteria     Patient Measurements: Height: 5\' 8"  (172.7 cm) Weight: 263 lb 0.1 oz (119.3 kg) IBW/kg (Calculated) : 68.4  Vital Signs: Temp: 99.1 F (37.3 C) (09/02 0803) Temp src: Axillary (09/02 0803) BP: 139/71 mmHg (09/02 0700) Pulse Rate: 97 (09/02 0830)  Labs:  Recent Labs  11/17/12 2032 11/18/12 0210 11/18/12 0727 11/18/12 0733  HGB 11.8*  --   --  11.9*  HCT 38.8*  --   --  37.8*  PLT 214  --   --  200  LABPROT 16.6*  --   --   --   INR 1.38  --   --   --   CREATININE 1.27  --   --  1.35  TROPONINI <0.30 <0.30 <0.30  --     Estimated Creatinine Clearance: 74 ml/min (by C-G formula based on Cr of 1.35).   Medical History: Past Medical History  Diagnosis Date  . Hypertension   . Diabetes mellitus   . Bipolar 1 disorder   . Hyperlipidemia   . Atrial fibrillation   . COPD (chronic obstructive pulmonary disease)   . Obstructive sleep apnea   . Chronic diastolic heart failure     Echocardiogram 05/14/11: Mild LVH, EF 50-55%, no wall motion abnormalities, grade 2 diastolic dysfunction.  . Chest pain     Myoview 05/17/11 demonstrated an EF of 61%, small fixed inferoseptal defect consistent with thinning vs prior infarct; no ischemia. - low risk  . Diabetic neuropathy   . Chronic back pain   . History of stroke   . Mental disorder     bipolar  . CHF (congestive heart failure)   . Stroke   . Neuromuscular disorder     diabetic neuropathy  . Chronic respiratory failure with hypoxia 08/08/2012  . Noncompliance with medication regimen   . Tobacco abuse     Medications:  Scheduled:  . albuterol  2.5 mg Nebulization Q6H  . insulin aspart  0-15 Units Subcutaneous TID WC  . insulin aspart   0-5 Units Subcutaneous QHS  . ipratropium  0.5 mg Nebulization Q6H  . methylPREDNISolone (SOLU-MEDROL) injection  60 mg Intravenous Q6H  . sodium chloride  3 mL Intravenous Q12H  . warfarin  7.5 mg Oral ONCE-1800  . [START ON 11/19/2012] Warfarin - Pharmacist Dosing Inpatient   Does not apply Q24H    Assessment: 57 yo M on chronic warfarin 5mg  alt 2.5mg  for hx Afib.  INR is sub-therapeutic on admission.  He is admitted with acute respiratory failure.  Have not been able to verify home med list/ home warfarin dose. No bleeding noted.   Goal of Therapy:  INR 2-3   Plan:  Coumadin 7.5mg  po x1 today Daily INR  Elson Clan 11/18/2012,2:15 PM

## 2012-11-18 NOTE — Plan of Care (Signed)
I was informed the patient is trying to leave AGAINST MEDICAL ADVICE. I discussed with the patient in the room, and at this moment patient is confused, beligerant, not oriented to time, and currently does not have capacity to make medical decisions.

## 2012-11-18 NOTE — ED Provider Notes (Signed)
Dr Elvera Lennox called d/t pt's extreme agitation and yelling to go home. Orders received.

## 2012-11-19 DIAGNOSIS — F319 Bipolar disorder, unspecified: Secondary | ICD-10-CM

## 2012-11-19 LAB — BASIC METABOLIC PANEL
BUN: 30 mg/dL — ABNORMAL HIGH (ref 6–23)
CO2: 37 mEq/L — ABNORMAL HIGH (ref 19–32)
Chloride: 101 mEq/L (ref 96–112)
Creatinine, Ser: 1.25 mg/dL (ref 0.50–1.35)
Glucose, Bld: 210 mg/dL — ABNORMAL HIGH (ref 70–99)
Potassium: 4.4 mEq/L (ref 3.5–5.1)

## 2012-11-19 LAB — GLUCOSE, CAPILLARY

## 2012-11-19 LAB — CBC
HCT: 36.1 % — ABNORMAL LOW (ref 39.0–52.0)
Hemoglobin: 11.4 g/dL — ABNORMAL LOW (ref 13.0–17.0)
MCV: 101.1 fL — ABNORMAL HIGH (ref 78.0–100.0)
RBC: 3.57 MIL/uL — ABNORMAL LOW (ref 4.22–5.81)
RDW: 13.3 % (ref 11.5–15.5)
WBC: 7.2 10*3/uL (ref 4.0–10.5)

## 2012-11-19 MED ORDER — QUETIAPINE FUMARATE 25 MG PO TABS
150.0000 mg | ORAL_TABLET | Freq: Every day | ORAL | Status: DC
  Filled 2012-11-19 (×2): qty 2

## 2012-11-19 NOTE — Progress Notes (Signed)
Pt discharged and educated regarding discharge instructions. Pt transferred to front entrance via wheelchair along with phone, and phone charger and personal hygiene items. Leaving with roommate via personal vehicle.

## 2012-11-19 NOTE — Discharge Summary (Signed)
Physician Discharge Summary  Sean Warren:[id number] DOB: 02/25/1956 DOA: 11/17/2012  PCP: [name], MD  Admit date: 11/17/2012 Discharge date: 11/19/2012  Time spent: Greater than 30 minutes  Recommendations for Outpatient Follow-up:  1. Follow up primary care physician in one week.   Discharge Diagnoses:  1. Acute on chronic diastolic congestive heart failure, improved. 2. COPD. 3. Chronic respiratory failure with hypoxemia secondary to #1 #2, improved. 4. Hypertension. 5. Type 2 diabetes mellitus. 6. Noncompliance.   Discharge Condition: Stable.  Diet recommendation: Carbohydrate modified diet.  Filed Weights   11/18/12 0140 11/18/12 0500 11/19/12 0500  Weight: 120 kg (264 lb 8.8 oz) 119.3 kg (263 lb 0.1 oz) 120.6 kg (265 lb 14 oz)    History of present illness:  This 57 year old man, who is known to the hospital, presented with symptoms of dyspnea. Please see initial history as outlined below: HPI:  57 year old male with a history of hypertension, A. fib, COPD, chronic diastolic heart failure, obstructive sleep apnea who was brought to the hospital due to worsening of shortness of breath. Patient was in acute respiratory distress and altered mental status and is currently on BiPAP. Patient is alert but agitated and is unable to provide any meaningful history. History obtained from the ER notes and also called patient's roommate Dina Rich, as per roommate patient has been having worsening of shortness of breath. Also there was concern that he was taking too much Klonopin and hydrocodone and has been talking of breath inflammation more lately. Patient has chronic diastolic heart failure and is currently taking Lasix at home. In the previous admission patient required IV Lasix for acute on chronic diastolic heart failure. As per roommate there was no chest pain, no nausea vomiting or diarrhea. Rest of review of systems is unobtainable at this time.  Hospital Course:   Patient was admitted and given intravenous Lasix and supportive treatment with intravenous steroids. He has improved significantly overnight. He did require BiPAP on admission but is improved overnight. He is very keen to go home. He does have a history of noncompliance with his medications and he admits to this even today. He is saturating at his baseline, requiring 3-4 L oxygen per nasal cannula which is what he has at home. He is stable for discharge today and will follow with his primary care physician soon. I have stressed the importance of tobacco cessation and also been compliant with the medications he is supposed to be taking.  Procedures:  None.   Consultations:  None.  Discharge Exam: Filed Vitals:   11/19/12 0400  BP:   Pulse:   Temp: 98 F (36.7 C)  Resp:     General: He looks chronically sick but not acutely unwell. There is no significant respiratory distress. Cardiovascular: Heart sounds are present and atrial fibrillation, rate control. Respiratory: Lung fields are actually clear with no wheezing or even crackles today. There is no bronchial breathing. He is alert and orientated. He is mentally competent to make his own decisions, in my opinion.  Discharge Instructions  Discharge Orders   Future Orders Complete By Expires   Diet - low sodium heart healthy  As directed    Increase activity slowly  As directed        Medication List         albuterol 108 (90 BASE) MCG/ACT inhaler  Commonly known as:  PROVENTIL HFA;VENTOLIN HFA  Inhale 2 puffs into the lungs every 6 (six) hours as needed. For shortness of breath  albuterol (2.5 MG/3ML) 0.083% nebulizer solution  Commonly known as:  PROVENTIL  Take 2.5 mg by nebulization 3 (three) times daily as needed. For shortness of breath     aspirin EC 81 MG tablet  Take 81 mg by mouth daily.     budesonide-formoterol 160-4.5 MCG/ACT inhaler  Commonly known as:  SYMBICORT  Inhale 2 puffs into the lungs 2 (two)  times daily.     clonazePAM 0.5 MG tablet  Commonly known as:  KLONOPIN  Take 0.5 mg by mouth 2 (two) times daily.     cloNIDine 0.2 MG tablet  Commonly known as:  CATAPRES  Take 1 tablet (0.2 mg total) by mouth at bedtime.     diazepam 10 MG tablet  Commonly known as:  VALIUM  Take 1 tablet by mouth 2 (two) times daily as needed for anxiety or sleep.     diltiazem 120 MG 12 hr capsule  Commonly known as:  CARDIZEM SR  Take 1 capsule (120 mg total) by mouth every 12 (twelve) hours. NEW MEDICATION TO CONTROL YOUR HEART RATE.     furosemide 40 MG tablet  Commonly known as:  LASIX  Take 80 mg by mouth daily.     gabapentin 600 MG tablet  Commonly known as:  NEURONTIN  Take 600 mg by mouth 3 (three) times daily.     glimepiride 2 MG tablet  Commonly known as:  AMARYL  Take 2 mg by mouth daily before breakfast.     HYDROcodone-acetaminophen 10-325 MG per tablet  Commonly known as:  NORCO  Take 1 tablet by mouth every 4 (four) hours as needed. For pain     lisinopril 40 MG tablet  Commonly known as:  PRINIVIL,ZESTRIL  Take 0.5 tablets (20 mg total) by mouth daily.     lithium carbonate 300 MG CR tablet  Commonly known as:  LITHOBID  Take 300 mg by mouth 2 (two) times daily.     nitroGLYCERIN 0.4 MG SL tablet  Commonly known as:  NITROSTAT  Place 0.4 mg under the tongue every 5 (five) minutes as needed. For chest pain     omeprazole 20 MG capsule  Commonly known as:  PRILOSEC  Take 20 mg by mouth daily.     ondansetron 8 MG disintegrating tablet  Commonly known as:  ZOFRAN ODT  Take 1 tablet (8 mg total) by mouth every 8 (eight) hours as needed for nausea.     potassium chloride 10 MEQ tablet  Commonly known as:  K-DUR  Take 20 mEq by mouth daily.     propranolol ER 60 MG 24 hr capsule  Commonly known as:  INDERAL LA  Take 60 mg by mouth daily.     QUEtiapine 300 MG tablet  Commonly known as:  SEROQUEL  Take 150 mg by mouth at bedtime.     simvastatin 80 MG  tablet  Commonly known as:  ZOCOR  Take 80 mg by mouth at bedtime.     TUDORZA PRESSAIR 400 MCG/ACT Aepb  Generic drug:  Aclidinium Bromide  Inhale 1 puff into the lungs daily.     warfarin 5 MG tablet  Commonly known as:  COUMADIN  Take 2.5-5 mg by mouth daily. Take 5mg  on day 1, 2.5mg  on day 2, 5mg  on day 3, and continue accordingly       Allergies  Allergen Reactions  . Flexeril [Cyclobenzaprine] Other (See Comments)    Alters Mental Status-ALL MUSCLE RELAXERS  . Muscle Relief [Capsaicin]  Hysteria        Follow-up Information   Follow up with GARBA,LAWAL, MD. Schedule an appointment as soon as possible for a visit in 1 week.   Specialty:  Internal Medicine   Contact information:   [address] Dr[address] Gin[place] [address]        The results of significant diagnostics from this hospitalization (including imaging, microbiology, ancillary and laboratory) are listed below for reference.    Significant Diagnostic Studies: Dg Chest Portable 1 View  11/17/2012   *RADIOLOGY REPORT*  Clinical Data: Shortness of breath.  Drug overdose.  PORTABLE CHEST - 1 VIEW  Comparison: Chest radiograph 10/31/2012  Findings: Cardiopericardial silhouette appears prominent for portable technique, and is stable.  There is diffuse pulmonary vascular congestion and diffuse interstitial prominence.   There are bibasilar opacities, left greater than right.  Left pleural effusion cannot be excluded.  No significant change in aeration of the lungs compared to 10/31/2012.  IMPRESSION: Similar appearance of the chest compared to 10/31/2012. Similar congestive heart failure pattern.  Bibasilar opacities, left greater than right, could reflect a combination of atelectasis and possibly pleural effusion.  Airspace disease secondary to aspiration or pneumonia cannot be excluded at the lung bases by chest radiograph.   Original Report Authenticated By: Britta Mccreedy, M.D.   Dg Chest Port 1 View  10/31/2012    *RADIOLOGY REPORT*  Clinical Data: Short of breath  PORTABLE CHEST - 1 VIEW  Comparison: 10/12/2012  Findings: Cardiac enlargement with vascular congestion.  Small pleural effusions and mild bibasilar atelectasis.  Negative for pneumonia.  IMPRESSION: Congestive heart failure with bilateral pleural effusions and bibasilar atelectasis.   Original Report Authenticated By: Janeece Riggers, M.D.    Microbiology: Recent Results (from the past 240 hour(s))  MRSA PCR SCREENING     Status: None   Collection Time    11/18/12  1:58 AM      Result Value Range Status   MRSA by PCR NEGATIVE  NEGATIVE Final   Comment:            The GeneXpert MRSA Assay (FDA     approved for NASAL specimens     only), is one component of a     comprehensive MRSA colonization     surveillance program. It is not     intended to diagnose MRSA     infection nor to guide or     monitor treatment for     MRSA infections.     Labs: Basic Metabolic Panel:  Recent Labs Lab 11/17/12 2032 11/18/12 0733 11/19/12 0501  NA 145 142 144  K 5.2* 4.8 4.4  CL 106 101 101  CO2 38* 36* 37*  GLUCOSE 115* 236* 210*  BUN 16 21 30*  CREATININE 1.27 1.35 1.25  CALCIUM 8.7 8.9 9.2   Liver Function Tests:  Recent Labs Lab 11/17/12 2014 11/18/12 0733  AST 7 6  ALT 6 5  ALKPHOS 110 116  BILITOT <0.1* 0.2*  PROT 6.5 6.1  ALBUMIN 2.9* 2.9*     CBC:  Recent Labs Lab 11/17/12 2032 11/18/12 0733 11/19/12 0501  WBC 9.2 6.7 7.2  NEUTROABS 6.7  --   --   HGB 11.8* 11.9* 11.4*  HCT 38.8* 37.8* 36.1*  MCV 106.6* 103.0* 101.1*  PLT 214 200 217   Cardiac Enzymes:  Recent Labs Lab 11/17/12 2032 11/18/12 0210 11/18/12 0727 11/18/12 1441  TROPONINI <0.30 <0.30 <0.30 <0.30   BNP: BNP (last 3 results)  Recent Labs  08/28/12 0755 10/31/12 1355 11/17/12 2032  PROBNP 1356.0* 1622.0* 1300.0*   CBG:  Recent Labs Lab 11/18/12 1611 11/18/12 2005 11/19/12 0001 11/19/12 0404 11/19/12 0713  GLUCAP 238* 188*  196* 198* 247*       Signed:  Raffael Bugarin C  [facility] 11/19/2012, 8:44 AM

## 2012-11-19 NOTE — Progress Notes (Signed)
Called Dr. Karilyn Cota because patient is displaying irrational behavior and is insisting on going home.

## 2012-11-19 NOTE — Care Management Note (Signed)
    Page 1 of 1   11/19/2012     9:56:19 AM   CARE MANAGEMENT NOTE 11/19/2012  Patient:  Sean Warren, Sean Warren   Account Number:  [id number]  Date Initiated:  11/19/2012  Documentation initiated by:  Sharrie Rothman  Subjective/Objective Assessment:   Pt admitted from home with respiratory failure. Pt lives with a roomate and has hired at care giver that comes daily to help pt with ADL's. Pt has home O2 and cpap for home use.     Action/Plan:   Pt anixous to be discharged. Pt declined any HH at this time since he has a care giver and roommate.Pt discharged home today.   Anticipated DC Date:  11/19/2012   Anticipated DC Plan:  HOME/SELF CARE      DC Planning Services  CM consult      Choice offered to / List presented to:             Status of service:  Completed, signed off Medicare Important Message given?  YES (If response is "NO", the following Medicare IM given date fields will be blank) Date Medicare IM given:  11/19/2012 Date Additional Medicare IM given:    Discharge Disposition:  HOME/SELF CARE  Per UR Regulation:    If discussed at Long Length of Stay Meetings, dates discussed:    Comments:  11/19/12 0955 Arlyss Queen, RN BSN CM

## 2012-11-27 ENCOUNTER — Encounter (HOSPITAL_COMMUNITY): Payer: Self-pay | Admitting: Emergency Medicine

## 2012-11-27 ENCOUNTER — Emergency Department (HOSPITAL_COMMUNITY)
Admission: EM | Admit: 2012-11-27 | Discharge: 2012-11-27 | Disposition: A | Discharge status: Home / Self Care | Payer: Medicare Other | Source: Home / Self Care | Attending: Emergency Medicine | Admitting: Emergency Medicine

## 2012-11-27 ENCOUNTER — Emergency Department (HOSPITAL_COMMUNITY): Payer: Medicare Other

## 2012-11-27 DIAGNOSIS — R159 Full incontinence of feces: Secondary | ICD-10-CM | POA: Insufficient documentation

## 2012-11-27 DIAGNOSIS — E785 Hyperlipidemia, unspecified: Secondary | ICD-10-CM | POA: Insufficient documentation

## 2012-11-27 DIAGNOSIS — F29 Unspecified psychosis not due to a substance or known physiological condition: Secondary | ICD-10-CM | POA: Insufficient documentation

## 2012-11-27 DIAGNOSIS — I5032 Chronic diastolic (congestive) heart failure: Secondary | ICD-10-CM | POA: Insufficient documentation

## 2012-11-27 DIAGNOSIS — F319 Bipolar disorder, unspecified: Secondary | ICD-10-CM | POA: Insufficient documentation

## 2012-11-27 DIAGNOSIS — M549 Dorsalgia, unspecified: Secondary | ICD-10-CM | POA: Insufficient documentation

## 2012-11-27 DIAGNOSIS — Z7901 Long term (current) use of anticoagulants: Secondary | ICD-10-CM | POA: Insufficient documentation

## 2012-11-27 DIAGNOSIS — G4733 Obstructive sleep apnea (adult) (pediatric): Secondary | ICD-10-CM | POA: Insufficient documentation

## 2012-11-27 DIAGNOSIS — E1149 Type 2 diabetes mellitus with other diabetic neurological complication: Secondary | ICD-10-CM | POA: Insufficient documentation

## 2012-11-27 DIAGNOSIS — Z79899 Other long term (current) drug therapy: Secondary | ICD-10-CM | POA: Insufficient documentation

## 2012-11-27 DIAGNOSIS — Z7982 Long term (current) use of aspirin: Secondary | ICD-10-CM | POA: Insufficient documentation

## 2012-11-27 DIAGNOSIS — I4891 Unspecified atrial fibrillation: Secondary | ICD-10-CM | POA: Insufficient documentation

## 2012-11-27 DIAGNOSIS — G8929 Other chronic pain: Secondary | ICD-10-CM | POA: Insufficient documentation

## 2012-11-27 DIAGNOSIS — Z8673 Personal history of transient ischemic attack (TIA), and cerebral infarction without residual deficits: Secondary | ICD-10-CM | POA: Insufficient documentation

## 2012-11-27 DIAGNOSIS — IMO0002 Reserved for concepts with insufficient information to code with codable children: Secondary | ICD-10-CM | POA: Insufficient documentation

## 2012-11-27 DIAGNOSIS — I509 Heart failure, unspecified: Secondary | ICD-10-CM

## 2012-11-27 DIAGNOSIS — R61 Generalized hyperhidrosis: Secondary | ICD-10-CM | POA: Insufficient documentation

## 2012-11-27 DIAGNOSIS — M7989 Other specified soft tissue disorders: Secondary | ICD-10-CM | POA: Insufficient documentation

## 2012-11-27 DIAGNOSIS — I10 Essential (primary) hypertension: Secondary | ICD-10-CM | POA: Insufficient documentation

## 2012-11-27 DIAGNOSIS — J9610 Chronic respiratory failure, unspecified whether with hypoxia or hypercapnia: Secondary | ICD-10-CM | POA: Insufficient documentation

## 2012-11-27 DIAGNOSIS — E1142 Type 2 diabetes mellitus with diabetic polyneuropathy: Secondary | ICD-10-CM | POA: Insufficient documentation

## 2012-11-27 LAB — CBC WITH DIFFERENTIAL/PLATELET
Hemoglobin: 11.6 g/dL — ABNORMAL LOW (ref 13.0–17.0)
Lymphocytes Relative: 9 % — ABNORMAL LOW (ref 12–46)
Lymphs Abs: 1 10*3/uL (ref 0.7–4.0)
MCH: 32 pg (ref 26.0–34.0)
Monocytes Relative: 8 % (ref 3–12)
Neutro Abs: 8.7 10*3/uL — ABNORMAL HIGH (ref 1.7–7.7)
Neutrophils Relative %: 82 % — ABNORMAL HIGH (ref 43–77)
RBC: 3.62 MIL/uL — ABNORMAL LOW (ref 4.22–5.81)
WBC: 10.6 10*3/uL — ABNORMAL HIGH (ref 4.0–10.5)

## 2012-11-27 LAB — COMPREHENSIVE METABOLIC PANEL
Alkaline Phosphatase: 109 U/L (ref 39–117)
BUN: 12 mg/dL (ref 6–23)
Chloride: 102 mEq/L (ref 96–112)
GFR calc Af Amer: >90 mL/min (ref >90)
Glucose, Bld: 150 mg/dL — ABNORMAL HIGH (ref 70–99)
Potassium: 4.5 mEq/L (ref 3.5–5.1)
Total Bilirubin: 0.3 mg/dL (ref 0.3–1.2)

## 2012-11-27 LAB — TROPONIN I: Troponin I: <0.3 ng/mL (ref <0.30)

## 2012-11-27 MED ORDER — DIAZEPAM 5 MG PO TABS
10.0000 mg | ORAL_TABLET | Freq: Once | ORAL | Status: AC
  Administered 2012-11-27: 10 mg via ORAL

## 2012-11-27 MED ORDER — DIAZEPAM 5 MG PO TABS
ORAL_TABLET | ORAL | Status: AC
  Administered 2012-11-27: 10 mg via ORAL
  Filled 2012-11-27: qty 2

## 2012-11-27 MED ORDER — FUROSEMIDE 40 MG PO TABS
80.0000 mg | ORAL_TABLET | Freq: Once | ORAL | Status: AC
  Administered 2012-11-27: 80 mg via ORAL
  Filled 2012-11-27: qty 2

## 2012-11-27 MED ORDER — ALBUTEROL SULFATE (5 MG/ML) 0.5% IN NEBU
5.0000 mg | INHALATION_SOLUTION | Freq: Once | RESPIRATORY_TRACT | Status: AC
  Administered 2012-11-27: 5 mg via RESPIRATORY_TRACT
  Filled 2012-11-27 (×2): qty 0.5

## 2012-11-27 NOTE — ED Provider Notes (Signed)
CSN: [id number]     Arrival date & time 11/27/12  0831 History   This chart was scribed for Sean Lennert, MD, by Yevette Edwards, ED Scribe. This patient was seen in room APA14/APA14 and the patient's care was started at 9:12 AM. First MD Initiated Contact with Patient 11/27/12 0850     Chief Complaint  Patient presents with  . Shortness of Breath    Patient is a 57 y.o. male presenting with shortness of breath. The history is provided by the patient. No language interpreter was used.  Shortness of Breath Severity:  Moderate Onset quality:  Gradual Timing:  Constant Progression:  Unchanged Chronicity:  Chronic Context: emotional upset   Relieved by:  Inhaler and oxygen Associated symptoms: diaphoresis   Associated symptoms: no abdominal pain, no chest pain, no cough, no headaches and no rash   Risk factors: obesity    HPI Comments: Sean Warren is a 57 y.o. male, with a h/o COPD, CHF, and HTN, who presents to the Emergency Department complaining of recurrent SOB; he uses 3 liters of oxygen at home. He also uses an at-home nebulizer three times a day. The pt states that he is currently diaphoretic, and he has baseline lower extremity swelling.  The pt reports that he rolled off his bed this morning, and upon awakening on the floor, he had experienced an episode of fecal incontinence. His roommate discovered him on the floor, and the roommate called EMS. Per EMS, the pt was disorientated, agitated, and covered in feces when they arrived. The pt states that he experienced similar symptoms a few weeks ago, but he did not seek medical attention.   The pt lives with a roommate.  Past Medical History  Diagnosis Date  . Hypertension   . Diabetes mellitus   . Bipolar 1 disorder   . Hyperlipidemia   . Atrial fibrillation   . COPD (chronic obstructive pulmonary disease)   . Obstructive sleep apnea   . Chronic diastolic heart failure     Echocardiogram 05/14/11: Mild LVH, EF 50-55%, no  wall motion abnormalities, grade 2 diastolic dysfunction.  . Chest pain     Myoview 05/17/11 demonstrated an EF of 61%, small fixed inferoseptal defect consistent with thinning vs prior infarct; no ischemia. - low risk  . Diabetic neuropathy   . Chronic back pain   . History of stroke   . Mental disorder     bipolar  . CHF (congestive heart failure)   . Stroke   . Neuromuscular disorder     diabetic neuropathy  . Chronic respiratory failure with hypoxia 08/08/2012  . Noncompliance with medication regimen   . Tobacco abuse    Past Surgical History  Procedure Laterality Date  . No past surgeries     History reviewed. No pertinent family history. History  Substance Use Topics  . Smoking status: Current Every Day Smoker -- 1.50 packs/day for 29 years    Types: Cigarettes  . Smokeless tobacco: Never Used  . Alcohol Use: No    Review of Systems  Constitutional: Positive for diaphoresis. Negative for appetite change and fatigue.  HENT: Negative for congestion, sinus pressure and ear discharge.   Eyes: Negative for discharge.  Respiratory: Positive for shortness of breath. Negative for cough.   Cardiovascular: Positive for leg swelling. Negative for chest pain.  Gastrointestinal: Negative for abdominal pain and diarrhea.       Fecal incontinence.   Genitourinary: Negative for frequency and hematuria.  Musculoskeletal: Negative  for back pain.  Skin: Negative for rash.  Neurological: Negative for seizures and headaches.  Psychiatric/Behavioral: Positive for confusion. Negative for hallucinations.    Allergies  Flexeril and Muscle relief  Home Medications   Current Outpatient Rx  Name  Route  Sig  Dispense  Refill  . Aclidinium Bromide (TUDORZA PRESSAIR) 400 MCG/ACT AEPB   Inhalation   Inhale 1 puff into the lungs daily.         Marland Kitchen albuterol (PROVENTIL HFA;VENTOLIN HFA) 108 (90 BASE) MCG/ACT inhaler   Inhalation   Inhale 2 puffs into the lungs every 6 (six) hours as  needed. For shortness of breath         . albuterol (PROVENTIL) (2.5 MG/3ML) 0.083% nebulizer solution   Nebulization   Take 2.5 mg by nebulization 3 (three) times daily as needed. For shortness of breath         . aspirin EC 81 MG tablet   Oral   Take 81 mg by mouth daily.         . budesonide-formoterol (SYMBICORT) 160-4.5 MCG/ACT inhaler   Inhalation   Inhale 2 puffs into the lungs 2 (two) times daily.         . clonazePAM (KLONOPIN) 0.5 MG tablet   Oral   Take 0.5 mg by mouth 2 (two) times daily.         . cloNIDine (CATAPRES) 0.2 MG tablet   Oral   Take 1 tablet (0.2 mg total) by mouth at bedtime.         . diazepam (VALIUM) 10 MG tablet   Oral   Take 1 tablet by mouth 2 (two) times daily as needed for anxiety or sleep.          Marland Kitchen diltiazem (CARDIZEM SR) 120 MG 12 hr capsule   Oral   Take 1 capsule (120 mg total) by mouth every 12 (twelve) hours. NEW MEDICATION TO CONTROL YOUR HEART RATE.   60 capsule   2   . furosemide (LASIX) 40 MG tablet   Oral   Take 80 mg by mouth daily.         Marland Kitchen gabapentin (NEURONTIN) 600 MG tablet   Oral   Take 600 mg by mouth 3 (three) times daily.         Marland Kitchen glimepiride (AMARYL) 2 MG tablet   Oral   Take 2 mg by mouth daily before breakfast.         . HYDROcodone-acetaminophen (NORCO) 10-325 MG per tablet   Oral   Take 1 tablet by mouth every 4 (four) hours as needed. For pain         . lisinopril (PRINIVIL,ZESTRIL) 40 MG tablet   Oral   Take 0.5 tablets (20 mg total) by mouth daily.         Marland Kitchen lithium carbonate (LITHOBID) 300 MG CR tablet   Oral   Take 300 mg by mouth 2 (two) times daily.         . nitroGLYCERIN (NITROSTAT) 0.4 MG SL tablet   Sublingual   Place 0.4 mg under the tongue every 5 (five) minutes as needed. For chest pain         . omeprazole (PRILOSEC) 20 MG capsule   Oral   Take 20 mg by mouth daily.         . ondansetron (ZOFRAN ODT) 8 MG disintegrating tablet   Oral   Take 1  tablet (8 mg total) by mouth every 8 (eight) hours  as needed for nausea.   10 tablet   0   . potassium chloride (K-DUR) 10 MEQ tablet   Oral   Take 20 mEq by mouth daily.         . propranolol ER (INDERAL LA) 60 MG 24 hr capsule   Oral   Take 60 mg by mouth daily.         . QUEtiapine (SEROQUEL) 300 MG tablet   Oral   Take 150 mg by mouth at bedtime.         . simvastatin (ZOCOR) 80 MG tablet   Oral   Take 80 mg by mouth at bedtime.         Marland Kitchen warfarin (COUMADIN) 5 MG tablet   Oral   Take 2.5-5 mg by mouth daily. Take 5mg  on day 1, 2.5mg  on day 2, 5mg  on day 3, and continue accordingly          Triage Vitals: BP 109/66  Pulse 108  Temp(Src) 99.8 F (37.7 C) (Oral)  Resp 22  Ht 5\' 8"  (1.727 m)  Wt 265 lb (120.203 kg)  BMI 40.3 kg/m2  SpO2 90%  Physical Exam  Constitutional: He is oriented to person, place, and time. He appears well-developed.  HENT:  Head: Normocephalic.  Dry mucus membranes.   Eyes: Conjunctivae and EOM are normal. No scleral icterus.  Neck: Neck supple. No thyromegaly present.  Cardiovascular: Normal rate and regular rhythm.  Exam reveals no gallop and no friction rub.   No murmur heard. Pulmonary/Chest: No stridor. He has no wheezes. He has no rales. He exhibits no tenderness.  Abdominal: He exhibits no distension. There is no tenderness. There is no rebound.  Musculoskeletal: Normal range of motion. He exhibits edema.  + 3 edema bilaterally from the knees down.   Lymphadenopathy:    He has no cervical adenopathy.  Neurological: He is alert and oriented to person, place, and time. Coordination normal.  Skin: No rash noted. No erythema.  Psychiatric: He has a normal mood and affect. His behavior is normal.    ED Course  Procedures (including critical care time)  DIAGNOSTIC STUDIES: Oxygen Saturation is 90% on [place], low by my interpretation.    COORDINATION OF CARE:  9:16 AM- Discussed treatment plan with patient, and the patient  agreed to the plan.   11:57 AM- Rechecked pt. Informed the pt of his imaging and lab work. The pt reports he normally takes two 40 mg Lasix a day, but he did not take them today. He also states he is ready to be discharged. The pt reports that he uses a cane to assist with ambulation.   Labs Review Labs Reviewed  CBC WITH DIFFERENTIAL - Abnormal; Notable for the following:    WBC 10.6 (*)    RBC 3.62 (*)    Hemoglobin 11.6 (*)    HCT 37.8 (*)    MCV 104.4 (*)    Neutrophils Relative % 82 (*)    Neutro Abs 8.7 (*)    Lymphocytes Relative 9 (*)    All other components within normal limits  COMPREHENSIVE METABOLIC PANEL - Abnormal; Notable for the following:    CO2 38 (*)    Glucose, Bld 150 (*)    Albumin 3.0 (*)    All other components within normal limits  PRO B NATRIURETIC PEPTIDE - Abnormal; Notable for the following:    Pro B Natriuretic peptide (BNP) 2577.0 (*)    All other components within normal limits  TROPONIN I  PROTIME-INR   Imaging Review Ct Head Wo Contrast  11/27/2012   *RADIOLOGY REPORT*  Clinical Data:  History of injury from fall.  History of disorientation and confusion and agitation.  History of hypertension, diabetes, CVA, and bipolar disorder.  CT HEAD WITHOUT CONTRAST  Technique: Contiguous axial images were obtained from the base of the skull through the vertex without contrast.  History that the patient was disoriented and confused and could not hold still. Best obtainable scan was obtained.  Comparison:  None  Findings: Motion artifact degrades some of the images.  Two sequences of images were obtained.  There is no evidence of brain mass, brain hemorrhage, or acute infarction.  There is a tiny hypodense area is seen in the deep right parietal region consistent with a tiny area of previous lacunar infarction.  The ventricular system is normal size and shape.  There is no evidence of shift of midline structures, parenchymal lesion, or subdural or epidural  hematoma.  The calvarium is intact.  Mastoids are well aerated.  No sinusitis is evident.  IMPRESSION:  Motion artifact degrades some of the images.  There is no evidence of brain mass, brain hemorrhage, or acute infarction.  There is a tiny hypodense area is seen in the deep right parietal region consistent with a tiny area of previous lacunar infarction.  No acute or active brain process is seen.  No skull lesion is evident.  No sinusitis is evident.   Original Report Authenticated By: Onalee Hua Call   Dg Chest Port 1 View  11/27/2012   CLINICAL DATA:  Shortness of Breath  EXAM: PORTABLE CHEST - 1 VIEW  COMPARISON:  11/17/2012  FINDINGS: Cardiomegaly and central mild vascular congestion again noted. Mild interstitial prominence bilaterally with slight worsening in the aeration suspicious for mild interstitial edema. No focal consolidation.  IMPRESSION: Cardiomegaly. Central mild vascular congestion.Mild interstitial prominence bilaterally with slight worsening in the aeration suspicious for mild interstitial edema. No focal consolidation.   Electronically Signed   By: Natasha Mead   On: 11/27/2012 10:02    Date: 11/27/2012  Rate: 127  Rhythm: atrial fibrillation  QRS Axis: normal  Intervals: normal  ST/T Wave abnormalities: nonspecific st changes  Conduction Disutrbances:first-degree A-V block   Narrative Interpretation:   Old EKG Reviewed: unchanged   MDM  No diagnosis found. Pt with mild chf and atrial fib.  The pt states he does not want to stay at the hospital.  He was told he should be admitted  The chart was scribed for me under my direct supervision.  I personally performed the history, physical, and medical decision making and all procedures in the evaluation of this patient.Sean Lennert, MD 11/27/12 [phone]

## 2012-11-27 NOTE — ED Notes (Signed)
Patient sent home with O2 tank.  To return it tomorrow.

## 2012-11-27 NOTE — ED Notes (Signed)
Patient covered in dried feces.  Bathed patient and changed linens.  Patient demanding something to eat and drink.  Gave diet gingerale, but told him he couldn't have food.  States he wants to go home.  States he is not having bloodwork done.

## 2012-11-27 NOTE — ED Notes (Signed)
Per EMS, was called out for SOB. Pt covered in feces upon arrival. Pt disoriented and agitated. Pt cbg 181 en route.

## 2012-11-27 NOTE — ED Notes (Signed)
Patient with no complaints at this time. Respirations even and unlabored. Skin warm/dry. Discharge instructions reviewed with patient at this time. Patient given opportunity to voice concerns/ask questions. Patient discharged at this time and left Emergency Department with steady gait.   

## 2012-11-27 NOTE — ED Notes (Signed)
Patient continuously asks for drinks and ice.  He has had 4 diet gingerales and breakfast tray.  Told him, due to his leg edema, I would not be giving him anymore drinks.  He continues to say, "I'm going home, I'm leaving," repetitively.

## 2012-11-29 ENCOUNTER — Emergency Department (HOSPITAL_COMMUNITY): Payer: Medicare Other

## 2012-11-29 ENCOUNTER — Encounter (HOSPITAL_COMMUNITY): Payer: Self-pay | Admitting: *Deleted

## 2012-11-29 ENCOUNTER — Inpatient Hospital Stay (HOSPITAL_COMMUNITY)
Admission: EM | Admit: 2012-11-29 | Discharge: 2012-12-05 | DRG: 208 | Disposition: A | Discharge status: Other Acute Inpatient Hospital | Payer: Medicare Other | Attending: Internal Medicine | Admitting: Internal Medicine

## 2012-11-29 DIAGNOSIS — E876 Hypokalemia: Secondary | ICD-10-CM

## 2012-11-29 DIAGNOSIS — E861 Hypovolemia: Secondary | ICD-10-CM | POA: Diagnosis present

## 2012-11-29 DIAGNOSIS — M549 Dorsalgia, unspecified: Secondary | ICD-10-CM | POA: Diagnosis present

## 2012-11-29 DIAGNOSIS — R000 Tachycardia, unspecified: Secondary | ICD-10-CM

## 2012-11-29 DIAGNOSIS — R0689 Other abnormalities of breathing: Secondary | ICD-10-CM

## 2012-11-29 DIAGNOSIS — R4182 Altered mental status, unspecified: Secondary | ICD-10-CM

## 2012-11-29 DIAGNOSIS — I509 Heart failure, unspecified: Secondary | ICD-10-CM | POA: Diagnosis present

## 2012-11-29 DIAGNOSIS — I4892 Unspecified atrial flutter: Secondary | ICD-10-CM

## 2012-11-29 DIAGNOSIS — E785 Hyperlipidemia, unspecified: Secondary | ICD-10-CM

## 2012-11-29 DIAGNOSIS — R55 Syncope and collapse: Secondary | ICD-10-CM

## 2012-11-29 DIAGNOSIS — J40 Bronchitis, not specified as acute or chronic: Secondary | ICD-10-CM

## 2012-11-29 DIAGNOSIS — E1149 Type 2 diabetes mellitus with other diabetic neurological complication: Secondary | ICD-10-CM | POA: Diagnosis present

## 2012-11-29 DIAGNOSIS — Z91199 Patient's noncompliance with other medical treatment and regimen due to unspecified reason: Secondary | ICD-10-CM

## 2012-11-29 DIAGNOSIS — I10 Essential (primary) hypertension: Secondary | ICD-10-CM

## 2012-11-29 DIAGNOSIS — J189 Pneumonia, unspecified organism: Secondary | ICD-10-CM

## 2012-11-29 DIAGNOSIS — I959 Hypotension, unspecified: Secondary | ICD-10-CM | POA: Diagnosis present

## 2012-11-29 DIAGNOSIS — J9620 Acute and chronic respiratory failure, unspecified whether with hypoxia or hypercapnia: Secondary | ICD-10-CM

## 2012-11-29 DIAGNOSIS — E669 Obesity, unspecified: Secondary | ICD-10-CM

## 2012-11-29 DIAGNOSIS — J9611 Chronic respiratory failure with hypoxia: Secondary | ICD-10-CM

## 2012-11-29 DIAGNOSIS — J449 Chronic obstructive pulmonary disease, unspecified: Secondary | ICD-10-CM

## 2012-11-29 DIAGNOSIS — Z6830 Body mass index (BMI) 30.0-30.9, adult: Secondary | ICD-10-CM

## 2012-11-29 DIAGNOSIS — I4891 Unspecified atrial fibrillation: Secondary | ICD-10-CM

## 2012-11-29 DIAGNOSIS — F17200 Nicotine dependence, unspecified, uncomplicated: Secondary | ICD-10-CM | POA: Diagnosis present

## 2012-11-29 DIAGNOSIS — E119 Type 2 diabetes mellitus without complications: Secondary | ICD-10-CM

## 2012-11-29 DIAGNOSIS — T68XXXA Hypothermia, initial encounter: Secondary | ICD-10-CM

## 2012-11-29 DIAGNOSIS — R0902 Hypoxemia: Secondary | ICD-10-CM

## 2012-11-29 DIAGNOSIS — R680 Hypothermia, not associated with low environmental temperature: Secondary | ICD-10-CM | POA: Diagnosis present

## 2012-11-29 DIAGNOSIS — Z720 Tobacco use: Secondary | ICD-10-CM

## 2012-11-29 DIAGNOSIS — I5032 Chronic diastolic (congestive) heart failure: Secondary | ICD-10-CM

## 2012-11-29 DIAGNOSIS — I5033 Acute on chronic diastolic (congestive) heart failure: Secondary | ICD-10-CM

## 2012-11-29 DIAGNOSIS — G4733 Obstructive sleep apnea (adult) (pediatric): Secondary | ICD-10-CM

## 2012-11-29 DIAGNOSIS — Z8673 Personal history of transient ischemic attack (TIA), and cerebral infarction without residual deficits: Secondary | ICD-10-CM

## 2012-11-29 DIAGNOSIS — G8929 Other chronic pain: Secondary | ICD-10-CM | POA: Diagnosis present

## 2012-11-29 DIAGNOSIS — F411 Generalized anxiety disorder: Secondary | ICD-10-CM | POA: Diagnosis present

## 2012-11-29 DIAGNOSIS — Z7901 Long term (current) use of anticoagulants: Secondary | ICD-10-CM

## 2012-11-29 DIAGNOSIS — I639 Cerebral infarction, unspecified: Secondary | ICD-10-CM

## 2012-11-29 DIAGNOSIS — J441 Chronic obstructive pulmonary disease with (acute) exacerbation: Secondary | ICD-10-CM

## 2012-11-29 DIAGNOSIS — I480 Paroxysmal atrial fibrillation: Secondary | ICD-10-CM

## 2012-11-29 DIAGNOSIS — E66811 Obesity, class 1: Secondary | ICD-10-CM

## 2012-11-29 DIAGNOSIS — Z9119 Patient's noncompliance with other medical treatment and regimen: Secondary | ICD-10-CM

## 2012-11-29 DIAGNOSIS — F319 Bipolar disorder, unspecified: Secondary | ICD-10-CM

## 2012-11-29 DIAGNOSIS — Z9981 Dependence on supplemental oxygen: Secondary | ICD-10-CM

## 2012-11-29 DIAGNOSIS — R21 Rash and other nonspecific skin eruption: Secondary | ICD-10-CM

## 2012-11-29 DIAGNOSIS — Z79899 Other long term (current) drug therapy: Secondary | ICD-10-CM

## 2012-11-29 DIAGNOSIS — J9610 Chronic respiratory failure, unspecified whether with hypoxia or hypercapnia: Secondary | ICD-10-CM

## 2012-11-29 DIAGNOSIS — E1142 Type 2 diabetes mellitus with diabetic polyneuropathy: Secondary | ICD-10-CM | POA: Diagnosis present

## 2012-11-29 DIAGNOSIS — E870 Hyperosmolality and hypernatremia: Secondary | ICD-10-CM

## 2012-11-29 LAB — CBC WITH DIFFERENTIAL/PLATELET
Eosinophils Absolute: 0 10*3/uL (ref 0.0–0.7)
Eosinophils Relative: 0 % (ref 0–5)
HCT: 44.1 % (ref 39.0–52.0)
Lymphocytes Relative: 13 % (ref 12–46)
Lymphs Abs: 1.2 10*3/uL (ref 0.7–4.0)
MCH: 31.9 pg (ref 26.0–34.0)
MCV: 102.8 fL — ABNORMAL HIGH (ref 78.0–100.0)
Monocytes Absolute: 0.6 10*3/uL (ref 0.1–1.0)
Platelets: 197 10*3/uL (ref 150–400)
RDW: 13.6 % (ref 11.5–15.5)
WBC: 8.8 10*3/uL (ref 4.0–10.5)

## 2012-11-29 LAB — BLOOD GAS, ARTERIAL
Acid-Base Excess: 8.7 mmol/L — ABNORMAL HIGH (ref 0.0–2.0)
Acid-Base Excess: 9.4 mmol/L — ABNORMAL HIGH (ref 0.0–2.0)
Delivery systems: POSITIVE
Drawn by: 23534
Drawn by: 23534
Expiratory PAP: 6
FIO2: 0.6 %
Inspiratory PAP: 20
MECHVT: 550 mL
O2 Content: 60 L/min
O2 Saturation: 99.1 %
RATE: 16 resp/min
TCO2: 32.5 mmol/L (ref 0–100)
pCO2 arterial: 104 mmHg (ref 35.0–45.0)
pCO2 arterial: 68 mmHg (ref 35.0–45.0)
pH, Arterial: 7.177 — CL (ref 7.350–7.450)
pO2, Arterial: 61.1 mmHg — ABNORMAL LOW (ref 80.0–100.0)

## 2012-11-29 LAB — TYPE AND SCREEN
ABO/RH(D): AB POS
Antibody Screen: NEGATIVE

## 2012-11-29 LAB — URINALYSIS, ROUTINE W REFLEX MICROSCOPIC
Glucose, UA: NEGATIVE mg/dL
Hgb urine dipstick: NEGATIVE
Protein, ur: 30 mg/dL — AB
Urobilinogen, UA: 0.2 mg/dL (ref 0.0–1.0)

## 2012-11-29 LAB — COMPREHENSIVE METABOLIC PANEL
CO2: 38 mEq/L — ABNORMAL HIGH (ref 19–32)
Calcium: 9.5 mg/dL (ref 8.4–10.5)
Creatinine, Ser: 1.08 mg/dL (ref 0.50–1.35)
GFR calc Af Amer: 85 mL/min — ABNORMAL LOW (ref >90)
GFR calc non Af Amer: 73 mL/min — ABNORMAL LOW (ref >90)
Glucose, Bld: 212 mg/dL — ABNORMAL HIGH (ref 70–99)
Sodium: 142 mEq/L (ref 135–145)
Total Protein: 7.5 g/dL (ref 6.0–8.3)

## 2012-11-29 LAB — RAPID URINE DRUG SCREEN, HOSP PERFORMED
Benzodiazepines: POSITIVE — AB
Cocaine: NOT DETECTED
Opiates: NOT DETECTED

## 2012-11-29 LAB — GLUCOSE, CAPILLARY
Glucose-Capillary: 158 mg/dL — ABNORMAL HIGH (ref 70–99)
Glucose-Capillary: 163 mg/dL — ABNORMAL HIGH (ref 70–99)

## 2012-11-29 LAB — PROTIME-INR
INR: 1.01 (ref 0.00–1.49)
Prothrombin Time: 13.1 seconds (ref 11.6–15.2)

## 2012-11-29 LAB — URINE MICROSCOPIC-ADD ON

## 2012-11-29 LAB — TSH: TSH: 0.544 u[IU]/mL (ref 0.350–4.500)

## 2012-11-29 LAB — LACTIC ACID, PLASMA: Lactic Acid, Venous: 1.2 mmol/L (ref 0.5–2.2)

## 2012-11-29 LAB — CORTISOL: Cortisol, Plasma: 29.1 ug/dL

## 2012-11-29 LAB — AMMONIA: Ammonia: 20 umol/L (ref 11–60)

## 2012-11-29 MED ORDER — DILTIAZEM HCL 25 MG/5ML IV SOLN
10.0000 mg | Freq: Four times a day (QID) | INTRAVENOUS | Status: DC
  Administered 2012-11-29 – 2012-11-30 (×4): 10 mg via INTRAVENOUS
  Filled 2012-11-29 (×4): qty 5

## 2012-11-29 MED ORDER — SODIUM CHLORIDE 0.9 % IJ SOLN
3.0000 mL | Freq: Two times a day (BID) | INTRAMUSCULAR | Status: DC
  Administered 2012-11-29 – 2012-12-04 (×7): 3 mL via INTRAVENOUS

## 2012-11-29 MED ORDER — METHYLPREDNISOLONE SODIUM SUCC 125 MG IJ SOLR
125.0000 mg | Freq: Once | INTRAMUSCULAR | Status: AC
  Administered 2012-11-29: 125 mg via INTRAVENOUS
  Filled 2012-11-29: qty 2

## 2012-11-29 MED ORDER — METHYLPREDNISOLONE SODIUM SUCC 125 MG IJ SOLR
80.0000 mg | Freq: Four times a day (QID) | INTRAMUSCULAR | Status: DC
  Administered 2012-11-29 – 2012-12-02 (×13): 80 mg via INTRAVENOUS
  Filled 2012-11-29 (×13): qty 2

## 2012-11-29 MED ORDER — FUROSEMIDE 10 MG/ML IJ SOLN
40.0000 mg | Freq: Two times a day (BID) | INTRAMUSCULAR | Status: DC
  Administered 2012-11-29 – 2012-12-03 (×8): 40 mg via INTRAVENOUS
  Filled 2012-11-29 (×8): qty 4

## 2012-11-29 MED ORDER — FENTANYL CITRATE 0.05 MG/ML IJ SOLN
50.0000 ug | INTRAMUSCULAR | Status: AC | PRN
  Administered 2012-11-29: 100 ug via INTRAVENOUS
  Filled 2012-11-29: qty 2

## 2012-11-29 MED ORDER — CHLORHEXIDINE GLUCONATE 0.12 % MT SOLN
15.0000 mL | Freq: Two times a day (BID) | OROMUCOSAL | Status: DC
  Administered 2012-11-29 – 2012-12-01 (×4): 15 mL via OROMUCOSAL
  Filled 2012-11-29 (×4): qty 15

## 2012-11-29 MED ORDER — SUCCINYLCHOLINE CHLORIDE 20 MG/ML IJ SOLN
INTRAMUSCULAR | Status: AC
  Administered 2012-11-29: 100 mg via INTRAVENOUS
  Filled 2012-11-29: qty 1

## 2012-11-29 MED ORDER — ALBUTEROL SULFATE (5 MG/ML) 0.5% IN NEBU
INHALATION_SOLUTION | RESPIRATORY_TRACT | Status: AC
  Filled 2012-11-29: qty 1

## 2012-11-29 MED ORDER — FENTANYL CITRATE 0.05 MG/ML IJ SOLN
INTRAMUSCULAR | Status: AC
  Filled 2012-11-29: qty 50

## 2012-11-29 MED ORDER — ACETAMINOPHEN 650 MG RE SUPP: 650.0000 mg | Freq: Four times a day (QID) | RECTAL | Status: DC | PRN

## 2012-11-29 MED ORDER — SODIUM CHLORIDE 0.9 % IJ SOLN: 3.0000 mL | INTRAMUSCULAR | Status: DC | PRN

## 2012-11-29 MED ORDER — ENOXAPARIN SODIUM 40 MG/0.4ML ~~LOC~~ SOLN
40.0000 mg | SUBCUTANEOUS | Status: DC
  Administered 2012-11-29: 40 mg via SUBCUTANEOUS
  Filled 2012-11-29: qty 0.4

## 2012-11-29 MED ORDER — SODIUM CHLORIDE 0.9 % IV SOLN
250.0000 mL | INTRAVENOUS | Status: DC | PRN
  Administered 2012-11-30: 250 mL via INTRAVENOUS

## 2012-11-29 MED ORDER — SODIUM CHLORIDE 0.9 % IV SOLN
1000.0000 mL | INTRAVENOUS | Status: DC
  Administered 2012-11-29: 1000 mL via INTRAVENOUS

## 2012-11-29 MED ORDER — ACETAMINOPHEN 325 MG PO TABS: 650.0000 mg | ORAL_TABLET | Freq: Four times a day (QID) | ORAL | Status: DC | PRN

## 2012-11-29 MED ORDER — IPRATROPIUM BROMIDE 0.02 % IN SOLN
RESPIRATORY_TRACT | Status: AC
  Filled 2012-11-29: qty 2.5

## 2012-11-29 MED ORDER — MIDAZOLAM HCL 2 MG/2ML IJ SOLN
2.0000 mg | INTRAMUSCULAR | Status: DC | PRN
  Administered 2012-11-29: 2 mg via INTRAVENOUS
  Filled 2012-11-29: qty 2

## 2012-11-29 MED ORDER — MIDAZOLAM HCL 2 MG/2ML IJ SOLN
2.0000 mg | Freq: Once | INTRAMUSCULAR | Status: AC
  Administered 2012-11-29: 2 mg via INTRAVENOUS
  Filled 2012-11-29: qty 2

## 2012-11-29 MED ORDER — PANTOPRAZOLE SODIUM 40 MG IV SOLR
40.0000 mg | INTRAVENOUS | Status: DC
  Administered 2012-11-29 – 2012-12-05 (×7): 40 mg via INTRAVENOUS
  Filled 2012-11-29 (×7): qty 40

## 2012-11-29 MED ORDER — BIOTENE DRY MOUTH MT LIQD
15.0000 mL | Freq: Two times a day (BID) | OROMUCOSAL | Status: DC
  Administered 2012-11-30 – 2012-12-05 (×8): 15 mL via OROMUCOSAL

## 2012-11-29 MED ORDER — INSULIN ASPART 100 UNIT/ML ~~LOC~~ SOLN
0.0000 [IU] | SUBCUTANEOUS | Status: DC
  Administered 2012-11-29: 3 [IU] via SUBCUTANEOUS
  Administered 2012-11-29: 2 [IU] via SUBCUTANEOUS
  Administered 2012-11-29 – 2012-11-30 (×3): 3 [IU] via SUBCUTANEOUS
  Administered 2012-11-30: 5 [IU] via SUBCUTANEOUS
  Administered 2012-11-30: 3 [IU] via SUBCUTANEOUS
  Administered 2012-11-30: 5 [IU] via SUBCUTANEOUS
  Administered 2012-11-30 – 2012-12-01 (×2): 3 [IU] via SUBCUTANEOUS
  Administered 2012-12-01: 5 [IU] via SUBCUTANEOUS
  Administered 2012-12-01: 3 [IU] via SUBCUTANEOUS
  Administered 2012-12-01: 8 [IU] via SUBCUTANEOUS
  Administered 2012-12-01 (×2): 5 [IU] via SUBCUTANEOUS
  Administered 2012-12-02: 3 [IU] via SUBCUTANEOUS
  Administered 2012-12-02: 5 [IU] via SUBCUTANEOUS
  Administered 2012-12-02: 3 [IU] via SUBCUTANEOUS
  Administered 2012-12-02: 5 [IU] via SUBCUTANEOUS

## 2012-11-29 MED ORDER — LEVOFLOXACIN IN D5W 750 MG/150ML IV SOLN
750.0000 mg | INTRAVENOUS | Status: DC
  Administered 2012-11-29 – 2012-12-05 (×7): 750 mg via INTRAVENOUS
  Filled 2012-11-29 (×9): qty 150

## 2012-11-29 MED ORDER — SODIUM CHLORIDE 0.9 % IV SOLN
25.0000 ug/h | INTRAVENOUS | Status: DC
  Administered 2012-11-29 (×2): 50 ug/h via INTRAVENOUS
  Administered 2012-11-30 (×2): 300 ug/h via INTRAVENOUS
  Administered 2012-12-01: 100 ug/h via INTRAVENOUS
  Filled 2012-11-29 (×2): qty 50

## 2012-11-29 MED ORDER — SODIUM CHLORIDE 0.9 % IV SOLN
1.0000 mg/h | INTRAVENOUS | Status: DC
  Administered 2012-11-29: 2 mg/h via INTRAVENOUS
  Administered 2012-11-29: 1 mg/h via INTRAVENOUS
  Administered 2012-11-29: 8 mg/h via INTRAVENOUS
  Administered 2012-11-30 (×2): 10 mg/h via INTRAVENOUS
  Administered 2012-11-30: 9 mg/h via INTRAVENOUS
  Administered 2012-11-30: 10 mg/h via INTRAVENOUS
  Administered 2012-12-01: 5 mg/h via INTRAVENOUS
  Filled 2012-11-29 (×2): qty 10

## 2012-11-29 MED ORDER — ETOMIDATE 2 MG/ML IV SOLN
15.0000 mg | Freq: Once | INTRAVENOUS | Status: AC
  Administered 2012-11-29: 15 mg via INTRAVENOUS

## 2012-11-29 MED ORDER — ETOMIDATE 2 MG/ML IV SOLN
INTRAVENOUS | Status: AC
  Administered 2012-11-29: 15 mg via INTRAVENOUS
  Filled 2012-11-29: qty 20

## 2012-11-29 MED ORDER — MIDAZOLAM HCL 50 MG/10ML IJ SOLN
INTRAMUSCULAR | Status: AC
  Filled 2012-11-29: qty 1

## 2012-11-29 MED ORDER — SUCCINYLCHOLINE CHLORIDE 20 MG/ML IJ SOLN
100.0000 mg | Freq: Once | INTRAMUSCULAR | Status: AC
  Administered 2012-11-29: 100 mg via INTRAVENOUS

## 2012-11-29 MED ORDER — IPRATROPIUM BROMIDE 0.02 % IN SOLN
0.5000 mg | Freq: Once | RESPIRATORY_TRACT | Status: AC
  Administered 2012-11-29: 0.5 mg via RESPIRATORY_TRACT

## 2012-11-29 MED ORDER — FENTANYL CITRATE 0.05 MG/ML IJ SOLN
50.0000 ug | INTRAMUSCULAR | Status: AC | PRN
  Administered 2012-11-29 (×3): 100 ug via INTRAVENOUS
  Filled 2012-11-29 (×2): qty 2

## 2012-11-29 MED ORDER — ROCURONIUM BROMIDE 50 MG/5ML IV SOLN
INTRAVENOUS | Status: AC
  Filled 2012-11-29: qty 2

## 2012-11-29 MED ORDER — ALBUTEROL SULFATE (5 MG/ML) 0.5% IN NEBU
5.0000 mg | INHALATION_SOLUTION | Freq: Once | RESPIRATORY_TRACT | Status: AC
  Administered 2012-11-29: 5 mg via RESPIRATORY_TRACT

## 2012-11-29 MED ORDER — SODIUM CHLORIDE 0.9 % IV SOLN: 2.0000 mg/h | INTRAVENOUS | Status: DC

## 2012-11-29 MED ORDER — LIDOCAINE HCL (CARDIAC) 20 MG/ML IV SOLN
INTRAVENOUS | Status: AC
  Filled 2012-11-29: qty 5

## 2012-11-29 NOTE — ED Notes (Signed)
Patient bucking ventilator when coughing otherwise comfortable.

## 2012-11-29 NOTE — ED Notes (Signed)
EMS was called to residence last night at 10pm and pt refused transport for SOB. Pt fell during the night and was in the floor for an undetermined amount of time. O2 sats initially 86% at home on 2L [place] per EMS. Pt refused CPAP en route. CBG en route 245

## 2012-11-29 NOTE — ED Notes (Signed)
CRITICAL VALUE ALERT  Critical value received:  Ph 7.17, CO2 104, PO2 61.1, Bicarb 37.0, Sats 86.5 on bipap  Date of notification:  11/29/12  Time of notification:  1129  Critical value read back:yes  Nurse who received alert:  Santiago Bur, RN  MD notified (1st page):    Time of first page:    MD notified (2nd page):  Time of second page:  Responding MD:  Dr. Linwood Dibbles  Time MD responded:  [phone]

## 2012-11-29 NOTE — H&P (Signed)
[facility] History and Physical  Sean Warren:[id number] DOB: November 18, 1955 DOA: 11/29/2012  Referring physician: Iantha Fallen, MD PCP: [name], MD   Chief Complaint:  AMS and shortness of breath  History obtained from the ED physician  HPI:  57 year old obese male with history of A. fib on Coumadin, COPD with chronic respiratory failure, chronic diastolic CHF, obstructive sleep apnea who has been admitted several times in past few months with acute on chronic respiratory failure and CHF symptoms was recently hospitalized for acute respiratory failure. He presented to the ED 2 days back with shortness of breath and appear to be in mild respiratory failure with A. fib. He was advised to be admitted but he refused and went home. Patient had symptoms of shortness of breath yesterday and EMS was called or when his house last night but patient refused to come to the ED patient EMS was again called this morning when he was found lying in the floor lethargic and noncommunicative. Patient's O2 sat was 86% on 2 L via nasal cannula. Patient was lethargic. He was tried on BiPAP but was refusing to keep it on.  arterial blood gas done that showed pH of 7.17, PCO2 of 104, PO2 of 61 and bicarbonate of 37. He was intubated and placed on mechanical ventilator in the ED. A Foley was placed in as well. 5 hospitalists called for admission to ICU. The ED patient was given a dose of IV Solu-Medrol with concern for COPD exacerbation. He was also noted to be hypothermic with temperature of 65F.   Review of Systems:  As outlined in HPI   Past Medical History  Diagnosis Date  . Hypertension   . Diabetes mellitus   . Bipolar 1 disorder   . Hyperlipidemia   . Atrial fibrillation   . COPD (chronic obstructive pulmonary disease)   . Obstructive sleep apnea   . Chronic diastolic heart failure     Echocardiogram 05/14/11: Mild LVH, EF 50-55%, no wall motion abnormalities, grade 2 diastolic dysfunction.   . Chest pain     Myoview 05/17/11 demonstrated an EF of 61%, small fixed inferoseptal defect consistent with thinning vs prior infarct; no ischemia. - low risk  . Diabetic neuropathy   . Chronic back pain   . History of stroke   . Mental disorder     bipolar  . CHF (congestive heart failure)   . Stroke   . Neuromuscular disorder     diabetic neuropathy  . Chronic respiratory failure with hypoxia 08/08/2012  . Noncompliance with medication regimen   . Tobacco abuse    Past Surgical History  Procedure Laterality Date  . No past surgeries     Social History:  reports that he has been smoking Cigarettes.  He has a 43.5 pack-year smoking history. He has never used smokeless tobacco. He reports that he does not drink alcohol or use illicit drugs.  Allergies  Allergen Reactions  . Flexeril [Cyclobenzaprine] Other (See Comments)    Alters Mental Status-ALL MUSCLE RELAXERS  . Muscle Relief [Capsaicin]     Hysteria     No family history on file.  Prior to Admission medications   Medication Sig Start Date End Date Taking? Authorizing Provider  Aclidinium Bromide (TUDORZA PRESSAIR) 400 MCG/ACT AEPB Inhale 1 puff into the lungs daily.    Historical Provider, MD  albuterol (PROVENTIL HFA;VENTOLIN HFA) 108 (90 BASE) MCG/ACT inhaler Inhale 2 puffs into the lungs every 6 (six) hours as needed. For shortness of breath  Historical Provider, MD  albuterol (PROVENTIL) (2.5 MG/3ML) 0.083% nebulizer solution Take 2.5 mg by nebulization 3 (three) times daily as needed. For shortness of breath    Historical Provider, MD  aspirin EC 81 MG tablet Take 81 mg by mouth daily.    Historical Provider, MD  budesonide-formoterol (SYMBICORT) 160-4.5 MCG/ACT inhaler Inhale 2 puffs into the lungs 2 (two) times daily.    Historical Provider, MD  clonazePAM (KLONOPIN) 0.5 MG tablet Take 0.5 mg by mouth 2 (two) times daily.    Historical Provider, MD  cloNIDine (CATAPRES) 0.2 MG tablet Take 1 tablet (0.2 mg total)  by mouth at bedtime. 08/09/12   Elliot Cousin, MD  diazepam (VALIUM) 10 MG tablet Take 1 tablet by mouth 2 (two) times daily as needed for anxiety or sleep.  08/18/12   Historical Provider, MD  diltiazem (CARDIZEM SR) 120 MG 12 hr capsule Take 1 capsule (120 mg total) by mouth every 12 (twelve) hours. NEW MEDICATION TO CONTROL YOUR HEART RATE. 08/09/12   Elliot Cousin, MD  furosemide (LASIX) 40 MG tablet Take 80 mg by mouth daily.    Historical Provider, MD  gabapentin (NEURONTIN) 600 MG tablet Take 600 mg by mouth 3 (three) times daily.    Historical Provider, MD  glimepiride (AMARYL) 2 MG tablet Take 2 mg by mouth daily before breakfast.    Historical Provider, MD  HYDROcodone-acetaminophen (NORCO) 10-325 MG per tablet Take 1 tablet by mouth every 4 (four) hours as needed. For pain    Historical Provider, MD  lisinopril (PRINIVIL,ZESTRIL) 40 MG tablet Take 0.5 tablets (20 mg total) by mouth daily. 08/09/12   Elliot Cousin, MD  lithium carbonate (LITHOBID) 300 MG CR tablet Take 300 mg by mouth 2 (two) times daily.    Historical Provider, MD  nitroGLYCERIN (NITROSTAT) 0.4 MG SL tablet Place 0.4 mg under the tongue every 5 (five) minutes as needed. For chest pain    Historical Provider, MD  omeprazole (PRILOSEC) 20 MG capsule Take 20 mg by mouth daily.    Historical Provider, MD  ondansetron (ZOFRAN ODT) 8 MG disintegrating tablet Take 1 tablet (8 mg total) by mouth every 8 (eight) hours as needed for nausea. 10/12/12   [name], MD  potassium chloride (K-DUR) 10 MEQ tablet Take 20 mEq by mouth daily.    Historical Provider, MD  propranolol ER (INDERAL LA) 60 MG 24 hr capsule Take 60 mg by mouth daily.    Historical Provider, MD  QUEtiapine (SEROQUEL) 300 MG tablet Take 150 mg by mouth at bedtime.    Historical Provider, MD  simvastatin (ZOCOR) 80 MG tablet Take 80 mg by mouth at bedtime.    Historical Provider, MD  warfarin (COUMADIN) 5 MG tablet Take 2.5-5 mg by mouth daily. Take 5mg  on day 1, 2.5mg  on  day 2, 5mg  on day 3, and continue accordingly    Historical Provider, MD    Physical Exam:  Filed Vitals:   11/29/12 1223 11/29/12 1228 11/29/12 1237 11/29/12 1245  BP: 120/105   103/81  Pulse: 117 70 58 91  Temp:   93.1 F (33.9 C) 95 F (35 C)  Resp: 14 21 27 19   SpO2: 97% 100% 100% 100%    Constitutional: Vital signs reviewed.  Patient is a middle aged obese male intubated and on ventilator. Sedated. HEENT: Pupils reactive bilaterally, no pallor , OG tube to suction Cardiovascular: RRR, S1 and S2 irregular, no MRG, pulses symmetric and intact bilaterally. Pulmonary/Chest: CTAB, scattered rhonchi  Abdominal: Soft. Non-tender, non-distended, bowel sounds are normal,   extremities: 1+ pitting edema bilaterally, foley in place. CNS: sedated.   Labs on Admission:  Basic Metabolic Panel:  Recent Labs Lab 11/27/12 0924 11/29/12 1100  NA 142 142  K 4.5 4.6  CL 102 97  CO2 38* 38*  GLUCOSE 150* 212*  BUN 12 21  CREATININE 0.90 1.08  CALCIUM 9.3 9.5   Liver Function Tests:  Recent Labs Lab 11/27/12 0924 11/29/12 1100  AST 8 10  ALT 11 13  ALKPHOS 109 120*  BILITOT 0.3 0.3  PROT 6.8 7.5  ALBUMIN 3.0* 3.3*   No results found for this basename: LIPASE, AMYLASE,  in the last 168 hours  Recent Labs Lab 11/29/12 1101  AMMONIA 20   CBC:  Recent Labs Lab 11/27/12 0924 11/29/12 1100  WBC 10.6* 8.8  NEUTROABS 8.7* 7.0  HGB 11.6* 13.7  HCT 37.8* 44.1  MCV 104.4* 102.8*  PLT 215 197   Cardiac Enzymes:  Recent Labs Lab 11/27/12 0924  TROPONINI <0.30   BNP: No components found with this basename: POCBNP,  CBG:  Recent Labs Lab 11/29/12 1121  GLUCAP 201*    Radiological Exams on Admission: No results found.    Assessment/Plan Principal Problem:   Acute-on-chronic respiratory failure Patient intubated in ED given hypercarbic respiratory failure with AMS. Admit to ICU on ventilator. Repeat ABG shows improved  Po2 and Pco2 on vent. Monitor  respoiratory and neurological status. Pulmonary consulted to help with vent management. symptoms likely in the setting of COPD exacerbation with hypercarbic respiratory failure along with CHF and OSA. -IV solumedrol 80 mg q6hr. Added IV Levaquin.   Hypothermia  started hypothermia protocol. Check random cortisol and TSH   Active Problems:      COPD (chronic obstructive pulmonary disease) Added IV solumedrol. Patient is on home o2.   CHF Last echo 6 months back comments on low EF. patient was admitted recently with CHF exacerbation. Will place on IV lasix 40 mg bid.  Monitor I/O.    CVA (cerebral infarction) Continue ASA per rectum. Patient also on ? Warfarin but INR is subtherapeutic . Doubt complaince.  Afib Patient on cardizem and warfarin. Rate currently controlled .Will place on IV cardizem.     DM (diabetes mellitus) monitor FSG. Place on SSI q 4 hr    DVT prophylaxis: sq lovenox   Diet: NPO    Code Status:full code Family Communication: none Disposition Plan: pending  Eddie North [facility] Pager [phone]  If 7PM-7AM, please contact night-coverage [email or web address] Password [email or web address] 11/29/2012, 1:51 PM    Total time spent: 70 minutes

## 2012-11-29 NOTE — ED Provider Notes (Signed)
CSN: [id number]     Arrival date & time 11/29/12  1032 History  This chart was scribed for Celene Kras, MD by Quintella Reichert, ED scribe.  This patient was seen in room APA01/APA01 and the patient's care was started at 10:32 AM.  Chief Complaint  Patient presents with  . Respiratory Distress    The history is provided by the EMS personnel. No language interpreter was used.    Level 5 Caveat: Severe Respiratory Distress  HPI Comments: Sean Warren is a 57 y.o. male with h/o chronic respiratory failure with hypoxia, COPD, chronic diastolic heart failure, DM, HTN, and stroke brought in by EMS to the Emergency Department complaining of severe respiratory distress that began sometime last night.  EMS was called to pt's residence last night at 10pm for SOB and pt refused transport at that time.    EMS was again called this morning when someone found him lying on the floor.  Pt was lethargic and non-communicative when they arrived.  O2 saturation at that time was 86% on 2-L [place].  EMS attempted to give CPAP but pt refused and took off the mask.  Pt denied pain but EMS notes pt was only able to say "no."  CBG en route was 245. The patient is somnolent and mumbles when he responds to questions. It is difficult to understand him.   Past Medical History  Diagnosis Date  . Hypertension   . Diabetes mellitus   . Bipolar 1 disorder   . Hyperlipidemia   . Atrial fibrillation   . COPD (chronic obstructive pulmonary disease)   . Obstructive sleep apnea   . Chronic diastolic heart failure     Echocardiogram 05/14/11: Mild LVH, EF 50-55%, no wall motion abnormalities, grade 2 diastolic dysfunction.  . Chest pain     Myoview 05/17/11 demonstrated an EF of 61%, small fixed inferoseptal defect consistent with thinning vs prior infarct; no ischemia. - low risk  . Diabetic neuropathy   . Chronic back pain   . History of stroke   . Mental disorder     bipolar  . CHF (congestive heart failure)   . Stroke    . Neuromuscular disorder     diabetic neuropathy  . Chronic respiratory failure with hypoxia 08/08/2012  . Noncompliance with medication regimen   . Tobacco abuse     Past Surgical History  Procedure Laterality Date  . No past surgeries      No family history on file.   History  Substance Use Topics  . Smoking status: Current Every Day Smoker -- 1.50 packs/day for 29 years    Types: Cigarettes  . Smokeless tobacco: Never Used  . Alcohol Use: No     Review of Systems  Unable to perform ROS: Severe respiratory distress    Allergies  Flexeril and Muscle relief  Home Medications   Current Outpatient Rx  Name  Route  Sig  Dispense  Refill  . Aclidinium Bromide (TUDORZA PRESSAIR) 400 MCG/ACT AEPB   Inhalation   Inhale 1 puff into the lungs daily.         Marland Kitchen albuterol (PROVENTIL HFA;VENTOLIN HFA) 108 (90 BASE) MCG/ACT inhaler   Inhalation   Inhale 2 puffs into the lungs every 6 (six) hours as needed. For shortness of breath         . albuterol (PROVENTIL) (2.5 MG/3ML) 0.083% nebulizer solution   Nebulization   Take 2.5 mg by nebulization 3 (three) times daily as needed. For  shortness of breath         . aspirin EC 81 MG tablet   Oral   Take 81 mg by mouth daily.         . budesonide-formoterol (SYMBICORT) 160-4.5 MCG/ACT inhaler   Inhalation   Inhale 2 puffs into the lungs 2 (two) times daily.         . clonazePAM (KLONOPIN) 0.5 MG tablet   Oral   Take 0.5 mg by mouth 2 (two) times daily.         . cloNIDine (CATAPRES) 0.2 MG tablet   Oral   Take 1 tablet (0.2 mg total) by mouth at bedtime.         . diazepam (VALIUM) 10 MG tablet   Oral   Take 1 tablet by mouth 2 (two) times daily as needed for anxiety or sleep.          Marland Kitchen diltiazem (CARDIZEM SR) 120 MG 12 hr capsule   Oral   Take 1 capsule (120 mg total) by mouth every 12 (twelve) hours. NEW MEDICATION TO CONTROL YOUR HEART RATE.   60 capsule   2   . furosemide (LASIX) 40 MG tablet    Oral   Take 80 mg by mouth daily.         Marland Kitchen gabapentin (NEURONTIN) 600 MG tablet   Oral   Take 600 mg by mouth 3 (three) times daily.         Marland Kitchen glimepiride (AMARYL) 2 MG tablet   Oral   Take 2 mg by mouth daily before breakfast.         . HYDROcodone-acetaminophen (NORCO) 10-325 MG per tablet   Oral   Take 1 tablet by mouth every 4 (four) hours as needed. For pain         . lisinopril (PRINIVIL,ZESTRIL) 40 MG tablet   Oral   Take 0.5 tablets (20 mg total) by mouth daily.         Marland Kitchen lithium carbonate (LITHOBID) 300 MG CR tablet   Oral   Take 300 mg by mouth 2 (two) times daily.         . nitroGLYCERIN (NITROSTAT) 0.4 MG SL tablet   Sublingual   Place 0.4 mg under the tongue every 5 (five) minutes as needed. For chest pain         . omeprazole (PRILOSEC) 20 MG capsule   Oral   Take 20 mg by mouth daily.         . ondansetron (ZOFRAN ODT) 8 MG disintegrating tablet   Oral   Take 1 tablet (8 mg total) by mouth every 8 (eight) hours as needed for nausea.   10 tablet   0   . potassium chloride (K-DUR) 10 MEQ tablet   Oral   Take 20 mEq by mouth daily.         . propranolol ER (INDERAL LA) 60 MG 24 hr capsule   Oral   Take 60 mg by mouth daily.         . QUEtiapine (SEROQUEL) 300 MG tablet   Oral   Take 150 mg by mouth at bedtime.         . simvastatin (ZOCOR) 80 MG tablet   Oral   Take 80 mg by mouth at bedtime.         Marland Kitchen warfarin (COUMADIN) 5 MG tablet   Oral   Take 2.5-5 mg by mouth daily. Take 5mg  on day 1, 2.5mg  on  day 2, 5mg  on day 3, and continue accordingly          BP 112/62  Pulse 96  SpO2 90%  Physical Exam  Nursing note and vitals reviewed. Constitutional: He appears listless. He appears ill.  Disheveled, smells of urine  HENT:  Head: Normocephalic and atraumatic.  Right Ear: External ear normal.  Left Ear: External ear normal.  Copious nasal secretions  Eyes: Conjunctivae are normal. Right eye exhibits no  discharge. Left eye exhibits no discharge. No scleral icterus.  Neck: Neck supple. No tracheal deviation present.  Cardiovascular: Normal rate and intact distal pulses.  An irregularly irregular rhythm present.  Pulmonary/Chest: No stridor. He is in respiratory distress. He has decreased breath sounds. He has wheezes. He has no rales.  Abdominal: Soft. Bowel sounds are normal. He exhibits no distension. There is no tenderness. There is no rebound and no guarding.  Musculoskeletal: He exhibits edema. He exhibits no tenderness.  Neurological: He appears listless. No sensory deficit. Cranial nerve deficit:  no gross defecits noted. He exhibits normal muscle tone. He displays no seizure activity. GCS eye subscore is 3. GCS verbal subscore is 4. GCS motor subscore is 6.  Skin: Skin is dry. No rash noted.  No cyanosis, extremities are cool    ED Course  Angiocath insertion Date/Time: 11/29/2012 11:04 AM Performed by: Linwood Dibbles R Authorized by: Linwood Dibbles R Consent: The procedure was performed in an emergent situation. Preparation: Patient was prepped and draped in the usual sterile fashion. Local anesthesia used: no Patient sedated: no Patient tolerance: Patient tolerated the procedure well with no immediate complications. Comments: 20 gauge angiocath inserted right AC under ultrasound guidance.  Good blood draw.  Easily flushed.  CRITICAL CARE Performed by: Celene Kras Authorized by: Linwood Dibbles R Critical care start time: 11/29/2012 10:50 AM Critical care time was exclusive of separately billable procedures and treating other patients. Critical care was necessary to treat or prevent imminent or life-threatening deterioration of the following conditions: respiratory failure. Critical care was time spent personally by me on the following activities: blood draw for specimens, evaluation of patient's response to treatment, examination of patient, ordering and performing treatments and  interventions, ordering and review of laboratory studies, ordering and review of radiographic studies, pulse oximetry and re-evaluation of patient's condition.  INTUBATION Date/Time: 11/29/2012 12:52 PM Performed by: Linwood Dibbles R Authorized by: Linwood Dibbles R Consent: The procedure was performed in an emergent situation. Indications: respiratory failure Intubation method: video-assisted Patient status: paralyzed (RSI) Preoxygenation: BVM Sedatives: etomidate Paralytic: succinylcholine Laryngoscope size: Mac 4 Tube size: 8.0 mm Tube type: cuffed Number of attempts: 1 Cricoid pressure: yes Cords visualized: yes Post-procedure assessment: chest rise and CO2 detector Breath sounds: equal Cuff inflated: yes ETT to lip: 24 cm Tube secured with: ETT holder Chest x-ray interpreted by radiologist. Patient tolerance: Patient tolerated the procedure well with no immediate complications.   1106  patient presents with altered mental status. Patient has history of COPD on exam his wheezing and appears to be in respiratory distress. I suspect he has hypercarbic and this is the cause of his altered mental status. We'll proceed with diagnostic studies. At this time I will start him on BiPAP.  1244  patient's mental status had not improved much. His ABG showed a significant respiratory acidosis.  I intubated the patient due to his hypercapnic respiratory failure and altered mental status. DIAGNOSTIC STUDIES: Oxygen Saturation is 90% on [place], low by my interpretation.  EKG Atrial fibrillation rate 94 Nonspecific intraventricular block Baseline artifact No ST-T wave elevation No prior EKG for comparison Labs Review Labs Reviewed  CBC WITH DIFFERENTIAL - Abnormal; Notable for the following:    MCV 102.8 (*)    Neutrophils Relative % 79 (*)    All other components within normal limits  COMPREHENSIVE METABOLIC PANEL - Abnormal; Notable for the following:    CO2 38 (*)    Glucose, Bld 212 (*)     Albumin 3.3 (*)    Alkaline Phosphatase 120 (*)    GFR calc non Af Amer 73 (*)    GFR calc Af Amer 85 (*)    All other components within normal limits  BLOOD GAS, ARTERIAL - Abnormal; Notable for the following:    pH, Arterial 7.177 (*)    pCO2 arterial 104.0 (*)    pO2, Arterial 61.1 (*)    Bicarbonate 37.0 (*)    Acid-Base Excess 8.7 (*)    All other components within normal limits  GLUCOSE, CAPILLARY - Abnormal; Notable for the following:    Glucose-Capillary 201 (*)    All other components within normal limits  APTT  PROTIME-INR  AMMONIA  LACTIC ACID, PLASMA  ETHANOL  URINALYSIS, ROUTINE W REFLEX MICROSCOPIC  TYPE AND SCREEN   Imaging Review Formal radiologist interpretation pending.  Reviewed by me.  ETT appropriate position.  MDM   1. Acute-on-chronic respiratory failure   2. Hypercapnia   3. Altered mental status   4. COPD (chronic obstructive pulmonary disease)      I personally performed the services described in this documentation, which was scribed in my presence.  The recorded information has been reviewed and is accurate.   Celene Kras, MD 11/29/12 1257

## 2012-11-30 ENCOUNTER — Inpatient Hospital Stay (HOSPITAL_COMMUNITY): Payer: Medicare Other

## 2012-11-30 DIAGNOSIS — I509 Heart failure, unspecified: Secondary | ICD-10-CM

## 2012-11-30 DIAGNOSIS — I5033 Acute on chronic diastolic (congestive) heart failure: Secondary | ICD-10-CM

## 2012-11-30 DIAGNOSIS — R4182 Altered mental status, unspecified: Secondary | ICD-10-CM

## 2012-11-30 LAB — GLUCOSE, CAPILLARY
Glucose-Capillary: 187 mg/dL — ABNORMAL HIGH (ref 70–99)
Glucose-Capillary: 205 mg/dL — ABNORMAL HIGH (ref 70–99)

## 2012-11-30 LAB — BLOOD GAS, ARTERIAL
Acid-Base Excess: 8.7 mmol/L — ABNORMAL HIGH (ref 0.0–2.0)
Bicarbonate: 32.6 mEq/L — ABNORMAL HIGH (ref 20.0–24.0)
FIO2: 40 %
MECHVT: 550 mL
Patient temperature: 37
TCO2: 29.1 mmol/L (ref 0–100)
pH, Arterial: 7.479 — ABNORMAL HIGH (ref 7.350–7.450)

## 2012-11-30 LAB — BASIC METABOLIC PANEL
BUN: 28 mg/dL — ABNORMAL HIGH (ref 6–23)
Calcium: 9.1 mg/dL (ref 8.4–10.5)
Creatinine, Ser: 1.28 mg/dL (ref 0.50–1.35)
GFR calc Af Amer: 69 mL/min — ABNORMAL LOW (ref >90)
GFR calc non Af Amer: 60 mL/min — ABNORMAL LOW (ref >90)

## 2012-11-30 LAB — URINE CULTURE

## 2012-11-30 MED ORDER — QUETIAPINE FUMARATE 25 MG PO TABS
ORAL_TABLET | ORAL | Status: AC
  Filled 2012-11-30: qty 2

## 2012-11-30 MED ORDER — QUETIAPINE FUMARATE 50 MG PO TABS
150.0000 mg | ORAL_TABLET | Freq: Every day | ORAL | Status: DC
  Administered 2012-11-30: 150 mg via ORAL
  Filled 2012-11-30: qty 1

## 2012-11-30 MED ORDER — DILTIAZEM HCL 25 MG/5ML IV SOLN
15.0000 mg | Freq: Four times a day (QID) | INTRAVENOUS | Status: DC
  Administered 2012-11-30 – 2012-12-03 (×12): 15 mg via INTRAVENOUS
  Filled 2012-11-30 (×14): qty 5

## 2012-11-30 MED ORDER — MIDAZOLAM HCL 50 MG/10ML IJ SOLN
INTRAMUSCULAR | Status: AC
  Filled 2012-11-30: qty 1

## 2012-11-30 MED ORDER — GABAPENTIN 600 MG PO TABS
600.0000 mg | ORAL_TABLET | Freq: Three times a day (TID) | ORAL | Status: DC
  Filled 2012-11-30 (×2): qty 1

## 2012-11-30 MED ORDER — SODIUM CHLORIDE 0.9 % IV BOLUS (SEPSIS): 1000.0000 mL | Freq: Once | INTRAVENOUS | Status: DC

## 2012-11-30 MED ORDER — HEPARIN (PORCINE) IN NACL 100-0.45 UNIT/ML-% IJ SOLN
1450.0000 [IU]/h | INTRAMUSCULAR | Status: DC
  Administered 2012-11-30: 1350 [IU]/h via INTRAVENOUS
  Administered 2012-12-01 (×2): 1450 [IU]/h via INTRAVENOUS
  Administered 2012-12-02 – 2012-12-04 (×4): 1350 [IU]/h via INTRAVENOUS
  Administered 2012-12-05: 1450 [IU]/h via INTRAVENOUS
  Filled 2012-11-30 (×8): qty 250

## 2012-11-30 MED ORDER — QUETIAPINE FUMARATE 100 MG PO TABS
ORAL_TABLET | ORAL | Status: AC
  Filled 2012-11-30: qty 1

## 2012-11-30 MED ORDER — ALBUTEROL SULFATE (5 MG/ML) 0.5% IN NEBU
2.5000 mg | INHALATION_SOLUTION | RESPIRATORY_TRACT | Status: DC
  Administered 2012-11-30 – 2012-12-05 (×33): 2.5 mg via RESPIRATORY_TRACT
  Filled 2012-11-30 (×33): qty 0.5

## 2012-11-30 MED ORDER — IPRATROPIUM BROMIDE 0.02 % IN SOLN
0.5000 mg | RESPIRATORY_TRACT | Status: DC
  Administered 2012-11-30 – 2012-12-05 (×33): 0.5 mg via RESPIRATORY_TRACT
  Filled 2012-11-30 (×33): qty 2.5

## 2012-11-30 MED ORDER — GABAPENTIN 250 MG/5ML PO SOLN
600.0000 mg | Freq: Three times a day (TID) | ORAL | Status: DC
  Administered 2012-11-30 – 2012-12-05 (×13): 600 mg via ORAL
  Filled 2012-11-30 (×24): qty 12

## 2012-11-30 NOTE — Progress Notes (Signed)
eLink Physician-Brief Progress Note Patient Name: Sean Warren DOB: 10-25-1955 MRN: [id number]  Date of Service  11/30/2012   HPI/Events of Note     eICU Interventions   Neuronin / Seroquel restarted per home regimen   Intervention Category Major Interventions: Delirium, psychosis, severe agitation - evaluation and management  [name] 11/30/2012, 8:21 PM

## 2012-11-30 NOTE — Progress Notes (Signed)
Patient's room mate, Johny Shears, came to see patient. She states that she has been assisting patient in trying to obtain adult home or nursing home placement. Per Ms [place] patient needs assistance with bathing and dressing. He is unable to cook, clean, or drive to appointments. She also stated that he refuses to wear his CPAP and quite often does not take his medications. She is no longer able to care for patient, and is not even able to transport him to appointments, as he can not get into her truck. She is requesting assistance in placing him. Will notify doctor for referral to Case Management.

## 2012-11-30 NOTE — Consult Note (Signed)
Consult requested by: [facility] hospitalist Consult requested for respiratory failure:  HPI: This is a 57 year old who has a long known history of COPD and chronic respiratory failure and obstructive sleep apnea. He developed increasing shortness of breath several days ago came to the emergency room with respiratory failure and atrial fibrillation and was strongly advised to be admitted but refused and went home. Since then he has had increasing shortness of breath. EMS was called again after he left the emergency department because of increasing shortness of breath and he again refused to come to the hospital. They were finally called again on the morning of admission because he was found in the floor lethargic and noncommunicative. He was brought to the emergency room where he was found to be in acute on chronic respiratory failure. BiPAP was attempted but the patient was not cooperative so he was intubated and placed on mechanical ventilation. He remains intubated .  Past Medical History  Diagnosis Date  . Hypertension   . Diabetes mellitus   . Bipolar 1 disorder   . Hyperlipidemia   . Atrial fibrillation   . COPD (chronic obstructive pulmonary disease)   . Obstructive sleep apnea   . Chronic diastolic heart failure     Echocardiogram 05/14/11: Mild LVH, EF 50-55%, no wall motion abnormalities, grade 2 diastolic dysfunction.  . Chest pain     Myoview 05/17/11 demonstrated an EF of 61%, small fixed inferoseptal defect consistent with thinning vs prior infarct; no ischemia. - low risk  . Diabetic neuropathy   . Chronic back pain   . History of stroke   . Mental disorder     bipolar  . CHF (congestive heart failure)   . Stroke   . Neuromuscular disorder     diabetic neuropathy  . Chronic respiratory failure with hypoxia 08/08/2012  . Noncompliance with medication regimen   . Tobacco abuse      No family history on file.   History   Social History  . Marital Status: Divorced    Spouse  Name: N/A    Number of Children: N/A  . Years of Education: N/A   Social History Main Topics  . Smoking status: Current Every Day Smoker -- 1.50 packs/day for 29 years    Types: Cigarettes  . Smokeless tobacco: Never Used  . Alcohol Use: No  . Drug Use: No  . Sexual Activity: Not Currently   Other Topics Concern  . None   Social History Narrative  . None     ROS: Unobtainable    Objective: Vital signs in last 24 hours: Temp:  [93.1 F (33.9 C)-97.8 F (36.6 C)] 97.8 F (36.6 C) (09/13 1404) Pulse Rate:  [42-266] 72 (09/14 0845) Resp:  [13-27] 18 (09/14 0845) BP: (99-160)/(56-105) 134/63 mmHg (09/14 0845) SpO2:  [90 %-100 %] 98 % (09/14 0857) FiO2 (%):  [40 %-60 %] 40 % (09/14 0857) Weight:  [121.1 kg (266 lb 15.6 oz)-129 kg (284 lb 6.3 oz)] 121.1 kg (266 lb 15.6 oz) (09/14 0500) Weight change:  Last BM Date:  (unknown)  Intake/Output from previous day: 09/13 0701 - 09/14 0700 In: 517.1 [I.V.:367.1; IV Piggyback:150] Out: 2400 [Urine:2050; Emesis/NG output:100]  PHYSICAL EXAM He is intubated and sedated. He is obese. His mucous membranes are slightly dry. His neck is supple. He has orogastric tube in place. His heart shows atrial fibrillation without gallop. His chest shows rhonchi bilaterally. His abdomen is soft obese nondistended and bowel sounds are present. Extremities show pitting  edema which is 1-2+. He has been moving all 4 extremities but is sedated now.  Lab Results: Basic Metabolic Panel:  Recent Labs  16/10/96 1100 11/30/12 0529  NA 142 144  K 4.6 3.8  CL 97 99  CO2 38* 34*  GLUCOSE 212* 207*  BUN 21 28*  CREATININE 1.08 1.28  CALCIUM 9.5 9.1   Liver Function Tests:  Recent Labs  11/29/12 1100  AST 10  ALT 13  ALKPHOS 120*  BILITOT 0.3  PROT 7.5  ALBUMIN 3.3*   No results found for this basename: LIPASE, AMYLASE,  in the last 72 hours  Recent Labs  11/29/12 1101  AMMONIA 20   CBC:  Recent Labs  11/29/12 1100  WBC 8.8   NEUTROABS 7.0  HGB 13.7  HCT 44.1  MCV 102.8*  PLT 197   Cardiac Enzymes: No results found for this basename: CKTOTAL, CKMB, CKMBINDEX, TROPONINI,  in the last 72 hours BNP: No results found for this basename: PROBNP,  in the last 72 hours D-Dimer: No results found for this basename: DDIMER,  in the last 72 hours CBG:  Recent Labs  11/29/12 1121 11/29/12 1657 11/29/12 1953 11/29/12 2339 11/30/12 0436 11/30/12 0734  GLUCAP 201* 124* 158* 163* 187* 215*   Hemoglobin A1C: No results found for this basename: HGBA1C,  in the last 72 hours Fasting Lipid Panel: No results found for this basename: CHOL, HDL, LDLCALC, TRIG, CHOLHDL, LDLDIRECT,  in the last 72 hours Thyroid Function Tests:  Recent Labs  11/29/12 1100  TSH 0.544   Anemia Panel: No results found for this basename: VITAMINB12, FOLATE, FERRITIN, TIBC, IRON, RETICCTPCT,  in the last 72 hours Coagulation:  Recent Labs  11/29/12 1100  LABPROT 13.1  INR 1.01   Urine Drug Screen: Drugs of Abuse     Component Value Date/Time   LABOPIA NONE DETECTED 11/29/2012 1357   LABOPIA NEG 08/26/2007 2017   COCAINSCRNUR NONE DETECTED 11/29/2012 1357   COCAINSCRNUR NEG 08/26/2007 2017   LABBENZ POSITIVE* 11/29/2012 1357   LABBENZ NEG 08/26/2007 2017   AMPHETMU NONE DETECTED 11/29/2012 1357   AMPHETMU NEG 08/26/2007 2017   THCU NONE DETECTED 11/29/2012 1357   LABBARB POSITIVE* 11/29/2012 1357    Alcohol Level:  Recent Labs  11/29/12 1100  ETH <11   Urinalysis:  Recent Labs  11/29/12 1237  COLORURINE YELLOW  LABSPEC >1.030*  PHURINE 5.5  GLUCOSEU NEGATIVE  HGBUR NEGATIVE  BILIRUBINUR SMALL*  KETONESUR TRACE*  PROTEINUR 30*  UROBILINOGEN 0.2  NITRITE NEGATIVE  LEUKOCYTESUR NEGATIVE   Misc. Labs:   ABGS:  Recent Labs  11/30/12 0448  PHART 7.479*  PO2ART 80.1  TCO2 29.1  HCO3 32.6*     MICROBIOLOGY: Recent Results (from the past 240 hour(s))  CULTURE, RESPIRATORY (NON-EXPECTORATED)     Status: None    Collection Time    11/29/12  1:05 PM      Result Value Range Status   Specimen Description TRACHEAL ASPIRATE   Final   Special Requests NONE   Final   Gram Stain PENDING   Incomplete   Culture     Final   Value: Culture reincubated for better growth     Performed at [facility]   Report Status PENDING   Incomplete    Studies/Results: Dg Chest Port 1 View  11/30/2012   CLINICAL DATA:  57 year old male with respiratory failure. Intubated.  EXAM: PORTABLE CHEST - 1 VIEW  COMPARISON:  11/29/2012 and earlier.  FINDINGS: Portable  AP semi upright view at 0739 hr. The patient is more rotated to the right. Stable endotracheal tube. Stable visible enteric tube. Stable cardiomegaly and mediastinal contours. Increased left retrocardiac opacity. Skin fold artifact in the right lung. No pneumothorax. Increased vascular congestion. No overt edema. No large effusion.  IMPRESSION: 1. Rotated to the left. Stable lines and tubes.  2. Increased left lower lobe collapse or consolidation.  3. Increased vascular congestion without overt edema.   Electronically Signed   By: Augusto Gamble M.D.   On: 11/30/2012 07:59   Dg Chest Portable 1 View  11/29/2012   CLINICAL DATA:  Endotracheal tube placement  EXAM: PORTABLE CHEST - 1 VIEW  COMPARISON:  11/29/2012  FINDINGS: Borderline cardiomegaly again noted. No acute infiltrate or pulmonary edema. Stable left basilar atelectasis or scarring. Endotracheal tube in place with tip 4.8 cm above the carinal. NG tube in place. No diagnostic pneumothorax. Mild right basilar atelectasis.  IMPRESSION: No acute infiltrate or pulmonary edema. Stable left basilar atelectasis or scarring. Endotracheal tube in place with tip 4.8 cm above the carinal. NG tube in place. No diagnostic pneumothorax.   Electronically Signed   By: Natasha Mead   On: 11/29/2012 12:17   Dg Chest Port 1 View  11/29/2012   CLINICAL DATA:  57 year old male respiratory distress.  EXAM: PORTABLE CHEST - 1 VIEW   COMPARISON:  11/27/2012 and earlier.  FINDINGS: Portable AP upright view at 1055 hr. Stable cardiomegaly and mediastinal contours. Stable diffuse increased interstitial opacity, not significantly changed from prior studies. No pleural effusion or pneumothorax. No consolidation.  IMPRESSION: Stable cardiomegaly and unchanged increased interstitial opacity which could reflect chronic/recurrent interstitial edema or chronic lung disease.   Electronically Signed   By: Augusto Gamble M.D.   On: 11/29/2012 11:03    Medications:  Scheduled: . ipratropium  0.5 mg Nebulization Q4H   And  . albuterol  2.5 mg Nebulization Q4H  . antiseptic oral rinse  15 mL Mouth Rinse q12n4p  . chlorhexidine  15 mL Mouth/Throat BID  . diltiazem  10 mg Intravenous Q6H  . enoxaparin (LOVENOX) injection  40 mg Subcutaneous Q24H  . furosemide  40 mg Intravenous Q12H  . insulin aspart  0-15 Units Subcutaneous Q4H  . levofloxacin (LEVAQUIN) IV  750 mg Intravenous Q24H  . methylPREDNISolone (SOLU-MEDROL) injection  80 mg Intravenous Q6H  . pantoprazole (PROTONIX) IV  40 mg Intravenous Q24H  . sodium chloride  3 mL Intravenous Q12H   Continuous: . fentaNYL infusion INTRAVENOUS Stopped (11/30/12 0900)  . midazolam (VERSED) infusion 9 mg/hr (11/30/12 0800)   HQI:ONGEXB chloride, acetaminophen, acetaminophen, sodium chloride  Assesment: He has multiple medical problems the most pressing of which is his acute on chronic respiratory failure. He has abnormalities on chest x-ray which could be pneumonia. He has severe COPD. He's had a history of a previous stroke. He has obstructive sleep apnea. He has morbid obesity he has paroxysmal atrial fibrillation chronic diastolic heart failure and diabetes. He is on steroids and antibiotics. Depending on his clinical course is on antibiotics may need to be broadened but I think this probably is community-acquired pneumonia versus chronic atelectasis of the left Principal Problem:    Acute-on-chronic respiratory failure Active Problems:   Obstructive sleep apnea   COPD (chronic obstructive pulmonary disease)   CVA (cerebral infarction)   DM (diabetes mellitus)   CHF (congestive heart failure)   HTN (hypertension)   Paroxysmal a-fib   Chronic respiratory failure with hypoxia  Chronic diastolic heart failure   Hypothermia    Plan: Continue current treatments. I don't think there is any opportunity to try any weaning today but will attempt tomorrow    LOS: 1 day   Caprisha Bridgett L 11/30/2012, 9:45 AM

## 2012-11-30 NOTE — Progress Notes (Signed)
ANTICOAGULATION CONSULT NOTE - Initial Consult  Pharmacy Consult for Heparin Indication: atrial fibrillation  Allergies  Allergen Reactions  . Flexeril [Cyclobenzaprine] Other (See Comments)    Alters Mental Status-ALL MUSCLE RELAXERS  . Muscle Relief [Capsaicin]     Hysteria    Patient Measurements: Height: 5\' 8"  (172.7 cm) Weight: 266 lb 15.6 oz (121.1 kg) IBW/kg (Calculated) : 68.4 Heparin Dosing Weight: 89Kg  Vital Signs: BP: 134/63 mmHg (09/14 0845) Pulse Rate: 72 (09/14 0845)  Labs:  Recent Labs  11/29/12 1100 11/30/12 0529  HGB 13.7  --   HCT 44.1  --   PLT 197  --   APTT 33  --   LABPROT 13.1  --   INR 1.01  --   CREATININE 1.08 1.28   Estimated Creatinine Clearance: 78.7 ml/min (by C-G formula based on Cr of 1.28).  Medical History: Past Medical History  Diagnosis Date  . Hypertension   . Diabetes mellitus   . Bipolar 1 disorder   . Hyperlipidemia   . Atrial fibrillation   . COPD (chronic obstructive pulmonary disease)   . Obstructive sleep apnea   . Chronic diastolic heart failure     Echocardiogram 05/14/11: Mild LVH, EF 50-55%, no wall motion abnormalities, grade 2 diastolic dysfunction.  . Chest pain     Myoview 05/17/11 demonstrated an EF of 61%, small fixed inferoseptal defect consistent with thinning vs prior infarct; no ischemia. - low risk  . Diabetic neuropathy   . Chronic back pain   . History of stroke   . Mental disorder     bipolar  . CHF (congestive heart failure)   . Stroke   . Neuromuscular disorder     diabetic neuropathy  . Chronic respiratory failure with hypoxia 08/08/2012  . Noncompliance with medication regimen   . Tobacco abuse    Medications:  Scheduled:  . ipratropium  0.5 mg Nebulization Q4H   And  . albuterol  2.5 mg Nebulization Q4H  . antiseptic oral rinse  15 mL Mouth Rinse q12n4p  . chlorhexidine  15 mL Mouth/Throat BID  . diltiazem  15 mg Intravenous Q6H  . furosemide  40 mg Intravenous Q12H  . insulin  aspart  0-15 Units Subcutaneous Q4H  . levofloxacin (LEVAQUIN) IV  750 mg Intravenous Q24H  . methylPREDNISolone (SOLU-MEDROL) injection  80 mg Intravenous Q6H  . pantoprazole (PROTONIX) IV  40 mg Intravenous Q24H  . sodium chloride  3 mL Intravenous Q12H    Assessment: 57yo male with h/o afib.  INR is at baseline.  Pt in ICU for respiratory failure.  Initiate and manage heparin for afib, no bolus per MD.   Goal of Therapy:  Heparin level 0.3-0.7 units/ml Monitor platelets by anticoagulation protocol: Yes   Plan:  Heparin at 1350 units/hr Heparin level in 6-8 hrs and then daily Monitor CBC per protocol guidelines.  Margo Aye, Maleek Craver A 11/30/2012,10:25 AM

## 2012-11-30 NOTE — Plan of Care (Signed)
Problem: Phase I Progression Outcomes Goal: Hemodynamically stable Outcome: Progressing Remains in atrial fibrillation with tachycardia

## 2012-11-30 NOTE — Progress Notes (Signed)
[facility] PROGRESS NOTE  Sean Warren WUJ:[id number] DOB: [birth year] DOA: 11/29/2012 PCP: [name], MD   Brief narrative 57 year old obese male with history of A. fib on Coumadin, COPD with chronic respiratory failure, chronic diastolic CHF, obstructive sleep apnea who has been admitted several times in past few months with acute on chronic respiratory failure and CHF symptoms was recently hospitalized for acute respiratory failure. He presented to the ED 2 days back with shortness of breath and appeared to be in mild respiratory failure with A. fib. He was advised to be admitted but he refused and went home. Patient had symptoms of shortness of breath yesterday and EMS was called or when his house last night but patient refused to come to the ED patient EMS was again called this morning when he was found lying in the floor lethargic and noncommunicative. Patient's O2 sat was 86% on 2 L via nasal cannula. Patient was lethargic. He was tried on BiPAP but was refusing to keep it on. arterial blood gas done that showed pH of 7.17, PCO2 of 104, PO2 of 61 and bicarbonate of 37. He was intubated and placed on mechanical ventilator in the ED. A Foley was placed in as well. [facility] called for admission to ICU.  Assessment/Plan:  Principal Problem:  Acute-on-chronic respiratory failure  Patient intubated in ED given hypercarbic respiratory failure with AMS. Admitted to ICU on ventilator. Repeat ABG on vent shows improved Po2 and Pco2. Monitor respoiratory and neurological status. Pulmonary consulted to help with vent management.  symptoms likely in the setting of COPD exacerbation with hypercarbic respiratory failure along with CHF and OSA.  -IV solumedrol 80 mg q6hr. Added IV Levaquin for retrocardiac opaciity seen on CXR. -IV lasix 40  Mg  Bid -keep on ventilator today with plan for weaning trial tomorrow.    Hypothermia  On presentation which shortly resolved. Normal random  cortisol and TSH.   Active Problems:  COPD (chronic obstructive pulmonary disease)  Added IV solumedrol and scheduled duonebs. Patient is on home o2.   CHF  Last echo 6 months back comments on low EF. patient was admitted recently with CHF exacerbation.  placed on IV lasix 40 mg bid. Monitor I/O.   CVA (cerebral infarction)  Continue ASA per rectum. Patient also on  Warfarin but INR is subtherapeutic . Doubt complaince. Will place on IV heparin while pt is NPO.  Afib  Patient on cardizem and warfarin. Currently in RVR. Will increase cardizem dose . Add IV heparin  DM (diabetes mellitus)  monitor FSG. Place on SSI q 4 hr   DVT prophylaxis: IV heparin   HPI/Subjective: remains on vent. Am blood gas reviewed  Objective: Filed Vitals:   11/30/12 0845  BP: 134/63  Pulse: 72  Temp:   Resp: 18    Intake/Output Summary (Last 24 hours) at 11/30/12 0953 Last data filed at 11/30/12 0800  Gross per 24 hour  Intake 569.11 ml  Output   2400 ml  Net -1830.89 ml   Filed Weights   11/29/12 1404 11/30/12 0500  Weight: 129 kg (284 lb 6.3 oz) 121.1 kg (266 lb 15.6 oz)    Exam:  Constitutional: Vital signs reviewed. Patient is a middle aged obese male intubated and on ventilator. Sedated.  HEENT:no pallor , intubated Cardiovascular: RRR, S1 and S2 irregular, no MRG, pulses symmetric and intact bilaterally.  Pulmonary/Chest: CTAB, scattered rhonchi  Abdominal: Soft. Non-tender, non-distended, bowel sounds are normal,  extremities: 1+ pitting edema bilaterally, foley in  place.  CNS: sedated.   Data Reviewed: Basic Metabolic Panel:  Recent Labs Lab 11/27/12 0924 11/29/12 1100 11/30/12 0529  NA 142 142 144  K 4.5 4.6 3.8  CL 102 97 99  CO2 38* 38* 34*  GLUCOSE 150* 212* 207*  BUN 12 21 28*  CREATININE 0.90 1.08 1.28  CALCIUM 9.3 9.5 9.1   Liver Function Tests:  Recent Labs Lab 11/27/12 0924 11/29/12 1100  AST 8 10  ALT 11 13  ALKPHOS 109 120*  BILITOT 0.3 0.3   PROT 6.8 7.5  ALBUMIN 3.0* 3.3*   No results found for this basename: LIPASE, AMYLASE,  in the last 168 hours  Recent Labs Lab 11/29/12 1101  AMMONIA 20   CBC:  Recent Labs Lab 11/27/12 0924 11/29/12 1100  WBC 10.6* 8.8  NEUTROABS 8.7* 7.0  HGB 11.6* 13.7  HCT 37.8* 44.1  MCV 104.4* 102.8*  PLT 215 197   Cardiac Enzymes:  Recent Labs Lab 11/27/12 0924  TROPONINI <0.30   BNP (last 3 results)  Recent Labs  10/31/12 1355 11/17/12 2032 11/27/12 0924  PROBNP 1622.0* 1300.0* 2577.0*   CBG:  Recent Labs Lab 11/29/12 1657 11/29/12 1953 11/29/12 2339 11/30/12 0436 11/30/12 0734  GLUCAP 124* 158* 163* 187* 215*    Recent Results (from the past 240 hour(s))  CULTURE, RESPIRATORY (NON-EXPECTORATED)     Status: None   Collection Time    11/29/12  1:05 PM      Result Value Range Status   Specimen Description TRACHEAL ASPIRATE   Final   Special Requests NONE   Final   Gram Stain PENDING   Incomplete   Culture     Final   Value: Culture reincubated for better growth     Performed at [facility]   Report Status PENDING   Incomplete     Studies: Dg Chest Port 1 View  11/30/2012   CLINICAL DATA:  57 year old male with respiratory failure. Intubated.  EXAM: PORTABLE CHEST - 1 VIEW  COMPARISON:  11/29/2012 and earlier.  FINDINGS: Portable AP semi upright view at 0739 hr. The patient is more rotated to the right. Stable endotracheal tube. Stable visible enteric tube. Stable cardiomegaly and mediastinal contours. Increased left retrocardiac opacity. Skin fold artifact in the right lung. No pneumothorax. Increased vascular congestion. No overt edema. No large effusion.  IMPRESSION: 1. Rotated to the left. Stable lines and tubes.  2. Increased left lower lobe collapse or consolidation.  3. Increased vascular congestion without overt edema.   Electronically Signed   By: Augusto Gamble M.D.   On: 11/30/2012 07:59   Dg Chest Portable 1 View  11/29/2012   CLINICAL DATA:   Endotracheal tube placement  EXAM: PORTABLE CHEST - 1 VIEW  COMPARISON:  11/29/2012  FINDINGS: Borderline cardiomegaly again noted. No acute infiltrate or pulmonary edema. Stable left basilar atelectasis or scarring. Endotracheal tube in place with tip 4.8 cm above the carinal. NG tube in place. No diagnostic pneumothorax. Mild right basilar atelectasis.  IMPRESSION: No acute infiltrate or pulmonary edema. Stable left basilar atelectasis or scarring. Endotracheal tube in place with tip 4.8 cm above the carinal. NG tube in place. No diagnostic pneumothorax.   Electronically Signed   By: Natasha Mead   On: 11/29/2012 12:17   Dg Chest Port 1 View  11/29/2012   CLINICAL DATA:  57 year old male respiratory distress.  EXAM: PORTABLE CHEST - 1 VIEW  COMPARISON:  11/27/2012 and earlier.  FINDINGS: Portable AP upright view  at 1055 hr. Stable cardiomegaly and mediastinal contours. Stable diffuse increased interstitial opacity, not significantly changed from prior studies. No pleural effusion or pneumothorax. No consolidation.  IMPRESSION: Stable cardiomegaly and unchanged increased interstitial opacity which could reflect chronic/recurrent interstitial edema or chronic lung disease.   Electronically Signed   By: Augusto Gamble M.D.   On: 11/29/2012 11:03    Scheduled Meds: . ipratropium  0.5 mg Nebulization Q4H   And  . albuterol  2.5 mg Nebulization Q4H  . antiseptic oral rinse  15 mL Mouth Rinse q12n4p  . chlorhexidine  15 mL Mouth/Throat BID  . diltiazem  10 mg Intravenous Q6H  . enoxaparin (LOVENOX) injection  40 mg Subcutaneous Q24H  . furosemide  40 mg Intravenous Q12H  . insulin aspart  0-15 Units Subcutaneous Q4H  . levofloxacin (LEVAQUIN) IV  750 mg Intravenous Q24H  . methylPREDNISolone (SOLU-MEDROL) injection  80 mg Intravenous Q6H  . pantoprazole (PROTONIX) IV  40 mg Intravenous Q24H  . sodium chloride  3 mL Intravenous Q12H   Continuous Infusions: . fentaNYL infusion INTRAVENOUS Stopped (11/30/12  0900)  . midazolam (VERSED) infusion 9 mg/hr (11/30/12 0800)      Time spent: 35 MINUTES    Sean Warren  [facility] Pager [phone]. If 7PM-7AM, please contact night-coverage at [email or web address], password [facility] 11/30/2012, 9:53 AM  LOS: 1 day

## 2012-12-01 ENCOUNTER — Encounter (HOSPITAL_COMMUNITY): Payer: Self-pay | Admitting: Anesthesiology

## 2012-12-01 ENCOUNTER — Inpatient Hospital Stay (HOSPITAL_COMMUNITY): Payer: Medicare Other

## 2012-12-01 ENCOUNTER — Inpatient Hospital Stay (HOSPITAL_COMMUNITY): Payer: Medicare Other | Admitting: Anesthesiology

## 2012-12-01 DIAGNOSIS — E876 Hypokalemia: Secondary | ICD-10-CM

## 2012-12-01 LAB — CBC
HCT: 35.7 % — ABNORMAL LOW (ref 39.0–52.0)
MCH: 31.2 pg (ref 26.0–34.0)
MCHC: 31.4 g/dL (ref 30.0–36.0)
MCV: 99.4 fL (ref 78.0–100.0)
RDW: 13.4 % (ref 11.5–15.5)

## 2012-12-01 LAB — BLOOD GAS, ARTERIAL
Acid-Base Excess: 9.7 mmol/L — ABNORMAL HIGH (ref 0.0–2.0)
Delivery systems: POSITIVE
Drawn by: 23534
Drawn by: 213101
Expiratory PAP: 5
FIO2: 0.5 %
Inspiratory PAP: 19
MECHVT: 550 mL
O2 Saturation: 97.3 %
PEEP: 5 cmH2O
Patient temperature: 37
Patient temperature: 37
RATE: 14 resp/min
pCO2 arterial: 59 mmHg (ref 35.0–45.0)
pH, Arterial: 7.416 (ref 7.350–7.450)

## 2012-12-01 LAB — BASIC METABOLIC PANEL
BUN: 34 mg/dL — ABNORMAL HIGH (ref 6–23)
Calcium: 9.1 mg/dL (ref 8.4–10.5)
Creatinine, Ser: 1.39 mg/dL — ABNORMAL HIGH (ref 0.50–1.35)
GFR calc Af Amer: 63 mL/min — ABNORMAL LOW (ref >90)
GFR calc non Af Amer: 54 mL/min — ABNORMAL LOW (ref >90)
Glucose, Bld: 242 mg/dL — ABNORMAL HIGH (ref 70–99)
Potassium: 3.3 mEq/L — ABNORMAL LOW (ref 3.5–5.1)

## 2012-12-01 LAB — GLUCOSE, CAPILLARY
Glucose-Capillary: 212 mg/dL — ABNORMAL HIGH (ref 70–99)
Glucose-Capillary: 251 mg/dL — ABNORMAL HIGH (ref 70–99)
Glucose-Capillary: 220 mg/dL — ABNORMAL HIGH (ref 70–99)
Glucose-Capillary: 170 mg/dL — ABNORMAL HIGH (ref 70–99)

## 2012-12-01 MED ORDER — LORAZEPAM 2 MG/ML IJ SOLN
INTRAMUSCULAR | Status: AC
  Administered 2012-12-01: 1 mg via INTRAVENOUS
  Filled 2012-12-01: qty 1

## 2012-12-01 MED ORDER — PROPOFOL 10 MG/ML IV EMUL
INTRAVENOUS | Status: AC
  Filled 2012-12-01: qty 100

## 2012-12-01 MED ORDER — FENTANYL CITRATE 0.05 MG/ML IJ SOLN
INTRAMUSCULAR | Status: AC
  Filled 2012-12-01: qty 50

## 2012-12-01 MED ORDER — FENTANYL BOLUS VIA INFUSION
25.0000 ug | Freq: Four times a day (QID) | INTRAVENOUS | Status: DC | PRN
  Filled 2012-12-01: qty 100

## 2012-12-01 MED ORDER — ETOMIDATE 2 MG/ML IV SOLN
INTRAVENOUS | Status: DC | PRN
  Administered 2012-12-01: 14 mg via INTRAVENOUS

## 2012-12-01 MED ORDER — BIOTENE DRY MOUTH MT LIQD
15.0000 mL | Freq: Four times a day (QID) | OROMUCOSAL | Status: DC
  Administered 2012-12-01 – 2012-12-05 (×16): 15 mL via OROMUCOSAL

## 2012-12-01 MED ORDER — ETOMIDATE 2 MG/ML IV SOLN
INTRAVENOUS | Status: AC
  Filled 2012-12-01: qty 20

## 2012-12-01 MED ORDER — SUCCINYLCHOLINE CHLORIDE 20 MG/ML IJ SOLN
INTRAMUSCULAR | Status: DC | PRN
  Administered 2012-12-01: 140 mg via INTRAVENOUS

## 2012-12-01 MED ORDER — CHLORHEXIDINE GLUCONATE 0.12 % MT SOLN
15.0000 mL | Freq: Two times a day (BID) | OROMUCOSAL | Status: DC
  Administered 2012-12-01 – 2012-12-05 (×8): 15 mL via OROMUCOSAL
  Filled 2012-12-01 (×8): qty 15

## 2012-12-01 MED ORDER — LORAZEPAM 2 MG/ML IJ SOLN
1.0000 mg | INTRAMUSCULAR | Status: DC | PRN
  Administered 2012-12-01: 1 mg via INTRAVENOUS
  Filled 2012-12-01 (×2): qty 1

## 2012-12-01 MED ORDER — ROCURONIUM BROMIDE 50 MG/5ML IV SOLN
INTRAVENOUS | Status: AC
  Filled 2012-12-01: qty 2

## 2012-12-01 MED ORDER — POTASSIUM CHLORIDE 10 MEQ/100ML IV SOLN: 10.0000 meq | INTRAVENOUS | Status: DC

## 2012-12-01 MED ORDER — POTASSIUM CHLORIDE 10 MEQ/100ML IV SOLN
INTRAVENOUS | Status: AC
  Administered 2012-12-01: 10 meq via INTRAVENOUS
  Filled 2012-12-01: qty 400

## 2012-12-01 MED ORDER — SUCCINYLCHOLINE CHLORIDE 20 MG/ML IJ SOLN
INTRAMUSCULAR | Status: AC
  Filled 2012-12-01: qty 1

## 2012-12-01 MED ORDER — DILTIAZEM HCL 25 MG/5ML IV SOLN
20.0000 mg | Freq: Once | INTRAVENOUS | Status: AC
  Administered 2012-12-01: 20 mg via INTRAVENOUS

## 2012-12-01 MED ORDER — LORAZEPAM 2 MG/ML IJ SOLN
1.0000 mg | Freq: Once | INTRAMUSCULAR | Status: AC
  Administered 2012-12-01: 1 mg via INTRAVENOUS

## 2012-12-01 MED ORDER — QUETIAPINE FUMARATE 100 MG PO TABS
150.0000 mg | ORAL_TABLET | Freq: Every day | ORAL | Status: DC
  Administered 2012-12-02: 150 mg via ORAL
  Filled 2012-12-01 (×4): qty 1.5

## 2012-12-01 MED ORDER — SODIUM CHLORIDE 0.9 % IV SOLN
25.0000 ug/h | INTRAVENOUS | Status: DC
  Administered 2012-12-01: 40 ug/h via INTRAVENOUS
  Administered 2012-12-03: 100 ug/h via INTRAVENOUS
  Administered 2012-12-05: 150 ug/h via INTRAVENOUS
  Filled 2012-12-01 (×4): qty 50

## 2012-12-01 MED ORDER — MIDAZOLAM HCL 50 MG/10ML IJ SOLN
INTRAMUSCULAR | Status: AC
  Filled 2012-12-01: qty 1

## 2012-12-01 MED ORDER — PROPOFOL 10 MG/ML IV EMUL
5.0000 ug/kg/min | INTRAVENOUS | Status: DC
  Administered 2012-12-01: 10 ug/kg/min via INTRAVENOUS
  Administered 2012-12-02: 15 ug/kg/min via INTRAVENOUS
  Administered 2012-12-02: 50 ug/kg/min via INTRAVENOUS
  Administered 2012-12-02: 40 ug/kg/min via INTRAVENOUS
  Administered 2012-12-02: 20 ug/kg/min via INTRAVENOUS
  Administered 2012-12-02: 50 ug/kg/min via INTRAVENOUS
  Administered 2012-12-02: 40 ug/kg/min via INTRAVENOUS
  Administered 2012-12-02 – 2012-12-03 (×4): 50 ug/kg/min via INTRAVENOUS
  Administered 2012-12-03 (×2): 49.959 ug/kg/min via INTRAVENOUS
  Administered 2012-12-03 (×3): 50 ug/kg/min via INTRAVENOUS
  Administered 2012-12-04: 49.959 ug/kg/min via INTRAVENOUS
  Administered 2012-12-04: 10 ug/kg/min via INTRAVENOUS
  Administered 2012-12-04: 50 ug/kg/min via INTRAVENOUS
  Administered 2012-12-04 (×4): 49.959 ug/kg/min via INTRAVENOUS
  Administered 2012-12-05: 25 ug/kg/min via INTRAVENOUS
  Administered 2012-12-05 (×3): 50 ug/kg/min via INTRAVENOUS
  Filled 2012-12-01 (×30): qty 100

## 2012-12-01 MED ORDER — LIDOCAINE HCL (CARDIAC) 20 MG/ML IV SOLN
INTRAVENOUS | Status: AC
  Filled 2012-12-01: qty 5

## 2012-12-01 MED ORDER — POTASSIUM CHLORIDE 10 MEQ/100ML IV SOLN
10.0000 meq | INTRAVENOUS | Status: AC
  Administered 2012-12-01 (×4): 10 meq via INTRAVENOUS

## 2012-12-01 NOTE — Progress Notes (Signed)
Inpatient Diabetes Program Recommendations  AACE/ADA: New Consensus Statement on Inpatient Glycemic Control (2013)  Target Ranges:  Prepandial:   less than 140 mg/dL      Peak postprandial:   less than 180 mg/dL (1-2 hours)      Critically ill patients:  140 - 180 mg/dL   Results for Sean Warren, Sean Warren (MRN [id number]) as of 12/01/2012 09:55  Ref. Range 11/30/2012 04:36 11/30/2012 07:34 11/30/2012 11:14 11/30/2012 16:28 11/30/2012 19:41 11/30/2012 23:42 12/01/2012 03:54 12/01/2012 07:31  Glucose-Capillary Latest Range: 70-99 mg/dL 784 (H) 696 (H) 295 (H) 198 (H) 192 (H) 195 (H) 212 (H) 251 (H)    Inpatient Diabetes Program Recommendations Correction (SSI): Please increase Novolog correction to resistant scale Q4H.  Note: Patient has a history of diabetes and takes Amaryl 2 mg QAM as an outpatient for diabetes management.  Currently, patient is ordered to receive Novolog 0-15 units Q4H for inpatient glycemic control.  Patient is ordered to receive Solumedrol 80 mg Q6H which is contributing to hyperglycemia.  Please increase Novolog correction to resistant scale Q4H.  Will continue to follow.  Thanks, Orlando Penner, RN, MSN, CCRN Diabetes Coordinator Inpatient Diabetes Program [phone] (Team Pager) [phone] (AP office) [phone] [facility] office)

## 2012-12-01 NOTE — Clinical Social Work Note (Signed)
CSW reviewed chart, spoke w RN.  Patient currently sedated and unable to participate in assessment for possible SNF placement.  Will defer assessment until patient is medically ready.    Santa Genera, LCSW Clinical Social Worker [phone])

## 2012-12-01 NOTE — Progress Notes (Signed)
Subjective: He apparently became quite agitated. He is back on his Neurontin and Seroquel. No other new issues have been seen.  Objective: Vital signs in last 24 hours: Temp:  [97.8 F (36.6 C)-98.6 F (37 C)] 97.8 F (36.6 C) (09/15 0733) Pulse Rate:  [57-129] 85 (09/15 0729) Resp:  [0-30] 16 (09/15 0729) BP: (116-195)/(53-138) 139/80 mmHg (09/15 0630) SpO2:  [91 %-100 %] 95 % (09/15 0729) FiO2 (%):  [40 %] 40 % (09/15 0729) Weight:  [122.1 kg (269 lb 2.9 oz)] 122.1 kg (269 lb 2.9 oz) (09/15 0500) Weight change: -6.9 kg (-15 lb 3.4 oz) Last BM Date:  (unknown, patient unable to answer)  Intake/Output from previous day: 09/14 0701 - 09/15 0700 In: 1324.1 [I.V.:1174.1; IV Piggyback:150] Out: 1775 [Urine:1775]  PHYSICAL EXAM General appearance: morbidly obese and He is intubated on mechanical ventilation and sedated Resp: rhonchi bilaterally Cardio: irregularly irregular rhythm GI: soft, non-tender; bowel sounds normal; no masses,  no organomegaly Extremities: extremities normal, atraumatic, no cyanosis or edema  Lab Results:    Basic Metabolic Panel:  Recent Labs  95/62/13 0529 12/01/12 0428  NA 144 146*  K 3.8 3.3*  CL 99 101  CO2 34* 35*  GLUCOSE 207* 242*  BUN 28* 34*  CREATININE 1.28 1.39*  CALCIUM 9.1 9.1   Liver Function Tests:  Recent Labs  11/29/12 1100  AST 10  ALT 13  ALKPHOS 120*  BILITOT 0.3  PROT 7.5  ALBUMIN 3.3*   No results found for this basename: LIPASE, AMYLASE,  in the last 72 hours  Recent Labs  11/29/12 1101  AMMONIA 20   CBC:  Recent Labs  11/29/12 1100 12/01/12 0428  WBC 8.8 7.1  NEUTROABS 7.0  --   HGB 13.7 11.2*  HCT 44.1 35.7*  MCV 102.8* 99.4  PLT 197 223   Cardiac Enzymes: No results found for this basename: CKTOTAL, CKMB, CKMBINDEX, TROPONINI,  in the last 72 hours BNP: No results found for this basename: PROBNP,  in the last 72 hours D-Dimer: No results found for this basename: DDIMER,  in the last 72  hours CBG:  Recent Labs  11/30/12 1114 11/30/12 1628 11/30/12 1941 11/30/12 2342 12/01/12 0354 12/01/12 0731  GLUCAP 205* 198* 192* 195* 212* 251*   Hemoglobin A1C: No results found for this basename: HGBA1C,  in the last 72 hours Fasting Lipid Panel: No results found for this basename: CHOL, HDL, LDLCALC, TRIG, CHOLHDL, LDLDIRECT,  in the last 72 hours Thyroid Function Tests:  Recent Labs  11/29/12 1100  TSH 0.544   Anemia Panel: No results found for this basename: VITAMINB12, FOLATE, FERRITIN, TIBC, IRON, RETICCTPCT,  in the last 72 hours Coagulation:  Recent Labs  11/29/12 1100  LABPROT 13.1  INR 1.01   Urine Drug Screen: Drugs of Abuse     Component Value Date/Time   LABOPIA NONE DETECTED 11/29/2012 1357   LABOPIA NEG 08/26/2007 2017   COCAINSCRNUR NONE DETECTED 11/29/2012 1357   COCAINSCRNUR NEG 08/26/2007 2017   LABBENZ POSITIVE* 11/29/2012 1357   LABBENZ NEG 08/26/2007 2017   AMPHETMU NONE DETECTED 11/29/2012 1357   AMPHETMU NEG 08/26/2007 2017   THCU NONE DETECTED 11/29/2012 1357   LABBARB POSITIVE* 11/29/2012 1357    Alcohol Level:  Recent Labs  11/29/12 1100  ETH <11   Urinalysis:  Recent Labs  11/29/12 1237  COLORURINE YELLOW  LABSPEC >1.030*  PHURINE 5.5  GLUCOSEU NEGATIVE  HGBUR NEGATIVE  BILIRUBINUR SMALL*  KETONESUR TRACE*  PROTEINUR 30*  UROBILINOGEN 0.2  NITRITE NEGATIVE  LEUKOCYTESUR NEGATIVE   Misc. Labs:  ABGS  Recent Labs  11/30/12 0448  PHART 7.479*  PO2ART 80.1  TCO2 29.1  HCO3 32.6*   CULTURES Recent Results (from the past 240 hour(s))  URINE CULTURE     Status: None   Collection Time    11/29/12 12:37 PM      Result Value Range Status   Specimen Description URINE, CATHETERIZED   Final   Special Requests NONE   Final   Culture  Setup Time     Final   Value: 11/29/2012 21:50     Performed at [facility] Count     Final   Value: NO GROWTH     Performed at [facility]   Culture      Final   Value: NO GROWTH     Performed at [facility]   Report Status 11/30/2012 FINAL   Final  CULTURE, RESPIRATORY (NON-EXPECTORATED)     Status: None   Collection Time    11/29/12  1:05 PM      Result Value Range Status   Specimen Description TRACHEAL ASPIRATE   Final   Special Requests NONE   Final   Gram Stain PENDING   Incomplete   Culture     Final   Value: Culture reincubated for better growth     Performed at [facility]   Report Status PENDING   Incomplete   Studies/Results: Dg Chest Port 1 View  11/30/2012   CLINICAL DATA:  57 year old male with respiratory failure. Intubated.  EXAM: PORTABLE CHEST - 1 VIEW  COMPARISON:  11/29/2012 and earlier.  FINDINGS: Portable AP semi upright view at 0739 hr. The patient is more rotated to the right. Stable endotracheal tube. Stable visible enteric tube. Stable cardiomegaly and mediastinal contours. Increased left retrocardiac opacity. Skin fold artifact in the right lung. No pneumothorax. Increased vascular congestion. No overt edema. No large effusion.  IMPRESSION: 1. Rotated to the left. Stable lines and tubes.  2. Increased left lower lobe collapse or consolidation.  3. Increased vascular congestion without overt edema.   Electronically Signed   By: Augusto Gamble M.D.   On: 11/30/2012 07:59   Dg Chest Portable 1 View  11/29/2012   CLINICAL DATA:  Endotracheal tube placement  EXAM: PORTABLE CHEST - 1 VIEW  COMPARISON:  11/29/2012  FINDINGS: Borderline cardiomegaly again noted. No acute infiltrate or pulmonary edema. Stable left basilar atelectasis or scarring. Endotracheal tube in place with tip 4.8 cm above the carinal. NG tube in place. No diagnostic pneumothorax. Mild right basilar atelectasis.  IMPRESSION: No acute infiltrate or pulmonary edema. Stable left basilar atelectasis or scarring. Endotracheal tube in place with tip 4.8 cm above the carinal. NG tube in place. No diagnostic pneumothorax.   Electronically Signed   By:  Natasha Mead   On: 11/29/2012 12:17   Dg Chest Port 1 View  11/29/2012   CLINICAL DATA:  57 year old male respiratory distress.  EXAM: PORTABLE CHEST - 1 VIEW  COMPARISON:  11/27/2012 and earlier.  FINDINGS: Portable AP upright view at 1055 hr. Stable cardiomegaly and mediastinal contours. Stable diffuse increased interstitial opacity, not significantly changed from prior studies. No pleural effusion or pneumothorax. No consolidation.  IMPRESSION: Stable cardiomegaly and unchanged increased interstitial opacity which could reflect chronic/recurrent interstitial edema or chronic lung disease.   Electronically Signed   By: Augusto Gamble M.D.   On: 11/29/2012 11:03  Medications:  Prior to Admission:  Prescriptions prior to admission  Medication Sig Dispense Refill  . Aclidinium Bromide (TUDORZA PRESSAIR) 400 MCG/ACT AEPB Inhale 1 puff into the lungs daily.      Marland Kitchen albuterol (PROVENTIL HFA;VENTOLIN HFA) 108 (90 BASE) MCG/ACT inhaler Inhale 2 puffs into the lungs every 6 (six) hours as needed. For shortness of breath      . albuterol (PROVENTIL) (2.5 MG/3ML) 0.083% nebulizer solution Take 2.5 mg by nebulization 3 (three) times daily as needed. For shortness of breath      . aspirin EC 81 MG tablet Take 81 mg by mouth daily.      . budesonide-formoterol (SYMBICORT) 160-4.5 MCG/ACT inhaler Inhale 2 puffs into the lungs 2 (two) times daily.      . clonazePAM (KLONOPIN) 0.5 MG tablet Take 0.5 mg by mouth 2 (two) times daily.      . cloNIDine (CATAPRES) 0.2 MG tablet Take 1 tablet (0.2 mg total) by mouth at bedtime.      . diazepam (VALIUM) 10 MG tablet Take 1 tablet by mouth 2 (two) times daily as needed for anxiety or sleep.       Marland Kitchen diltiazem (CARDIZEM SR) 120 MG 12 hr capsule Take 1 capsule (120 mg total) by mouth every 12 (twelve) hours. NEW MEDICATION TO CONTROL YOUR HEART RATE.  60 capsule  2  . furosemide (LASIX) 40 MG tablet Take 80 mg by mouth daily.      Marland Kitchen gabapentin (NEURONTIN) 600 MG tablet Take 600  mg by mouth 3 (three) times daily.      Marland Kitchen glimepiride (AMARYL) 2 MG tablet Take 2 mg by mouth daily before breakfast.      . HYDROcodone-acetaminophen (NORCO) 10-325 MG per tablet Take 1 tablet by mouth every 4 (four) hours as needed. For pain      . lisinopril (PRINIVIL,ZESTRIL) 40 MG tablet Take 0.5 tablets (20 mg total) by mouth daily.      Marland Kitchen lithium carbonate (LITHOBID) 300 MG CR tablet Take 300 mg by mouth 2 (two) times daily.      . nitroGLYCERIN (NITROSTAT) 0.4 MG SL tablet Place 0.4 mg under the tongue every 5 (five) minutes as needed. For chest pain      . omeprazole (PRILOSEC) 20 MG capsule Take 20 mg by mouth daily.      . ondansetron (ZOFRAN ODT) 8 MG disintegrating tablet Take 1 tablet (8 mg total) by mouth every 8 (eight) hours as needed for nausea.  10 tablet  0  . potassium chloride (K-DUR) 10 MEQ tablet Take 20 mEq by mouth daily.      . propranolol ER (INDERAL LA) 60 MG 24 hr capsule Take 60 mg by mouth daily.      . QUEtiapine (SEROQUEL) 300 MG tablet Take 150 mg by mouth at bedtime.      . simvastatin (ZOCOR) 80 MG tablet Take 80 mg by mouth at bedtime.      Marland Kitchen warfarin (COUMADIN) 5 MG tablet Take 2.5-5 mg by mouth daily. Take 5mg  on day 1, 2.5mg  on day 2, 5mg  on day 3, and continue accordingly       Scheduled: . ipratropium  0.5 mg Nebulization Q4H   And  . albuterol  2.5 mg Nebulization Q4H  . antiseptic oral rinse  15 mL Mouth Rinse q12n4p  . chlorhexidine  15 mL Mouth/Throat BID  . diltiazem  15 mg Intravenous Q6H  . furosemide  40 mg Intravenous Q12H  . gabapentin  600 mg Oral Q8H  . insulin aspart  0-15 Units Subcutaneous Q4H  . levofloxacin (LEVAQUIN) IV  750 mg Intravenous Q24H  . methylPREDNISolone (SOLU-MEDROL) injection  80 mg Intravenous Q6H  . pantoprazole (PROTONIX) IV  40 mg Intravenous Q24H  . potassium chloride  10 mEq Intravenous Q1 Hr x 4  . QUEtiapine  150 mg Oral QHS  . sodium chloride  3 mL Intravenous Q12H   Continuous: . fentaNYL infusion  INTRAVENOUS 200 mcg/hr (12/01/12 0639)  . heparin 1,450 Units/hr (12/01/12 1610)  . midazolam (VERSED) infusion 7 mg/hr (12/01/12 9604)   VWU:[id number] chloride, acetaminophen, acetaminophen, sodium chloride  Assesment: He has acute on chronic respiratory failure with COPD exacerbation. He has abnormalities on chest x-ray that may be community-acquired pneumonia but may be chronic. He has had a CVA in the past. He has diabetes. He has congestive heart failure and hypertension. He has obstructive sleep apnea. He has a history of atrial fibrillation. He has significant psychiatric issues in his meds have been restarted Principal Problem:   Acute-on-chronic respiratory failure Active Problems:   Obstructive sleep apnea   COPD (chronic obstructive pulmonary disease)   CVA (cerebral infarction)   DM (diabetes mellitus)   CHF (congestive heart failure)   HTN (hypertension)   Paroxysmal a-fib   Chronic respiratory failure with hypoxia   Chronic diastolic heart failure   Hypothermia    Plan: Attempt weaning today    LOS: 2 days   Danett Palazzo L 12/01/2012, 7:51 AM

## 2012-12-01 NOTE — Progress Notes (Signed)
ANTICOAGULATION CONSULT NOTE   Pharmacy Consult for Heparin Indication: atrial fibrillation  Allergies  Allergen Reactions  . Flexeril [Cyclobenzaprine] Other (See Comments)    Alters Mental Status-ALL MUSCLE RELAXERS  . Muscle Relief [Capsaicin]     Hysteria    Patient Measurements: Height: 5\' 8"  (172.7 cm) Weight: 269 lb 2.9 oz (122.1 kg) IBW/kg (Calculated) : 68.4 Heparin Dosing Weight: 89Kg  Vital Signs: Temp: 97.8 F (36.6 C) (09/15 0733) Temp src: Core (Comment) (09/15 0733) BP: 149/84 mmHg (09/15 0800) Pulse Rate: 83 (09/15 0800)  Labs:  Recent Labs  11/29/12 1100 11/30/12 0529 11/30/12 1812 12/01/12 0428  HGB 13.7  --   --  11.2*  HCT 44.1  --   --  35.7*  PLT 197  --   --  223  APTT 33  --   --   --   LABPROT 13.1  --   --   --   INR 1.01  --   --   --   HEPARINUNFRC  --   --  0.26* 0.42  CREATININE 1.08 1.28  --  1.39*   Estimated Creatinine Clearance: 72.8 ml/min (by C-G formula based on Cr of 1.39).  Medical History: Past Medical History  Diagnosis Date  . Hypertension   . Diabetes mellitus   . Bipolar 1 disorder   . Hyperlipidemia   . Atrial fibrillation   . COPD (chronic obstructive pulmonary disease)   . Obstructive sleep apnea   . Chronic diastolic heart failure     Echocardiogram 05/14/11: Mild LVH, EF 50-55%, no wall motion abnormalities, grade 2 diastolic dysfunction.  . Chest pain     Myoview 05/17/11 demonstrated an EF of 61%, small fixed inferoseptal defect consistent with thinning vs prior infarct; no ischemia. - low risk  . Diabetic neuropathy   . Chronic back pain   . History of stroke   . Mental disorder     bipolar  . CHF (congestive heart failure)   . Stroke   . Neuromuscular disorder     diabetic neuropathy  . Chronic respiratory failure with hypoxia 08/08/2012  . Noncompliance with medication regimen   . Tobacco abuse    Medications:  Scheduled:  . ipratropium  0.5 mg Nebulization Q4H   And  . albuterol  2.5 mg  Nebulization Q4H  . antiseptic oral rinse  15 mL Mouth Rinse q12n4p  . chlorhexidine  15 mL Mouth/Throat BID  . diltiazem  15 mg Intravenous Q6H  . furosemide  40 mg Intravenous Q12H  . gabapentin  600 mg Oral Q8H  . insulin aspart  0-15 Units Subcutaneous Q4H  . levofloxacin (LEVAQUIN) IV  750 mg Intravenous Q24H  . methylPREDNISolone (SOLU-MEDROL) injection  80 mg Intravenous Q6H  . pantoprazole (PROTONIX) IV  40 mg Intravenous Q24H  . potassium chloride  10 mEq Intravenous Q1 Hr x 4  . QUEtiapine  150 mg Oral QHS  . sodium chloride  3 mL Intravenous Q12H    Assessment: 57yo male with h/o afib.  INR is at baseline.  Pt in ICU for respiratory failure.  Initiate and manage heparin for afib, no bolus per MD.  Heparin rate increased last pm to 1450 units/hr due to low heparin level.  Heparin level is therapeutic today.  Goal of Therapy:  Heparin level 0.3-0.7 units/ml Monitor platelets by anticoagulation protocol: Yes   Plan:  Continue Heparin at 1450 units/hr Heparin level daily Monitor CBC per protocol guidelines.  Valrie Hart A 12/01/2012,8:36 AM

## 2012-12-01 NOTE — Clinical Social Work Psychosocial (Signed)
    Clinical Social Work Department BRIEF PSYCHOSOCIAL ASSESSMENT 12/01/2012  Patient:  Sean Warren, Sean Warren     Account Number:  [id number]     Admit date:  11/29/2012  Clinical Social Worker:  Santa Genera, CLINICAL SOCIAL WORKER  Date/Time:  12/01/2012 11:00 AM  Referred by:  Physician  Date Referred:  12/01/2012 Referred for  SNF Placement   Other Referral:   Interview type:  Other - See comment Other interview type:   Roommate - Sean Warren    PSYCHOSOCIAL DATA Living Status:  FRIEND(S) Admitted from facility:   Level of care:   Primary support name:  Sean Warren, roommate Primary support relationship to patient:  NONE Degree of support available:   Roommate is not a caretaker, patient is not on lease    CURRENT CONCERNS Current Concerns  Post-Acute Placement   Other Concerns:    SOCIAL WORK ASSESSMENT / PLAN CSW unable to meet w patient, currently sedated and recently on vent.  Will defer assessment of patient until medicaly ready.  ICU RN referred patient's roommate, Sean Warren, who wanted to speak CSW re discharge planning.  CSW spoke w roommate by phone.    Warren says patient has lived in her apartment approx one year, moved to [place] from [place]. Patient is not on Warren's lease, roommate states she is not his caregiver and cannot continue to allow him to live in her apartment due to his inability to care for himself.  Says she has had to call EMS every 15 days for patient, patient is unable to do any ADLs other than feed himself.  Urinates and loses his bowels "all over the place" in the apartment, frequently falls to floor.  Patient says she is "not willing to put up w him", cannot get him in/out of car, has grandchildren who visit frequentlly and are "scared of him."  States that patient receives approx $1300/month income.  Roommate says patient was willing to go to SNF - had agreed to this before current hospitalization.  States she has talked to [facility]  and says "she has openings."  Patient has no family in the area, all his relatives are in [place] area.  Patient has no guardian and has handled all his affairs on his own.    Roommate asked if patient was "acting ugly."  Says she is willing to assist w talking to patient if that would help. Roommate is unwilling to have patient return to her apartment at discharge.   Assessment/plan status:  Psychosocial Support/Ongoing Assessment of Needs Other assessment/ plan:   Information/referral to community resources:   Will give SNF list to patient when he is able to be assessed by CSW    PATIENT'S/FAMILY'S RESPONSE TO PLAN OF CARE: Roommate adamant that patient cannot return to apartment at discharge and wants him placed in SNF or ALF, says he has agreed.    Santa Genera, LCSW Clinical Social Worker [phone])

## 2012-12-01 NOTE — Progress Notes (Signed)
Respiratory Care Note: The pt was re-intubated by CRNA at about 1757 with 8.0 ETT and it was secured at 24 cm, due to the pt being sleepy post medication for agitation leading to declining SAT, volume, poor ventilation. Current settings: PRVC- 550, 14, 40%, PEEP-5. Pt seem to tolerate current setting. ABG is ordered for 1 hr post intubation and mechanical ventilation and was reported to the on coming RRT. Current vitals: HR-83, RR-14, SAT- 94%, BS- expiratory wheezes noted and suctioned small thick white secretions.

## 2012-12-01 NOTE — Progress Notes (Signed)
Respiratory Care Note: The pt weaned on PS/CPAP- 10/5 the pt had good volumes while weaning (vti: 161-096  and vte: 398-769). I was able to get a NIF of -26 and -28. Unable to get to do the [facility] correctly. Vital prior to extubation: HR-107-120, RR-10-14, SAT- 94% on 40%,BS- diminished with expiratory wheezes noted. Paged Dr [name] to updated him on the pt's condition and weaning status and Dr Gonzella Lex came into the room at the bedside during weaning. Dr [name] gave the okay to go ahead extubate the pt. Dr Gonzella Lex also gave the order to also extubate the pt after medication is given to help agitation and increased HR. Post extubation vitals: HR-128, RR-23, BS- diminished, SAT- 95% on a VM due to the pt's mouth breathing. Will continue to monitor pt and wean pt's oxygen.

## 2012-12-01 NOTE — Progress Notes (Signed)
UR Chart Review Completed  

## 2012-12-01 NOTE — Progress Notes (Signed)
Respiratory Care Note: The pt was extubated earlier today per Dr [name] and Dr Gonzella Lex orders. The pt was place on BIPAP support (NIV of 15 above PEEP) and 40% at about 1215 due to the pt becoming sleepy post RN giving medication earlier to help with pt's agitation. Prior to this the pt was trying to communicate with words and also trying to communicate after being placed on NIV. The RN called me about 1510 she had given him more medication for agitation and he was having a decrease in RR and SAT. I adjusted the mode NIV-PC with a set rate of 14 and increased the 02 to 50%. ABG drawn per Dr Delford Field. Results were called to Dr Delford Field by RN. The pt will remain on current settings unless there are more changes with the pt's condition.

## 2012-12-01 NOTE — Anesthesia Procedure Notes (Signed)
Procedure Name: Intubation Date/Time: 12/01/2012 5:57 PM Performed by: Carolyne Littles, Britnee Mcdevitt L Pre-anesthesia Checklist: Patient identified, Emergency Drugs available, Suction available, Patient being monitored and Timeout performed Patient Re-evaluated:Patient Re-evaluated prior to inductionOxygen Delivery Method: Ambu bag Preoxygenation: Pre-oxygenation with 100% oxygen Intubation Type: IV induction, Rapid sequence and Cricoid Pressure applied Tube size: 8.0 mm Number of attempts: 1 Airway Equipment and Method: Video-laryngoscopy Placement Confirmation: ETT inserted through vocal cords under direct vision,  CO2 detector and breath sounds checked- equal and bilateral Secured at: 24 cm Tube secured with: Tape Dental Injury: Teeth and Oropharynx as per pre-operative assessment  Difficulty Due To: Difficulty was anticipated

## 2012-12-01 NOTE — Procedures (Signed)
Extubation Procedure Note  Patient Details:   Name: Sean Warren DOB: 1955-09-29 MRN: [id number]   Airway Documentation:  Airway 8 mm (Active)  Secured at (cm) 24 cm 12/01/2012  8:45 AM  Measured From Lips 12/01/2012  8:45 AM  Secured Location Center 12/01/2012  8:45 AM  Secured By [employer] 12/01/2012  8:45 AM  Tube Holder Repositioned Yes 12/01/2012  7:29 AM  Cuff Pressure (cm H2O) 22 cm H2O 12/01/2012  8:45 AM  Site Condition Dry 12/01/2012  8:45 AM    Evaluation  O2 sats: 94% on 50% VM Complications: None, extubated per Dr [name] and Dr Gonzella Lex Patient did tolerate procedure well. Bilateral Breath Sounds: Expiratory wheezes Suctioning: Airway;Oral The pt was able to speaks post extubation.  Seward Meth [facility] 12/01/2012, 9:38 AM

## 2012-12-01 NOTE — Progress Notes (Signed)
[facility] PROGRESS NOTE  Sean Warren OZH:[id number] DOB: 12/21/1955 DOA: 11/29/2012 PCP: [name], MD  Brief narrative  57 year old obese male with history of A. fib on Coumadin, COPD with chronic respiratory failure, chronic diastolic CHF, obstructive sleep apnea who has been admitted several times in past few months with acute on chronic respiratory failure and CHF symptoms was recently hospitalized for acute respiratory failure. He presented to the ED 2 days back with shortness of breath and appeared to be in mild respiratory failure with A. fib. He was advised to be admitted but he refused and went home. Patient had symptoms of shortness of breath yesterday and EMS was called or when his house last night but patient refused to come to the ED patient EMS was again called this morning when he was found lying in the floor lethargic and noncommunicative. Patient's O2 sat was 86% on 2 L via nasal cannula. Patient was lethargic. He was tried on BiPAP but was refusing to keep it on. arterial blood gas done that showed pH of 7.17, PCO2 of 104, PO2 of 61 and bicarbonate of 37. He was intubated and placed on mechanical ventilator in the ED. A Foley was placed in as well. [facility] called for admission to ICU.   Assessment/Plan:  Principal Problem:  Acute-on-chronic respiratory failure  Patient intubated in ED given hypercarbic respiratory failure with AMS. Admitted to ICU on ventilator.  Pulmonary consulted to help with vent management.  symptoms likely in the setting of COPD exacerbation with hypercarbic respiratory failure along with CHF and OSA.  -IV solumedrol 80 mg q6hr. Added IV Levaquin for retrocardiac opaciity seen on CXR.  -IV lasix 40 Mg Bid  -Patient extubated this morning but required BiPAP during the day as his sats were low however repeat blood gas this off and on showed significant respiratory acidosis with CO2 retention requiring intubation. -Continue on ventilator  with continuous sedation. Continue nebs. And his lites  Hypothermia  On presentation which shortly resolved. Normal random cortisol and TSH. A&P stable  Active Problems:  COPD (chronic obstructive pulmonary disease)   IV solumedrol and scheduled duonebs. Patient is on home o2.   CHF  Last echo 6 months back comments on low EF. patient was admitted recently with CHF exacerbation.  on IV lasix 40 mg bid. Monitor I/O.   CVA (cerebral infarction)  - ASA per rectum. Patient also on Warfarin but INR is subtherapeutic . Doubt complaince. placed on IV heparin while pt is NPO.   Afib  Patient on cardizem and warfarin at home. Currently in RVR.  increased IV  cardizem dose . We'll add metoprolol if rate still uncontrolled. Added IV heparin   DM (diabetes mellitus)  monitor FSG. on SSI q 4 hr   Hypokinemia Replenish with KCl  DVT prophylaxis: IV heparin   HPI/Subjective: Patient extubated this morning but did not tolerate Ventimask and had to be placed on BiPAP with good support. Repeat blood gas soft and wound showed significant CO2 retention and respiratory acidosis. Patient re intubated this evening.  Objective: Filed Vitals:   12/01/12 1810  BP: 111/80  Pulse:   Temp:   Resp: 16    Intake/Output Summary (Last 24 hours) at 12/01/12 1833 Last data filed at 12/01/12 1300  Gross per 24 hour  Intake 1034.4 ml  Output   1675 ml  Net -640.6 ml   Filed Weights   11/29/12 1404 11/30/12 0500 12/01/12 0500  Weight: 129 kg (284 lb 6.3 oz) 121.1  kg (266 lb 15.6 oz) 122.1 kg (269 lb 2.9 oz)    Exam:  Constitutional: Vital signs reviewed. Patient is a middle aged obese male intubated and on ventilator. Sedated.  HEENT:no pallor , intubated  Cardiovascular: RRR, S1 and S2 irregular, no MRG, pulses symmetric and intact bilaterally.  Pulmonary/Chest: CTAB, scattered rhonchi  Abdominal: Soft. Non-tender, non-distended, bowel sounds are normal,  extremities: 1+ pitting edema  bilaterally, foley in place.  CNS: sedated.   Data Reviewed: Basic Metabolic Panel:  Recent Labs Lab 11/27/12 0924 11/29/12 1100 11/30/12 0529 12/01/12 0428  NA 142 142 144 146*  K 4.5 4.6 3.8 3.3*  CL 102 97 99 101  CO2 38* 38* 34* 35*  GLUCOSE 150* 212* 207* 242*  BUN 12 21 28* 34*  CREATININE 0.90 1.08 1.28 1.39*  CALCIUM 9.3 9.5 9.1 9.1   Liver Function Tests:  Recent Labs Lab 11/27/12 0924 11/29/12 1100  AST 8 10  ALT 11 13  ALKPHOS 109 120*  BILITOT 0.3 0.3  PROT 6.8 7.5  ALBUMIN 3.0* 3.3*   No results found for this basename: LIPASE, AMYLASE,  in the last 168 hours  Recent Labs Lab 11/29/12 1101  AMMONIA 20   CBC:  Recent Labs Lab 11/27/12 0924 11/29/12 1100 12/01/12 0428  WBC 10.6* 8.8 7.1  NEUTROABS 8.7* 7.0  --   HGB 11.6* 13.7 11.2*  HCT 37.8* 44.1 35.7*  MCV 104.4* 102.8* 99.4  PLT 215 197 223   Cardiac Enzymes:  Recent Labs Lab 11/27/12 0924  TROPONINI <0.30   BNP (last 3 results)  Recent Labs  10/31/12 1355 11/17/12 2032 11/27/12 0924  PROBNP 1622.0* 1300.0* 2577.0*   CBG:  Recent Labs Lab 11/30/12 2342 12/01/12 0354 12/01/12 0731 12/01/12 1121 12/01/12 1557  GLUCAP 195* 212* 251* 210* 220*    Recent Results (from the past 240 hour(s))  URINE CULTURE     Status: None   Collection Time    11/29/12 12:37 PM      Result Value Range Status   Specimen Description URINE, CATHETERIZED   Final   Special Requests NONE   Final   Culture  Setup Time     Final   Value: 11/29/2012 21:50     Performed at [facility] Count     Final   Value: NO GROWTH     Performed at [facility]   Culture     Final   Value: NO GROWTH     Performed at [facility]   Report Status 11/30/2012 FINAL   Final  CULTURE, RESPIRATORY (NON-EXPECTORATED)     Status: None   Collection Time    11/29/12  1:05 PM      Result Value Range Status   Specimen Description TRACHEAL ASPIRATE   Final   Special  Requests NONE   Final   Gram Stain     Final   Value: MODERATE WBC PRESENT, PREDOMINANTLY PMN     RARE SQUAMOUS EPITHELIAL CELLS PRESENT     NO ORGANISMS SEEN     Performed at [facility]   Culture     Final   Value: Culture reincubated for better growth     Performed at [facility]   Report Status PENDING   Incomplete     Studies: Dg Chest 1v Repeat Same Day  12/01/2012   CLINICAL DATA:  Respiratory failure and re-intubation.  EXAM: CHEST - 1 VIEW SAME DAY  COMPARISON:  12/01/2012  FINDINGS: Endotracheal tube present which is within 1 cm of the carina. Lungs show bibasilar atelectasis. No pulmonary edema, airspace consolidation or pleural fluid is identified. There is stable cardiomegaly.  IMPRESSION: Endotracheal tube tip within 1 cm of the carina.   Electronically Signed   By: Irish Lack   On: 12/01/2012 18:17   Dg Chest Port 1 View  12/01/2012   *RADIOLOGY REPORT*  Clinical Data: Evaluate endotracheal tube.  PORTABLE CHEST - 1 VIEW  Comparison: 11/30/2012  Findings: Endotracheal tube is roughly 5.8 cm above the carina. The nasogastric tube extends into the upper abdomen but the tip is difficult to visualize.  Again noted are prominent interstitial markings concerning for congestion or mild edema. Improved aeration at the left lung base.  Heart size remains upper limits of normal.  IMPRESSION: Slightly improved aeration at the left lung base.  Prominent interstitial and vascular markings are compatible with mild edema.  Support apparatuses as described.   Original Report Authenticated By: Richarda Overlie, M.D.   Dg Chest Port 1 View  11/30/2012   CLINICAL DATA:  57 year old male with respiratory failure. Intubated.  EXAM: PORTABLE CHEST - 1 VIEW  COMPARISON:  11/29/2012 and earlier.  FINDINGS: Portable AP semi upright view at 0739 hr. The patient is more rotated to the right. Stable endotracheal tube. Stable visible enteric tube. Stable cardiomegaly and mediastinal contours.  Increased left retrocardiac opacity. Skin fold artifact in the right lung. No pneumothorax. Increased vascular congestion. No overt edema. No large effusion.  IMPRESSION: 1. Rotated to the left. Stable lines and tubes.  2. Increased left lower lobe collapse or consolidation.  3. Increased vascular congestion without overt edema.   Electronically Signed   By: Augusto Gamble M.D.   On: 11/30/2012 07:59    Scheduled Meds: . ipratropium  0.5 mg Nebulization Q4H   And  . albuterol  2.5 mg Nebulization Q4H  . antiseptic oral rinse  15 mL Mouth Rinse q12n4p  . [START ON 12/02/2012] antiseptic oral rinse  15 mL Mouth Rinse QID  . chlorhexidine  15 mL Mouth/Throat BID  . chlorhexidine  15 mL Mouth Rinse BID  . diltiazem  15 mg Intravenous Q6H  . etomidate      . furosemide  40 mg Intravenous Q12H  . gabapentin  600 mg Oral Q8H  . insulin aspart  0-15 Units Subcutaneous Q4H  . levofloxacin (LEVAQUIN) IV  750 mg Intravenous Q24H  . lidocaine (cardiac) 100 mg/24ml      . methylPREDNISolone (SOLU-MEDROL) injection  80 mg Intravenous Q6H  . pantoprazole (PROTONIX) IV  40 mg Intravenous Q24H  . propofol      . QUEtiapine  150 mg Oral QHS  . rocuronium      . sodium chloride  3 mL Intravenous Q12H  . succinylcholine       Continuous Infusions: . fentaNYL infusion INTRAVENOUS    . heparin 1,450 Units/hr (12/01/12 0800)  . propofol        Time spent: 35 minutes    Casie Sturgeon  [facility] Pager [phone]. If 7PM-7AM, please contact night-coverage at [email or web address], password [facility] 12/01/2012, 6:33 PM  LOS: 2 days

## 2012-12-02 ENCOUNTER — Inpatient Hospital Stay (HOSPITAL_COMMUNITY): Payer: Medicare Other

## 2012-12-02 LAB — GLUCOSE, CAPILLARY
Glucose-Capillary: 177 mg/dL — ABNORMAL HIGH (ref 70–99)
Glucose-Capillary: 166 mg/dL — ABNORMAL HIGH (ref 70–99)
Glucose-Capillary: 238 mg/dL — ABNORMAL HIGH (ref 70–99)
Glucose-Capillary: 329 mg/dL — ABNORMAL HIGH (ref 70–99)

## 2012-12-02 LAB — BLOOD GAS, ARTERIAL
Acid-Base Excess: 12.1 mmol/L — ABNORMAL HIGH (ref 0.0–2.0)
Drawn by: 21310
MECHVT: 550 mL
PEEP: 5 cmH2O
RATE: 14 resp/min
pCO2 arterial: 50.3 mmHg — ABNORMAL HIGH (ref 35.0–45.0)
pH, Arterial: 7.474 — ABNORMAL HIGH (ref 7.350–7.450)

## 2012-12-02 LAB — CULTURE, RESPIRATORY W GRAM STAIN

## 2012-12-02 MED ORDER — OXEPA PO LIQD
1000.0000 mL | ORAL | Status: DC
  Administered 2012-12-02 – 2012-12-05 (×4): 1000 mL
  Filled 2012-12-02 (×7): qty 1000

## 2012-12-02 MED ORDER — METHYLPREDNISOLONE SODIUM SUCC 125 MG IJ SOLR
60.0000 mg | INTRAMUSCULAR | Status: DC
  Administered 2012-12-03 – 2012-12-05 (×3): 60 mg via INTRAVENOUS
  Filled 2012-12-02 (×4): qty 2

## 2012-12-02 MED ORDER — INSULIN ASPART 100 UNIT/ML ~~LOC~~ SOLN
0.0000 [IU] | SUBCUTANEOUS | Status: DC
  Administered 2012-12-02: 7 [IU] via SUBCUTANEOUS
  Administered 2012-12-03 (×2): 11 [IU] via SUBCUTANEOUS
  Administered 2012-12-03: 01:00:00 via SUBCUTANEOUS
  Administered 2012-12-03 (×2): 11 [IU] via SUBCUTANEOUS
  Administered 2012-12-03: 15 [IU] via SUBCUTANEOUS
  Administered 2012-12-04: 7 [IU] via SUBCUTANEOUS
  Administered 2012-12-04 (×2): 4 [IU] via SUBCUTANEOUS
  Administered 2012-12-04 (×2): 15 [IU] via SUBCUTANEOUS
  Administered 2012-12-04 – 2012-12-05 (×2): 7 [IU] via SUBCUTANEOUS
  Administered 2012-12-05: 11 [IU] via SUBCUTANEOUS
  Administered 2012-12-05 (×3): 7 [IU] via SUBCUTANEOUS

## 2012-12-02 NOTE — Progress Notes (Signed)
INITIAL NUTRITION ASSESSMENT  DOCUMENTATION CODES Per approved criteria  -Morbid Obesity   INTERVENTION: MD orders noted for placement of OGT (orogastric tube) and initiation of tube feeding: Recommend consider changing enteral formula to Vital 1.2 (in order to more adequately meet increased protein needs).  Initiate Vital 1.2 @ 20 ml/hr via OGT. Add 30 ml Prostat QID.  At goal rate, tube feeding regimen will provide 976 kcal, 96 grams of protein, and 379 ml of H2O.  Nutrition support including propofol (54ml/hr) would deliver 1504 kcal, 96 gr protein at current rates noted.   NUTRITION DIAGNOSIS: Inadequate oral intake related to inability to eat as evidenced by  NPO status due to intubation.  Goal: Enteral nutrition to provide 60-70% of estimated calorie needs (22-25 kcals/kg ideal body weight) and 100% of estimated protein needs, based on ASPEN guidelines for permissive underfeeding in critically ill obese individuals.  Monitor:  Respiratory status, nutrition support tolerance, labs, I/O's and wt trends  Reason for Assessment: Pt extubated/re-intubated, TF initiated, Braden Score=11   57 y.o. male  Admitting Dx: Acute-on-chronic respiratory failure  ASSESSMENT: Patient is currently re-intubated on ventilator support. Tube feeding ordered following placement of orogastric tube. Continuous Oxepa @ 30 ml/hr which will provide 1080 kcal,45 gr protein,565 ml/d water.  MV: 7.6 L/min Temp:Temp (24hrs), Avg:97.9 F (36.6 C), Min:97.6 F (36.4 C), Max:98.4 F (36.9 C)  Propofol: 22 ml/hr : (at current rate will provide 528 kcal lipids q 24 hr)  Height: Ht Readings from Last 1 Encounters:  11/29/12 5\' 8"  (1.727 m)    Weight: Wt Readings from Last 1 Encounters:  12/01/12 269 lb 2.9 oz (122.1 kg)    Ideal Body Weight: 154# (70 kg)  % Ideal Body Weight: 175%  Wt Readings from Last 10 Encounters:  12/01/12 269 lb 2.9 oz (122.1 kg)  11/27/12 265 lb (120.203 kg)  11/19/12  265 lb 14 oz (120.6 kg)  10/31/12 300 lb (136.079 kg)  10/12/12 300 lb (136.079 kg)  08/28/12 260 lb 5.8 oz (118.1 kg)  08/09/12 256 lb 6.3 oz (116.3 kg)  05/08/12 261 lb (118.389 kg)  01/24/12 258 lb 1.9 oz (117.082 kg)  01/04/12 251 lb 12.3 oz (114.2 kg)    Usual Body Weight: 250-260#  % Usual Body Weight: 103%  BMI:  Body mass index is 40.94 kg/(m^2).extreme obesity class III  Estimated Nutritional Needs: Kcal: 2070 (60-70% goal is 1242-1449 kcal/day) Protein: 154-175 gr/day Fluid: per MD goals  Skin: Intact  Diet Order: NPO  EDUCATION NEEDS: -Education not appropriate at this time   Intake/Output Summary (Last 24 hours) at 12/02/12 0940 Last data filed at 12/02/12 0800  Gross per 24 hour  Intake 1125.87 ml  Output   3725 ml  Net -2599.13 ml    Last BM:   Labs:   Recent Labs Lab 11/29/12 1100 11/30/12 0529 12/01/12 0428  NA 142 144 146*  K 4.6 3.8 3.3*  CL 97 99 101  CO2 38* 34* 35*  BUN 21 28* 34*  CREATININE 1.08 1.28 1.39*  CALCIUM 9.5 9.1 9.1  GLUCOSE 212* 207* 242*    CBG (last 3)   Recent Labs  12/02/12 0306 12/02/12 0651 12/02/12 0741  GLUCAP 177* 166* 171*    Scheduled Meds: . ipratropium  0.5 mg Nebulization Q4H   And  . albuterol  2.5 mg Nebulization Q4H  . antiseptic oral rinse  15 mL Mouth Rinse q12n4p  . antiseptic oral rinse  15 mL Mouth Rinse QID  .  chlorhexidine  15 mL Mouth Rinse BID  . diltiazem  15 mg Intravenous Q6H  . feeding supplement (OXEPA)  1,000 mL Per Tube Q24H  . furosemide  40 mg Intravenous Q12H  . gabapentin  600 mg Oral Q8H  . insulin aspart  0-15 Units Subcutaneous Q4H  . levofloxacin (LEVAQUIN) IV  750 mg Intravenous Q24H  . methylPREDNISolone (SOLU-MEDROL) injection  80 mg Intravenous Q6H  . pantoprazole (PROTONIX) IV  40 mg Intravenous Q24H  . QUEtiapine  150 mg Oral QHS  . sodium chloride  3 mL Intravenous Q12H    Continuous Infusions: . fentaNYL infusion INTRAVENOUS 50 mcg/hr (12/02/12  0800)  . heparin 1,450 Units/hr (12/02/12 0800)  . propofol 30 mcg/kg/min (12/02/12 0800)    Past Medical History  Diagnosis Date  . Hypertension   . Diabetes mellitus   . Bipolar 1 disorder   . Hyperlipidemia   . Atrial fibrillation   . COPD (chronic obstructive pulmonary disease)   . Obstructive sleep apnea   . Chronic diastolic heart failure     Echocardiogram 05/14/11: Mild LVH, EF 50-55%, no wall motion abnormalities, grade 2 diastolic dysfunction.  . Chest pain     Myoview 05/17/11 demonstrated an EF of 61%, small fixed inferoseptal defect consistent with thinning vs prior infarct; no ischemia. - low risk  . Diabetic neuropathy   . Chronic back pain   . History of stroke   . Mental disorder     bipolar  . CHF (congestive heart failure)   . Stroke   . Neuromuscular disorder     diabetic neuropathy  . Chronic respiratory failure with hypoxia 08/08/2012  . Noncompliance with medication regimen   . Tobacco abuse     Past Surgical History  Procedure Laterality Date  . No past surgeries      Royann Shivers MS,RD,LDN,CSG Office: [phone] Pager: [phone]

## 2012-12-02 NOTE — Progress Notes (Signed)
[facility] PROGRESS NOTE  Sean Warren ZOX:[id number] DOB: [birth year] DOA: 11/29/2012 PCP: [name], MD  Brief narrative  57 year old obese male with history of A. fib on Coumadin, COPD with chronic respiratory failure, chronic diastolic CHF, obstructive sleep apnea who has been admitted several times in past few months with acute on chronic respiratory failure and CHF symptoms was recently hospitalized for acute respiratory failure. He presented to the ED 2 days back with shortness of breath and appeared to be in mild respiratory failure with A. fib. He was advised to be admitted but he refused and went home. Patient had symptoms of shortness of breath yesterday and EMS was called or when his house last night but patient refused to come to the ED patient EMS was again called this morning when he was found lying in the floor lethargic and noncommunicative. Patient's O2 sat was 86% on 2 L via nasal cannula. Patient was lethargic. He was tried on BiPAP but was refusing to keep it on. arterial blood gas done that showed pH of 7.17, PCO2 of 104, PO2 of 61 and bicarbonate of 37. He was intubated and placed on mechanical ventilator in the ED. A Foley was placed in as well. [facility] called for admission to ICU.   Assessment/Plan:  Principal Problem:  Acute-on-chronic respiratory failure  Patient intubated in ED given hypercarbic respiratory failure with AMS. Admitted to ICU on ventilator. Pulmonary consulted to help with vent management.  -symptoms likely in the setting of COPD exacerbation with hypercarbic respiratory failure along with CHF and OSA.  -IV solumedrol added.  Added IV Levaquin for retrocardiac opaciity seen on CXR.  -IV lasix 40 Mg Bid . -Patient extubated on 9/15 but required BiPAP during the day as his sats were low however repeat blood gas this off and on showed significant respiratory acidosis with CO2 retention requiring intubation.  -Continue on ventilator with  continuous sedation. Continue nebs. Monitor lytes. -Appreciate pulmonary consult. We'll reassess in a.m. for weaning  Hypothermia  On presentation which shortly resolved. Normal random cortisol and TSH.  Stable at present  Active Problems:  COPD (chronic obstructive pulmonary disease)  IV solumedrol and scheduled duonebs. Patient is on home o2.   CHF  Last echo 6 months back comments on low EF. patient was admitted recently with CHF exacerbation. on IV lasix 40 mg bid. Monitor I/O.   CVA (cerebral infarction)  - ASA per rectum. Patient also on Warfarin but INR is subtherapeutic . Doubt complaince. placed on IV heparin while pt is NPO.   Afib  Patient on cardizem and warfarin at home. Becomes tachycardic and in RVR with agitation. increased IV cardizem dose . We'll add metoprolol if rate still uncontrolled.  Added IV heparin   DM (diabetes mellitus)  monitor FSG. on SSI q 4 hr . Will change to resistant  sliding scale  Hypokinemia  Replenish with KCl   DVT prophylaxis: IV heparin   CODE STATUS: Full code Family communication: None at bedside Disposition: Likely need to skilled nursing facility upon discharge  HPI/Subjective:  Patient stable on the vent. OG tube placed for feedings. Requiring continuous sedation   Objective: Filed Vitals:   12/02/12 1230  BP: 133/102  Pulse: 85  Temp:   Resp: 14    Intake/Output Summary (Last 24 hours) at 12/02/12 1718 Last data filed at 12/02/12 1200  Gross per 24 hour  Intake 1054.57 ml  Output   3500 ml  Net -2445.43 ml   [employer]  11/29/12 1404 11/30/12 0500 12/01/12 0500  Weight: 129 kg (284 lb 6.3 oz) 121.1 kg (266 lb 15.6 oz) 122.1 kg (269 lb 2.9 oz)    Exam:  Constitutional: Vital signs reviewed. Patient is a middle aged obese male intubated and on ventilator. Sedated.  HEENT:no pallor , intubated  Cardiovascular: RRR, S1 and S2 irregular, no MRG, pulses symmetric and intact bilaterally.  Pulmonary/Chest:  CTAB, scattered rhonchi  Abdominal: Soft. Non-tender, non-distended, bowel sounds are normal,  extremities: 1+ pitting edema bilaterally, foley in place.  CNS: sedated.   Data Reviewed: Basic Metabolic Panel:  Recent Labs Lab 11/27/12 0924 11/29/12 1100 11/30/12 0529 12/01/12 0428  NA 142 142 144 146*  K 4.5 4.6 3.8 3.3*  CL 102 97 99 101  CO2 38* 38* 34* 35*  GLUCOSE 150* 212* 207* 242*  BUN 12 21 28* 34*  CREATININE 0.90 1.08 1.28 1.39*  CALCIUM 9.3 9.5 9.1 9.1   Liver Function Tests:  Recent Labs Lab 11/27/12 0924 11/29/12 1100  AST 8 10  ALT 11 13  ALKPHOS 109 120*  BILITOT 0.3 0.3  PROT 6.8 7.5  ALBUMIN 3.0* 3.3*   No results found for this basename: LIPASE, AMYLASE,  in the last 168 hours  Recent Labs Lab 11/29/12 1101  AMMONIA 20   CBC:  Recent Labs Lab 11/27/12 0924 11/29/12 1100 12/01/12 0428  WBC 10.6* 8.8 7.1  NEUTROABS 8.7* 7.0  --   HGB 11.6* 13.7 11.2*  HCT 37.8* 44.1 35.7*  MCV 104.4* 102.8* 99.4  PLT 215 197 223   Cardiac Enzymes:  Recent Labs Lab 11/27/12 0924  TROPONINI <0.30   BNP (last 3 results)  Recent Labs  10/31/12 1355 11/17/12 2032 11/27/12 0924  PROBNP 1622.0* 1300.0* 2577.0*   CBG:  Recent Labs Lab 12/02/12 0306 12/02/12 0651 12/02/12 0741 12/02/12 1111 12/02/12 1624  GLUCAP 177* 166* 171* 209* 238*    Recent Results (from the past 240 hour(s))  URINE CULTURE     Status: None   Collection Time    11/29/12 12:37 PM      Result Value Range Status   Specimen Description URINE, CATHETERIZED   Final   Special Requests NONE   Final   Culture  Setup Time     Final   Value: 11/29/2012 21:50     Performed at [facility] Count     Final   Value: NO GROWTH     Performed at [facility]   Culture     Final   Value: NO GROWTH     Performed at [facility]   Report Status 11/30/2012 FINAL   Final  CULTURE, RESPIRATORY (NON-EXPECTORATED)     Status: None   Collection  Time    11/29/12  1:05 PM      Result Value Range Status   Specimen Description TRACHEAL ASPIRATE   Final   Special Requests NONE   Final   Gram Stain     Final   Value: MODERATE WBC PRESENT, PREDOMINANTLY PMN     RARE SQUAMOUS EPITHELIAL CELLS PRESENT     NO ORGANISMS SEEN     Performed at [facility]   Culture     Final   Value: FEW MORAXELLA CATARRHALIS(BRANHAMELLA)     Note: BETA LACTAMASE POSITIVE     Performed at [facility]   Report Status 12/02/2012 FINAL   Final     Studies: Dg Chest 1v Repeat Same Day  12/01/2012   CLINICAL DATA:  Respiratory failure and re-intubation.  EXAM: CHEST - 1 VIEW SAME DAY  COMPARISON:  12/01/2012  FINDINGS: Endotracheal tube present which is within 1 cm of the carina. Lungs show bibasilar atelectasis. No pulmonary edema, airspace consolidation or pleural fluid is identified. There is stable cardiomegaly.  IMPRESSION: Endotracheal tube tip within 1 cm of the carina.   Electronically Signed   By: Irish Lack   On: 12/01/2012 18:17   Dg Chest Port 1 View  12/02/2012   *RADIOLOGY REPORT*  Clinical Data: Respiratory failure.  Respiratory distress.  PORTABLE CHEST - 1 VIEW  Comparison: 12/01/2012.  Findings: Cardiomegaly.  Endotracheal tube remains present with the tip 36 mm from the carina.  No focal consolidation.  Basilar atelectasis. Monitoring leads are projected over the chest. No pneumothorax. Patient rotated to the right.  IMPRESSION:  1.  Endotracheal tube tip 36 mm from the carina. 2.  Cardiomegaly without failure.   Original Report Authenticated By: Andreas Newport, M.D.   Dg Chest Port 1 View  12/01/2012   *RADIOLOGY REPORT*  Clinical Data: Evaluate endotracheal tube.  PORTABLE CHEST - 1 VIEW  Comparison: 11/30/2012  Findings: Endotracheal tube is roughly 5.8 cm above the carina. The nasogastric tube extends into the upper abdomen but the tip is difficult to visualize.  Again noted are prominent interstitial markings concerning  for congestion or mild edema. Improved aeration at the left lung base.  Heart size remains upper limits of normal.  IMPRESSION: Slightly improved aeration at the left lung base.  Prominent interstitial and vascular markings are compatible with mild edema.  Support apparatuses as described.   Original Report Authenticated By: Richarda Overlie, M.D.    Scheduled Meds: . ipratropium  0.5 mg Nebulization Q4H   And  . albuterol  2.5 mg Nebulization Q4H  . antiseptic oral rinse  15 mL Mouth Rinse q12n4p  . antiseptic oral rinse  15 mL Mouth Rinse QID  . chlorhexidine  15 mL Mouth Rinse BID  . diltiazem  15 mg Intravenous Q6H  . feeding supplement (OXEPA)  1,000 mL Per Tube Q24H  . furosemide  40 mg Intravenous Q12H  . gabapentin  600 mg Oral Q8H  . insulin aspart  0-15 Units Subcutaneous Q4H  . levofloxacin (LEVAQUIN) IV  750 mg Intravenous Q24H  . methylPREDNISolone (SOLU-MEDROL) injection  80 mg Intravenous Q6H  . pantoprazole (PROTONIX) IV  40 mg Intravenous Q24H  . QUEtiapine  150 mg Oral QHS  . sodium chloride  3 mL Intravenous Q12H   Continuous Infusions: . fentaNYL infusion INTRAVENOUS 50 mcg/hr (12/02/12 1200)  . heparin 1,350 Units/hr (12/02/12 1200)  . propofol 40 mcg/kg/min (12/02/12 1401)      Time spent: 35 minutes    Rochelle Larue  [facility] Pager [phone] If 7PM-7AM, please contact night-coverage at [email or web address], password [facility] 12/02/2012, 5:18 PM  LOS: 3 days

## 2012-12-02 NOTE — Progress Notes (Signed)
Inpatient Diabetes Program Recommendations  AACE/ADA: New Consensus Statement on Inpatient Glycemic Control (2013)  Target Ranges:  Prepandial:   less than 140 mg/dL      Peak postprandial:   less than 180 mg/dL (1-2 hours)      Critically ill patients:  140 - 180 mg/dL   Results for TRASE, BUNDA (MRN [id number]) as of 12/02/2012 11:41  Ref. Range 12/01/2012 11:21 12/01/2012 15:57 12/01/2012 20:12 12/01/2012 22:54 12/02/2012 03:06 12/02/2012 06:51 12/02/2012 07:41 12/02/2012 11:11  Glucose-Capillary Latest Range: 70-99 mg/dL 295 (H) 621 (H) 308 (H) 168 (H) 177 (H) 166 (H) 171 (H) 209 (H)    Inpatient Diabetes Program Recommendations Correction (SSI): Please increase Novolog correction to resistant scale Q4H.  Note: Blood glucose over the past 24 hours has ranged from 166-220 mg/dl.  Noted that tube feeding will be started on patient which will likely contribute to hyperglycemia.  Please consider increasing Novolog correction to resistant scale Q4H while on steroids.  Thanks, Orlando Penner, RN, MSN, CCRN Diabetes Coordinator Inpatient Diabetes Program [phone] (Team Pager) [phone] ([facility] office) [phone] [facility] office)

## 2012-12-02 NOTE — Progress Notes (Signed)
ANTICOAGULATION CONSULT NOTE   Pharmacy Consult for Heparin Indication: atrial fibrillation  Allergies  Allergen Reactions  . Flexeril [Cyclobenzaprine] Other (See Comments)    Alters Mental Status-ALL MUSCLE RELAXERS  . Muscle Relief [Capsaicin]     Hysteria    Patient Measurements: Height: 5\' 8"  (172.7 cm) Weight: 269 lb 2.9 oz (122.1 kg) IBW/kg (Calculated) : 68.4 Heparin Dosing Weight: 89Kg  Vital Signs: Temp: 98.2 F (36.8 C) (09/16 0800) Temp src: Oral (09/16 0800) BP: 169/112 mmHg (09/16 0830) Pulse Rate: 96 (09/16 0830)  Labs:  Recent Labs  11/29/12 1100 11/30/12 0529 11/30/12 1812 12/01/12 0428 12/02/12 0503  HGB 13.7  --   --  11.2*  --   HCT 44.1  --   --  35.7*  --   PLT 197  --   --  223  --   APTT 33  --   --   --   --   LABPROT 13.1  --   --   --   --   INR 1.01  --   --   --   --   HEPARINUNFRC  --   --  0.26* 0.42 0.75*  CREATININE 1.08 1.28  --  1.39*  --    Estimated Creatinine Clearance: 72.8 ml/min (by C-G formula based on Cr of 1.39).  Medical History: Past Medical History  Diagnosis Date  . Hypertension   . Diabetes mellitus   . Bipolar 1 disorder   . Hyperlipidemia   . Atrial fibrillation   . COPD (chronic obstructive pulmonary disease)   . Obstructive sleep apnea   . Chronic diastolic heart failure     Echocardiogram 05/14/11: Mild LVH, EF 50-55%, no wall motion abnormalities, grade 2 diastolic dysfunction.  . Chest pain     Myoview 05/17/11 demonstrated an EF of 61%, small fixed inferoseptal defect consistent with thinning vs prior infarct; no ischemia. - low risk  . Diabetic neuropathy   . Chronic back pain   . History of stroke   . Mental disorder     bipolar  . CHF (congestive heart failure)   . Stroke   . Neuromuscular disorder     diabetic neuropathy  . Chronic respiratory failure with hypoxia 08/08/2012  . Noncompliance with medication regimen   . Tobacco abuse    Medications:  Scheduled:  . ipratropium  0.5 mg  Nebulization Q4H   And  . albuterol  2.5 mg Nebulization Q4H  . antiseptic oral rinse  15 mL Mouth Rinse q12n4p  . antiseptic oral rinse  15 mL Mouth Rinse QID  . chlorhexidine  15 mL Mouth Rinse BID  . diltiazem  15 mg Intravenous Q6H  . feeding supplement (OXEPA)  1,000 mL Per Tube Q24H  . furosemide  40 mg Intravenous Q12H  . gabapentin  600 mg Oral Q8H  . insulin aspart  0-15 Units Subcutaneous Q4H  . levofloxacin (LEVAQUIN) IV  750 mg Intravenous Q24H  . methylPREDNISolone (SOLU-MEDROL) injection  80 mg Intravenous Q6H  . pantoprazole (PROTONIX) IV  40 mg Intravenous Q24H  . QUEtiapine  150 mg Oral QHS  . sodium chloride  3 mL Intravenous Q12H    Assessment: 57yo male with h/o afib on chronic warfarin.  INR at baseline on admission.  Patient admitted with respiratory failure currently vent-dependent.  Heparin bridge for afib while patient is NPO.   Heparin level just above desired therapeutic range today. No bleeding noted.   Goal of Therapy:  Heparin level  0.3-0.7 units/ml Monitor platelets by anticoagulation protocol: Yes   Plan:  Decrease Heparin at 1350 units/hr Heparin level & CBC daily while on heparin.  Elson Clan 12/02/2012,10:57 AM

## 2012-12-02 NOTE — Progress Notes (Signed)
Subjective: He was extubated yesterday. He was not cooperative with extubation attempts so we simply extubated him based on his clinical picture. However he developed increasing problems with PCO2 in being sleepy. He required medication for agitation. He had to be re\re intubated about 10 hours after being extubated. He is now intubated sedated on the ventilator.  Objective: Vital signs in last 24 hours: Temp:  [97.6 F (36.4 C)-98.4 F (36.9 C)] 97.6 F (36.4 C) (09/16 0407) Pulse Rate:  [43-166] 85 (09/16 0645) Resp:  [8-28] 14 (09/16 0645) BP: (111-186)/(66-167) 150/75 mmHg (09/16 0645) SpO2:  [88 %-100 %] 89 % (09/16 0645) FiO2 (%):  [40 %-50 %] 40 % (09/16 0503) Weight change:  Last BM Date: 11/30/12  Intake/Output from previous day: 09/15 0701 - 09/16 0700 In: 1288.7 [I.V.:788.7; IV Piggyback:500] Out: 3725 [Urine:3725]  PHYSICAL EXAM General appearance: morbidly obese and Intubated and sedated. When sedation is decreased he begins pulling at all of his tubes Melvern Banker. Resp: rhonchi bilaterally Cardio: irregularly irregular rhythm GI: soft, non-tender; bowel sounds normal; no masses,  no organomegaly Extremities: extremities normal, atraumatic, no cyanosis or edema  Lab Results:    Basic Metabolic Panel:  Recent Labs  40/98/11 0529 12/01/12 0428  NA 144 146*  K 3.8 3.3*  CL 99 101  CO2 34* 35*  GLUCOSE 207* 242*  BUN 28* 34*  CREATININE 1.28 1.39*  CALCIUM 9.1 9.1   Liver Function Tests:  Recent Labs  11/29/12 1100  AST 10  ALT 13  ALKPHOS 120*  BILITOT 0.3  PROT 7.5  ALBUMIN 3.3*   No results found for this basename: LIPASE, AMYLASE,  in the last 72 hours  Recent Labs  11/29/12 1101  AMMONIA 20   CBC:  Recent Labs  11/29/12 1100 12/01/12 0428  WBC 8.8 7.1  NEUTROABS 7.0  --   HGB 13.7 11.2*  HCT 44.1 35.7*  MCV 102.8* 99.4  PLT 197 223   Cardiac Enzymes: No results found for this basename: CKTOTAL, CKMB, CKMBINDEX, TROPONINI,   in the last 72 hours BNP: No results found for this basename: PROBNP,  in the last 72 hours D-Dimer: No results found for this basename: DDIMER,  in the last 72 hours CBG:  Recent Labs  12/01/12 1121 12/01/12 1557 12/01/12 2012 12/01/12 2254 12/02/12 0306 12/02/12 0651  GLUCAP 210* 220* 170* 168* 177* 166*   Hemoglobin A1C: No results found for this basename: HGBA1C,  in the last 72 hours Fasting Lipid Panel: No results found for this basename: CHOL, HDL, LDLCALC, TRIG, CHOLHDL, LDLDIRECT,  in the last 72 hours Thyroid Function Tests:  Recent Labs  11/29/12 1100  TSH 0.544   Anemia Panel: No results found for this basename: VITAMINB12, FOLATE, FERRITIN, TIBC, IRON, RETICCTPCT,  in the last 72 hours Coagulation:  Recent Labs  11/29/12 1100  LABPROT 13.1  INR 1.01   Urine Drug Screen: Drugs of Abuse     Component Value Date/Time   LABOPIA NONE DETECTED 11/29/2012 1357   LABOPIA NEG 08/26/2007 2017   COCAINSCRNUR NONE DETECTED 11/29/2012 1357   COCAINSCRNUR NEG 08/26/2007 2017   LABBENZ POSITIVE* 11/29/2012 1357   LABBENZ NEG 08/26/2007 2017   AMPHETMU NONE DETECTED 11/29/2012 1357   AMPHETMU NEG 08/26/2007 2017   THCU NONE DETECTED 11/29/2012 1357   LABBARB POSITIVE* 11/29/2012 1357    Alcohol Level:  Recent Labs  11/29/12 1100  ETH <11   Urinalysis:  Recent Labs  11/29/12 1237  COLORURINE YELLOW  LABSPEC >  1.030*  PHURINE 5.5  GLUCOSEU NEGATIVE  HGBUR NEGATIVE  BILIRUBINUR SMALL*  KETONESUR TRACE*  PROTEINUR 30*  UROBILINOGEN 0.2  NITRITE NEGATIVE  LEUKOCYTESUR NEGATIVE   Misc. Labs:  ABGS  Recent Labs  12/02/12 0430  PHART 7.474*  PO2ART 74.9*  TCO2 32.6  HCO3 36.5*   CULTURES Recent Results (from the past 240 hour(s))  URINE CULTURE     Status: None   Collection Time    11/29/12 12:37 PM      Result Value Range Status   Specimen Description URINE, CATHETERIZED   Final   Special Requests NONE   Final   Culture  Setup Time     Final    Value: 11/29/2012 21:50     Performed at [facility] Count     Final   Value: NO GROWTH     Performed at [facility]   Culture     Final   Value: NO GROWTH     Performed at [facility]   Report Status 11/30/2012 FINAL   Final  CULTURE, RESPIRATORY (NON-EXPECTORATED)     Status: None   Collection Time    11/29/12  1:05 PM      Result Value Range Status   Specimen Description TRACHEAL ASPIRATE   Final   Special Requests NONE   Final   Gram Stain     Final   Value: MODERATE WBC PRESENT, PREDOMINANTLY PMN     RARE SQUAMOUS EPITHELIAL CELLS PRESENT     NO ORGANISMS SEEN     Performed at [facility]   Culture     Final   Value: Culture reincubated for better growth     Performed at [facility]   Report Status PENDING   Incomplete   Studies/Results: Dg Chest 1v Repeat Same Day  12/01/2012   CLINICAL DATA:  Respiratory failure and re-intubation.  EXAM: CHEST - 1 VIEW SAME DAY  COMPARISON:  12/01/2012  FINDINGS: Endotracheal tube present which is within 1 cm of the carina. Lungs show bibasilar atelectasis. No pulmonary edema, airspace consolidation or pleural fluid is identified. There is stable cardiomegaly.  IMPRESSION: Endotracheal tube tip within 1 cm of the carina.   Electronically Signed   By: Irish Lack   On: 12/01/2012 18:17   Dg Chest Port 1 View  12/01/2012   *RADIOLOGY REPORT*  Clinical Data: Evaluate endotracheal tube.  PORTABLE CHEST - 1 VIEW  Comparison: 11/30/2012  Findings: Endotracheal tube is roughly 5.8 cm above the carina. The nasogastric tube extends into the upper abdomen but the tip is difficult to visualize.  Again noted are prominent interstitial markings concerning for congestion or mild edema. Improved aeration at the left lung base.  Heart size remains upper limits of normal.  IMPRESSION: Slightly improved aeration at the left lung base.  Prominent interstitial and vascular markings are compatible with mild  edema.  Support apparatuses as described.   Original Report Authenticated By: Richarda Overlie, M.D.    Medications:  Prior to Admission:  Prescriptions prior to admission  Medication Sig Dispense Refill  . diazepam (VALIUM) 10 MG tablet Take 10 mg by mouth at bedtime.      Marland Kitchen diltiazem (CARDIZEM SR) 120 MG 12 hr capsule Take 1 capsule (120 mg total) by mouth every 12 (twelve) hours. NEW MEDICATION TO CONTROL YOUR HEART RATE.  60 capsule  2  . furosemide (LASIX) 40 MG tablet Take 40 mg by mouth 2 (two)  times daily.      Marland Kitchen glimepiride (AMARYL) 2 MG tablet Take 2 mg by mouth daily before breakfast.      . lisinopril (PRINIVIL,ZESTRIL) 40 MG tablet Take 40 mg by mouth daily.      . potassium chloride (K-DUR) 10 MEQ tablet Take 10 mEq by mouth daily.      . propranolol ER (INDERAL LA) 60 MG 24 hr capsule Take 60 mg by mouth daily.      . QUEtiapine (SEROQUEL) 300 MG tablet Take 300 mg by mouth at bedtime.      Marland Kitchen warfarin (COUMADIN) 5 MG tablet Take 5 mg by mouth daily.      Marland Kitchen HYDROcodone-acetaminophen (NORCO) 10-325 MG per tablet Take 1 tablet by mouth every 4 (four) hours as needed. For pain      . simvastatin (ZOCOR) 80 MG tablet Take 80 mg by mouth at bedtime.      . [DISCONTINUED] propranolol ER (INDERAL LA) 60 MG 24 hr capsule Take 60 mg by mouth daily.       Scheduled: . ipratropium  0.5 mg Nebulization Q4H   And  . albuterol  2.5 mg Nebulization Q4H  . antiseptic oral rinse  15 mL Mouth Rinse q12n4p  . antiseptic oral rinse  15 mL Mouth Rinse QID  . chlorhexidine  15 mL Mouth Rinse BID  . diltiazem  15 mg Intravenous Q6H  . furosemide  40 mg Intravenous Q12H  . gabapentin  600 mg Oral Q8H  . insulin aspart  0-15 Units Subcutaneous Q4H  . levofloxacin (LEVAQUIN) IV  750 mg Intravenous Q24H  . methylPREDNISolone (SOLU-MEDROL) injection  80 mg Intravenous Q6H  . pantoprazole (PROTONIX) IV  40 mg Intravenous Q24H  . QUEtiapine  150 mg Oral QHS  . sodium chloride  3 mL Intravenous Q12H    Continuous: . fentaNYL infusion INTRAVENOUS 50 mcg/hr (12/02/12 0639)  . heparin 1,450 Units/hr (12/02/12 4098)  . propofol 20 mcg/kg/min (12/02/12 0719)   JXB:JYNWGN chloride, acetaminophen, acetaminophen, fentaNYL, LORazepam, sodium chloride  Assesment: He has acute on chronic respiratory failure. He was extubated yesterday but failed and had to be reintubated later in the day. He has severe COPD. He has sleep apnea. He has history of a CVA has diabetes atrial fibrillation and chronic diastolic heart failure. He has behavioral abnormalities and has been very uncooperative. I think what it comes time to extubate him were going to have to simply extubate him rather than going through the usual extubation process because I think if he gets his sedation turned down significantly he will immediately begin trying to pull out IVs endotracheal tube Melvern Banker. I don't think it's in his best interest to try to extubate today considering the events of yesterday Principal Problem:   Acute-on-chronic respiratory failure Active Problems:   Obstructive sleep apnea   COPD (chronic obstructive pulmonary disease)   CVA (cerebral infarction)   DM (diabetes mellitus)   CHF (congestive heart failure)   HTN (hypertension)   Paroxysmal a-fib   Chronic respiratory failure with hypoxia   Chronic diastolic heart failure   Hypothermia    Plan: Continue on the ventilator today perhaps attempt extubation tomorrow. If we can get og-tube back in to start tube feedings    LOS: 3 days   Adrien Dietzman L 12/02/2012, 7:57 AM

## 2012-12-03 ENCOUNTER — Inpatient Hospital Stay (HOSPITAL_COMMUNITY): Payer: Medicare Other

## 2012-12-03 ENCOUNTER — Encounter (HOSPITAL_COMMUNITY): Payer: Medicare Other

## 2012-12-03 DIAGNOSIS — E119 Type 2 diabetes mellitus without complications: Secondary | ICD-10-CM

## 2012-12-03 DIAGNOSIS — E870 Hyperosmolality and hypernatremia: Secondary | ICD-10-CM | POA: Diagnosis not present

## 2012-12-03 DIAGNOSIS — J441 Chronic obstructive pulmonary disease with (acute) exacerbation: Secondary | ICD-10-CM | POA: Diagnosis present

## 2012-12-03 LAB — GLUCOSE, CAPILLARY
Glucose-Capillary: 212 mg/dL — ABNORMAL HIGH (ref 70–99)
Glucose-Capillary: 241 mg/dL — ABNORMAL HIGH (ref 70–99)
Glucose-Capillary: 251 mg/dL — ABNORMAL HIGH (ref 70–99)
Glucose-Capillary: 258 mg/dL — ABNORMAL HIGH (ref 70–99)
Glucose-Capillary: 271 mg/dL — ABNORMAL HIGH (ref 70–99)
Glucose-Capillary: 275 mg/dL — ABNORMAL HIGH (ref 70–99)
Glucose-Capillary: 316 mg/dL — ABNORMAL HIGH (ref 70–99)

## 2012-12-03 LAB — BASIC METABOLIC PANEL
CO2: 36 mEq/L — ABNORMAL HIGH (ref 19–32)
Chloride: 102 mEq/L (ref 96–112)
Glucose, Bld: 295 mg/dL — ABNORMAL HIGH (ref 70–99)
Sodium: 147 mEq/L — ABNORMAL HIGH (ref 135–145)

## 2012-12-03 LAB — CBC
Hemoglobin: 11.7 g/dL — ABNORMAL LOW (ref 13.0–17.0)
MCV: 100.5 fL — ABNORMAL HIGH (ref 78.0–100.0)
Platelets: 198 10*3/uL (ref 150–400)
RBC: 3.67 MIL/uL — ABNORMAL LOW (ref 4.22–5.81)
WBC: 5.2 10*3/uL (ref 4.0–10.5)

## 2012-12-03 MED ORDER — DILTIAZEM HCL 60 MG PO TABS
60.0000 mg | ORAL_TABLET | Freq: Three times a day (TID) | ORAL | Status: DC
  Administered 2012-12-03: 60 mg via ORAL
  Filled 2012-12-03: qty 1

## 2012-12-03 MED ORDER — QUETIAPINE FUMARATE 25 MG PO TABS
150.0000 mg | ORAL_TABLET | Freq: Every day | ORAL | Status: DC
  Filled 2012-12-03 (×2): qty 2

## 2012-12-03 MED ORDER — POTASSIUM CHLORIDE CRYS ER 20 MEQ PO TBCR
20.0000 meq | EXTENDED_RELEASE_TABLET | Freq: Two times a day (BID) | ORAL | Status: DC
  Administered 2012-12-03: 20 meq via ORAL
  Filled 2012-12-03: qty 1

## 2012-12-03 MED ORDER — POTASSIUM CHLORIDE 20 MEQ/15ML (10%) PO LIQD
20.0000 meq | Freq: Two times a day (BID) | ORAL | Status: DC
  Administered 2012-12-03 – 2012-12-05 (×4): 20 meq via ORAL
  Filled 2012-12-03 (×4): qty 30

## 2012-12-03 MED ORDER — DILTIAZEM HCL 25 MG/5ML IV SOLN
15.0000 mg | Freq: Four times a day (QID) | INTRAVENOUS | Status: DC
  Administered 2012-12-04 – 2012-12-05 (×6): 15 mg via INTRAVENOUS
  Filled 2012-12-03 (×7): qty 5

## 2012-12-03 MED ORDER — POTASSIUM CHLORIDE 2 MEQ/ML IV SOLN
INTRAVENOUS | Status: DC
  Administered 2012-12-03 – 2012-12-04 (×3): via INTRAVENOUS
  Filled 2012-12-03 (×9): qty 1000

## 2012-12-03 MED ORDER — SODIUM CHLORIDE 0.9 % IJ SOLN
10.0000 mL | INTRAMUSCULAR | Status: DC | PRN
  Administered 2012-12-03: 40 mL
  Administered 2012-12-03: 20 mL
  Administered 2012-12-05: 40 mL

## 2012-12-03 MED ORDER — INSULIN GLARGINE 100 UNIT/ML ~~LOC~~ SOLN
17.0000 [IU] | Freq: Every day | SUBCUTANEOUS | Status: DC
  Administered 2012-12-03: 17 [IU] via SUBCUTANEOUS
  Filled 2012-12-03 (×2): qty 0.17

## 2012-12-03 MED ORDER — SODIUM CHLORIDE 0.9 % IJ SOLN
10.0000 mL | Freq: Two times a day (BID) | INTRAMUSCULAR | Status: DC
  Administered 2012-12-03: 40 mL
  Administered 2012-12-04 (×2): 10 mL

## 2012-12-03 MED ORDER — FUROSEMIDE 20 MG PO TABS
20.0000 mg | ORAL_TABLET | Freq: Two times a day (BID) | ORAL | Status: DC
  Administered 2012-12-04 – 2012-12-05 (×3): 20 mg via ORAL
  Filled 2012-12-03 (×3): qty 1

## 2012-12-03 NOTE — Clinical Social Work Note (Signed)
CSW reviewed chart, patient remains sedated and on vent.  Unable to get patient permission to seek SNF bed.  Will continue to monitor progress and assist as appropriate.  CSW aware that roommate has stated patient cannot return to apartment at discharge.  Santa Genera, LCSW Clinical Social Worker [phone])

## 2012-12-03 NOTE — Progress Notes (Signed)
ANTICOAGULATION CONSULT NOTE   Pharmacy Consult for Heparin Indication: atrial fibrillation  Allergies  Allergen Reactions  . Flexeril [Cyclobenzaprine] Other (See Comments)    Alters Mental Status-ALL MUSCLE RELAXERS  . Muscle Relief [Capsaicin]     Hysteria    Patient Measurements: Height: 5\' 8"  (172.7 cm) Weight: 269 lb 2.9 oz (122.1 kg) IBW/kg (Calculated) : 68.4 Heparin Dosing Weight: 89Kg  Vital Signs: Temp: 98.2 F (36.8 C) (09/17 0738) Temp src: Oral (09/17 0738) BP: 119/78 mmHg (09/17 0630) Pulse Rate: 85 (09/17 0630)  Labs:  Recent Labs  12/01/12 0428 12/02/12 0503 12/03/12 0534  HGB 11.2*  --  11.7*  HCT 35.7*  --  36.9*  PLT 223  --  198  HEPARINUNFRC 0.42 0.75* 0.42  CREATININE 1.39*  --  1.19   Estimated Creatinine Clearance: 85 ml/min (by C-G formula based on Cr of 1.19).  Medical History: Past Medical History  Diagnosis Date  . Hypertension   . Diabetes mellitus   . Bipolar 1 disorder   . Hyperlipidemia   . Atrial fibrillation   . COPD (chronic obstructive pulmonary disease)   . Obstructive sleep apnea   . Chronic diastolic heart failure     Echocardiogram 05/14/11: Mild LVH, EF 50-55%, no wall motion abnormalities, grade 2 diastolic dysfunction.  . Chest pain     Myoview 05/17/11 demonstrated an EF of 61%, small fixed inferoseptal defect consistent with thinning vs prior infarct; no ischemia. - low risk  . Diabetic neuropathy   . Chronic back pain   . History of stroke   . Mental disorder     bipolar  . CHF (congestive heart failure)   . Stroke   . Neuromuscular disorder     diabetic neuropathy  . Chronic respiratory failure with hypoxia 08/08/2012  . Noncompliance with medication regimen   . Tobacco abuse    Medications:  Scheduled:  . ipratropium  0.5 mg Nebulization Q4H   And  . albuterol  2.5 mg Nebulization Q4H  . antiseptic oral rinse  15 mL Mouth Rinse q12n4p  . antiseptic oral rinse  15 mL Mouth Rinse QID  . chlorhexidine   15 mL Mouth Rinse BID  . diltiazem  15 mg Intravenous Q6H  . feeding supplement (OXEPA)  1,000 mL Per Tube Q24H  . furosemide  40 mg Intravenous Q12H  . gabapentin  600 mg Oral Q8H  . insulin aspart  0-20 Units Subcutaneous Q4H  . levofloxacin (LEVAQUIN) IV  750 mg Intravenous Q24H  . methylPREDNISolone (SOLU-MEDROL) injection  60 mg Intravenous Q24H  . pantoprazole (PROTONIX) IV  40 mg Intravenous Q24H  . QUEtiapine  150 mg Oral QHS  . sodium chloride  3 mL Intravenous Q12H    Assessment: 57yo male with h/o afib on chronic warfarin.  INR at baseline on admission.  Patient admitted with respiratory failure currently vent-dependent.  Heparin bridge for afib while patient is NPO.   Heparin level is therapeutic today. No bleeding noted.   Goal of Therapy:  Heparin level 0.3-0.7 units/ml Monitor platelets by anticoagulation protocol: Yes   Plan:  Continue Heparin at 1350 units/hr Heparin level & CBC daily while on heparin.  Valrie Hart A 12/03/2012,8:06 AM

## 2012-12-03 NOTE — Progress Notes (Signed)
The is intubated and has multiple medications to be given PO or via his OG tube; 3 of 4 medications are in pill/tablet form and would have to be crushed.  However, the aforementioned 3 pills are DO NOT CRUSH medications.  The hospitalist paged via AMION.

## 2012-12-03 NOTE — Progress Notes (Signed)
Inpatient Diabetes Program Recommendations  AACE/ADA: New Consensus Statement on Inpatient Glycemic Control (2013)  Target Ranges:  Prepandial:   less than 140 mg/dL      Peak postprandial:   less than 180 mg/dL (1-2 hours)      Critically ill patients:  140 - 180 mg/dL   Results for Sean Warren, Sean Warren (MRN [id number]) as of 12/03/2012 09:39  Ref. Range 12/02/2012 06:51 12/02/2012 07:41 12/02/2012 11:11 12/02/2012 16:24 12/02/2012 20:09 12/03/2012 00:49 12/03/2012 05:25 12/03/2012 07:36  Glucose-Capillary Latest Range: 70-99 mg/dL 528 (H) 413 (H) 244 (H) 238 (H) 329 (H) 275 (H) 251 (H) 267 (H)   Inpatient Diabetes Program Recommendations Insulin - Basal: Please consider ordering basal insulin.  Recommend starting with Lantus 15 units daily.   Note: Blood glucose over the past 24 hours has ranged from 166-329 mg/dl.  Noted that Solumedrol was decreased to 60 mg Q24H, Novolog correction was increased to resistant scale, and tube feeding were started yesterday.  Despite decrease in steroids and increase in Novolog correction blood glucose remains elevated.  Please consider ordering basal insulin.  Thanks, Orlando Penner, RN, MSN, CCRN Diabetes Coordinator Inpatient Diabetes Program [phone] (Team Pager) [phone] ([facility] office) [phone] [facility] office)

## 2012-12-03 NOTE — Progress Notes (Signed)
Subjective: He is overall about the same he remains intubated and on the ventilator. He is still on 50% oxygen and his O2 saturations are marginal  Objective: Vital signs in last 24 hours: Temp:  [97.7 F (36.5 C)-98.4 F (36.9 C)] 98.2 F (36.8 C) (09/17 0738) Pulse Rate:  [69-116] 85 (09/17 0630) Resp:  [11-24] 14 (09/17 0630) BP: (113-169)/(56-112) 119/78 mmHg (09/17 0630) SpO2:  [90 %-99 %] 93 % (09/17 0729) FiO2 (%):  [50 %-60 %] 50 % (09/17 0729) Weight change:  Last BM Date: 11/30/12  Intake/Output from previous day: 09/16 0701 - 09/17 0700 In: 2226.5 [I.V.:1446.5; NG/GT:630; IV Piggyback:150] Out: 3550 [Urine:3550]  PHYSICAL EXAM General appearance: morbidly obese and Intubated on the ventilator and sedated Resp: rhonchi bilaterally Cardio: irregularly irregular rhythm GI: soft, non-tender; bowel sounds normal; no masses,  no organomegaly Extremities: extremities normal, atraumatic, no cyanosis or edema  Lab Results:    Basic Metabolic Panel:  Recent Labs  16/10/96 0428 12/03/12 0534  NA 146* 147*  K 3.3* 3.1*  CL 101 102  CO2 35* 36*  GLUCOSE 242* 295*  BUN 34* 33*  CREATININE 1.39* 1.19  CALCIUM 9.1 8.7   Liver Function Tests: No results found for this basename: AST, ALT, ALKPHOS, BILITOT, PROT, ALBUMIN,  in the last 72 hours No results found for this basename: LIPASE, AMYLASE,  in the last 72 hours No results found for this basename: AMMONIA,  in the last 72 hours CBC:  Recent Labs  12/01/12 0428 12/03/12 0534  WBC 7.1 5.2  HGB 11.2* 11.7*  HCT 35.7* 36.9*  MCV 99.4 100.5*  PLT 223 198   Cardiac Enzymes: No results found for this basename: CKTOTAL, CKMB, CKMBINDEX, TROPONINI,  in the last 72 hours BNP: No results found for this basename: PROBNP,  in the last 72 hours D-Dimer: No results found for this basename: DDIMER,  in the last 72 hours CBG:  Recent Labs  12/02/12 1111 12/02/12 1624 12/02/12 2009 12/03/12 0049 12/03/12 0525  12/03/12 0736  GLUCAP 209* 238* 329* 275* 251* 267*   Hemoglobin A1C: No results found for this basename: HGBA1C,  in the last 72 hours Fasting Lipid Panel: No results found for this basename: CHOL, HDL, LDLCALC, TRIG, CHOLHDL, LDLDIRECT,  in the last 72 hours Thyroid Function Tests: No results found for this basename: TSH, T4TOTAL, FREET4, T3FREE, THYROIDAB,  in the last 72 hours Anemia Panel: No results found for this basename: VITAMINB12, FOLATE, FERRITIN, TIBC, IRON, RETICCTPCT,  in the last 72 hours Coagulation: No results found for this basename: LABPROT, INR,  in the last 72 hours Urine Drug Screen: Drugs of Abuse     Component Value Date/Time   LABOPIA NONE DETECTED 11/29/2012 1357   LABOPIA NEG 08/26/2007 2017   COCAINSCRNUR NONE DETECTED 11/29/2012 1357   COCAINSCRNUR NEG 08/26/2007 2017   LABBENZ POSITIVE* 11/29/2012 1357   LABBENZ NEG 08/26/2007 2017   AMPHETMU NONE DETECTED 11/29/2012 1357   AMPHETMU NEG 08/26/2007 2017   THCU NONE DETECTED 11/29/2012 1357   LABBARB POSITIVE* 11/29/2012 1357    Alcohol Level: No results found for this basename: ETH,  in the last 72 hours Urinalysis: No results found for this basename: COLORURINE, APPERANCEUR, LABSPEC, PHURINE, GLUCOSEU, HGBUR, BILIRUBINUR, KETONESUR, PROTEINUR, UROBILINOGEN, NITRITE, LEUKOCYTESUR,  in the last 72 hours Misc. Labs:  ABGS  Recent Labs  12/03/12 0526  PHART 7.454*  PO2ART 59.8*  TCO2 33.1  HCO3 38.7*   CULTURES Recent Results (from the past 240 hour(s))  URINE CULTURE     Status: None   Collection Time    11/29/12 12:37 PM      Result Value Range Status   Specimen Description URINE, CATHETERIZED   Final   Special Requests NONE   Final   Culture  Setup Time     Final   Value: 11/29/2012 21:50     Performed at [facility] Count     Final   Value: NO GROWTH     Performed at [facility]   Culture     Final   Value: NO GROWTH     Performed at [facility]    Report Status 11/30/2012 FINAL   Final  CULTURE, RESPIRATORY (NON-EXPECTORATED)     Status: None   Collection Time    11/29/12  1:05 PM      Result Value Range Status   Specimen Description TRACHEAL ASPIRATE   Final   Special Requests NONE   Final   Gram Stain     Final   Value: MODERATE WBC PRESENT, PREDOMINANTLY PMN     RARE SQUAMOUS EPITHELIAL CELLS PRESENT     NO ORGANISMS SEEN     Performed at [facility]   Culture     Final   Value: FEW MORAXELLA CATARRHALIS(BRANHAMELLA)     Note: BETA LACTAMASE POSITIVE     Performed at [facility]   Report Status 12/02/2012 FINAL   Final   Studies/Results: Dg Chest 1v Repeat Same Day  12/01/2012   CLINICAL DATA:  Respiratory failure and re-intubation.  EXAM: CHEST - 1 VIEW SAME DAY  COMPARISON:  12/01/2012  FINDINGS: Endotracheal tube present which is within 1 cm of the carina. Lungs show bibasilar atelectasis. No pulmonary edema, airspace consolidation or pleural fluid is identified. There is stable cardiomegaly.  IMPRESSION: Endotracheal tube tip within 1 cm of the carina.   Electronically Signed   By: Irish Lack   On: 12/01/2012 18:17   Dg Chest Port 1 View  12/02/2012   *RADIOLOGY REPORT*  Clinical Data: Respiratory failure.  Respiratory distress.  PORTABLE CHEST - 1 VIEW  Comparison: 12/01/2012.  Findings: Cardiomegaly.  Endotracheal tube remains present with the tip 36 mm from the carina.  No focal consolidation.  Basilar atelectasis. Monitoring leads are projected over the chest. No pneumothorax. Patient rotated to the right.  IMPRESSION:  1.  Endotracheal tube tip 36 mm from the carina. 2.  Cardiomegaly without failure.   Original Report Authenticated By: Andreas Newport, M.D.    Medications:  Continuous: . fentaNYL infusion INTRAVENOUS 50 mcg/hr (12/03/12 0600)  . heparin 1,350 Units/hr (12/03/12 0600)  . propofol 50 mcg/kg/min (12/03/12 1610)    Assesment: He has acute on chronic respiratory failure I think on  the basis of COPD exacerbation. He does have some chronic diastolic heart failure but that does not appear to be a big part of the problem now. He's had a previous stroke. He has sleep apnea. He has obesity. He is hypernatremic now. He has some sort of a behavioral disturbance but I don't think is well defined but he becomes extremely agitated and combative when he comes off sedation Principal Problem:   Acute-on-chronic respiratory failure Active Problems:   Obstructive sleep apnea   COPD (chronic obstructive pulmonary disease)   CVA (cerebral infarction)   DM (diabetes mellitus)   CHF (congestive heart failure)   HTN (hypertension)   Hypokalemia   Paroxysmal a-fib   Chronic  respiratory failure with hypoxia   Chronic diastolic heart failure   Hypothermia   Hypernatremia    Plan: Continue treatments I don't think he can come off the ventilator yet    LOS: 4 days   Sean Warren 12/03/2012, 8:13 AM

## 2012-12-03 NOTE — Progress Notes (Signed)
Called by nursing staff to let me know that some of the medications prescribed are not able to be crushed. I have adjusted these medications: Cardizem to be given IV again for the time being and potassium can be given in liquid form. Seroquel has been discontinued for the time being.

## 2012-12-03 NOTE — Progress Notes (Signed)
[facility] PROGRESS NOTE  CALLOWAY ANDRUS ZOX:[id number] DOB: 28-Nov-1955 DOA: 11/29/2012 PCP: [name], MD    Code Status: Full code. Family Communication: family not available. Disposition Plan: to be determined.   Consultants:  Pulmonologist, Dr. [name].  Procedures:  Intubation and mechanical ventilation 11/29/2012, then extubated, then re\re intubated.  Antibiotics:  IV Levaquin.  HPI/Subjective: The patient is intubated. He is sedated.  Objective: Filed Vitals:   12/03/12 0738  BP:   Pulse:   Temp: 98.2 F (36.8 C)  Resp:    Blood pressure 119/78. Respiratory rate 14. Pulse 85. Oxygen saturation 93%.   Intake/Output Summary (Last 24 hours) at 12/03/12 1001 Last data filed at 12/03/12 0835  Gross per 24 hour  Intake 2167.94 ml  Output   3550 ml  Net -1382.06 ml   Filed Weights   11/29/12 1404 11/30/12 0500 12/01/12 0500  Weight: 129 kg (284 lb 6.3 oz) 121.1 kg (266 lb 15.6 oz) 122.1 kg (269 lb 2.9 oz)    Exam:   General:  Obese large framed 57 year old Caucasian man sedated on the ventilator.  Cardiovascular: irregular, irregular.  Respiratory: few scattered crackles auscultated anteriorly.  Abdomen: obese, positive bowel sounds, nontender, nondistended.  Musculoskeletal: no acute hot red joints.  Neurologic: He is sedated on propofol. Cranial nerves II through XII are intact grossly.  Data Reviewed: Basic Metabolic Panel:  Recent Labs Lab 11/27/12 0924 11/29/12 1100 11/30/12 0529 12/01/12 0428 12/03/12 0534  NA 142 142 144 146* 147*  K 4.5 4.6 3.8 3.3* 3.1*  CL 102 97 99 101 102  CO2 38* 38* 34* 35* 36*  GLUCOSE 150* 212* 207* 242* 295*  BUN 12 21 28* 34* 33*  CREATININE 0.90 1.08 1.28 1.39* 1.19  CALCIUM 9.3 9.5 9.1 9.1 8.7   Liver Function Tests:  Recent Labs Lab 11/27/12 0924 11/29/12 1100  AST 8 10  ALT 11 13  ALKPHOS 109 120*  BILITOT 0.3 0.3  PROT 6.8 7.5  ALBUMIN 3.0* 3.3*   No results found for  this basename: LIPASE, AMYLASE,  in the last 168 hours  Recent Labs Lab 11/29/12 1101  AMMONIA 20   CBC:  Recent Labs Lab 11/27/12 0924 11/29/12 1100 12/01/12 0428 12/03/12 0534  WBC 10.6* 8.8 7.1 5.2  NEUTROABS 8.7* 7.0  --   --   HGB 11.6* 13.7 11.2* 11.7*  HCT 37.8* 44.1 35.7* 36.9*  MCV 104.4* 102.8* 99.4 100.5*  PLT 215 197 223 198   Cardiac Enzymes:  Recent Labs Lab 11/27/12 0924  TROPONINI <0.30   BNP (last 3 results)  Recent Labs  10/31/12 1355 11/17/12 2032 11/27/12 0924  PROBNP 1622.0* 1300.0* 2577.0*   CBG:  Recent Labs Lab 12/02/12 1624 12/02/12 2009 12/03/12 0049 12/03/12 0525 12/03/12 0736  GLUCAP 238* 329* 275* 251* 267*    Recent Results (from the past 240 hour(s))  URINE CULTURE     Status: None   Collection Time    11/29/12 12:37 PM      Result Value Range Status   Specimen Description URINE, CATHETERIZED   Final   Special Requests NONE   Final   Culture  Setup Time     Final   Value: 11/29/2012 21:50     Performed at [facility] Count     Final   Value: NO GROWTH     Performed at [facility]   Culture     Final   Value: NO GROWTH  Performed at [facility]   Report Status 11/30/2012 FINAL   Final  CULTURE, RESPIRATORY (NON-EXPECTORATED)     Status: None   Collection Time    11/29/12  1:05 PM      Result Value Range Status   Specimen Description TRACHEAL ASPIRATE   Final   Special Requests NONE   Final   Gram Stain     Final   Value: MODERATE WBC PRESENT, PREDOMINANTLY PMN     RARE SQUAMOUS EPITHELIAL CELLS PRESENT     NO ORGANISMS SEEN     Performed at [facility]   Culture     Final   Value: FEW MORAXELLA CATARRHALIS(BRANHAMELLA)     Note: BETA LACTAMASE POSITIVE     Performed at [facility]   Report Status 12/02/2012 FINAL   Final     Studies: Dg Chest 1v Repeat Same Day  12/01/2012   CLINICAL DATA:  Respiratory failure and re-intubation.  EXAM: CHEST - 1  VIEW SAME DAY  COMPARISON:  12/01/2012  FINDINGS: Endotracheal tube present which is within 1 cm of the carina. Lungs show bibasilar atelectasis. No pulmonary edema, airspace consolidation or pleural fluid is identified. There is stable cardiomegaly.  IMPRESSION: Endotracheal tube tip within 1 cm of the carina.   Electronically Signed   By: Irish Lack   On: 12/01/2012 18:17   Dg Chest Port 1 View  12/02/2012   *RADIOLOGY REPORT*  Clinical Data: Respiratory failure.  Respiratory distress.  PORTABLE CHEST - 1 VIEW  Comparison: 12/01/2012.  Findings: Cardiomegaly.  Endotracheal tube remains present with the tip 36 mm from the carina.  No focal consolidation.  Basilar atelectasis. Monitoring leads are projected over the chest. No pneumothorax. Patient rotated to the right.  IMPRESSION:  1.  Endotracheal tube tip 36 mm from the carina. 2.  Cardiomegaly without failure.   Original Report Authenticated By: Andreas Newport, M.D.    Scheduled Meds: . ipratropium  0.5 mg Nebulization Q4H   And  . albuterol  2.5 mg Nebulization Q4H  . antiseptic oral rinse  15 mL Mouth Rinse q12n4p  . antiseptic oral rinse  15 mL Mouth Rinse QID  . chlorhexidine  15 mL Mouth Rinse BID  . diltiazem  15 mg Intravenous Q6H  . feeding supplement (OXEPA)  1,000 mL Per Tube Q24H  . furosemide  40 mg Intravenous Q12H  . gabapentin  600 mg Oral Q8H  . insulin aspart  0-20 Units Subcutaneous Q4H  . levofloxacin (LEVAQUIN) IV  750 mg Intravenous Q24H  . methylPREDNISolone (SOLU-MEDROL) injection  60 mg Intravenous Q24H  . pantoprazole (PROTONIX) IV  40 mg Intravenous Q24H  . QUEtiapine  150 mg Oral QHS  . sodium chloride  3 mL Intravenous Q12H   Continuous Infusions: . fentaNYL infusion INTRAVENOUS 50 mcg/hr (12/03/12 0600)  . heparin 1,350 Units/hr (12/03/12 0600)  . propofol 50 mcg/kg/min (12/03/12 0920)    Assessment:  Principal Problem:   COPD exacerbation Active Problems:   Acute-on-chronic respiratory  failure   Hypokalemia   Paroxysmal a-fib   Obstructive sleep apnea   CVA (cerebral infarction)   DM (diabetes mellitus)   HTN (hypertension)   Obesity (BMI 30.0-34.9)   Chronic respiratory failure with hypoxia   Chronic diastolic heart failure   Hypothermia   Hypernatremia  1. COPD with exacerbation. We'll continue Levaquin, steroids, and bronchodilators.  Acute ventilator respiratory failure superimposed on chronic respiratory failure. ABG with overall improved PCO2, but decrease in PO2. Appreciate  Dr. [name] input. Adjustments in ventilator settings per Dr. [name].  Obstructive sleep apnea. Treatment as above.   Paroxysmal atrial fibrillation. Currently rate controlled on every 6 hour IV diltiazem. On chronic Coumadin, being held. On IV heparin instead.  Hypertension. Currently controlled. On diltiazem and propanolol chronically.  Chronic diastolic heart failure. On IV Lasix currently, but chest x-ray reveals no pulmonary edema. We'll decrease dosing in light of hypernatremia.  Hypernatremia. Likely secondary to hypovolemia.  Hypokalemia. Will need repletion.  Diabetes mellitus. Capillary glucose elevated in the setting of IV steroids. Will adjust insulin accordingly.  Nutrition: On physical tube feedings.  DVT prophylaxis. On full dose IV heparin  GI prophylaxis: On protonic.   Plan: 1. Will decrease Lasix to 20 mg twice a day via tube. Will start gentle IV fluids with potassium added in the setting of hypernatremia. 2. PICC line for better IV access. 3. Change diltiazem from IV to via tube. 4. Replete potassium chloride via tube. We'll check a magnesium level. 5. We'll start Lantus.   Time spent: Critical care time: 45 minutes.    [facility]  [facility] Pager [phone] If 7PM-7AM, please contact night-coverage at [email or web address], password [facility] 12/03/2012, 10:01 AM  LOS: 4 days

## 2012-12-04 ENCOUNTER — Inpatient Hospital Stay (HOSPITAL_COMMUNITY): Payer: Medicare Other

## 2012-12-04 DIAGNOSIS — J441 Chronic obstructive pulmonary disease with (acute) exacerbation: Secondary | ICD-10-CM

## 2012-12-04 LAB — MAGNESIUM: Magnesium: 2.5 mg/dL (ref 1.5–2.5)

## 2012-12-04 LAB — COMPREHENSIVE METABOLIC PANEL
ALT: <5 U/L (ref 0–53)
Alkaline Phosphatase: 63 U/L (ref 39–117)
BUN: 28 mg/dL — ABNORMAL HIGH (ref 6–23)
CO2: 38 mEq/L — ABNORMAL HIGH (ref 19–32)
Chloride: 101 mEq/L (ref 96–112)
GFR calc Af Amer: 86 mL/min — ABNORMAL LOW (ref >90)
Glucose, Bld: 238 mg/dL — ABNORMAL HIGH (ref 70–99)
Potassium: 3.6 mEq/L (ref 3.5–5.1)
Sodium: 143 mEq/L (ref 135–145)
Total Bilirubin: 0.3 mg/dL (ref 0.3–1.2)
Total Protein: 5.5 g/dL — ABNORMAL LOW (ref 6.0–8.3)

## 2012-12-04 LAB — BLOOD GAS, ARTERIAL
MECHVT: 550 mL
PEEP: 5 cmH2O
Patient temperature: 37
RATE: 14 resp/min
TCO2: 33.2 mmol/L (ref 0–100)
pCO2 arterial: 53.8 mmHg — ABNORMAL HIGH (ref 35.0–45.0)
pH, Arterial: 7.447 (ref 7.350–7.450)

## 2012-12-04 LAB — GLUCOSE, CAPILLARY: Glucose-Capillary: 241 mg/dL — ABNORMAL HIGH (ref 70–99)

## 2012-12-04 LAB — CBC
HCT: 37.1 % — ABNORMAL LOW (ref 39.0–52.0)
MCHC: 31.5 g/dL (ref 30.0–36.0)
Platelets: 173 10*3/uL (ref 150–400)
RDW: 13.6 % (ref 11.5–15.5)
WBC: 5.8 10*3/uL (ref 4.0–10.5)

## 2012-12-04 LAB — HEPARIN LEVEL (UNFRACTIONATED): Heparin Unfractionated: 0.36 IU/mL (ref 0.30–0.70)

## 2012-12-04 LAB — HEMOGLOBIN A1C: Mean Plasma Glucose: 183 mg/dL — ABNORMAL HIGH (ref <117)

## 2012-12-04 MED ORDER — INSULIN GLARGINE 100 UNIT/ML ~~LOC~~ SOLN
25.0000 [IU] | Freq: Every day | SUBCUTANEOUS | Status: DC
  Administered 2012-12-04: 25 [IU] via SUBCUTANEOUS
  Filled 2012-12-04 (×4): qty 0.25

## 2012-12-04 NOTE — Progress Notes (Signed)
ANTICOAGULATION CONSULT NOTE   Pharmacy Consult for Heparin Indication: atrial fibrillation  Allergies  Allergen Reactions  . Flexeril [Cyclobenzaprine] Other (See Comments)    Alters Mental Status-ALL MUSCLE RELAXERS  . Muscle Relief [Capsaicin]     Hysteria    Patient Measurements: Height: 5\' 8"  (172.7 cm) Weight: 264 lb 12.4 oz (120.1 kg) IBW/kg (Calculated) : 68.4 Heparin Dosing Weight: 89Kg  Vital Signs: Temp: 97.1 F (36.2 C) (09/18 0739) Temp src: Core (Comment) (09/18 0739) BP: 131/85 mmHg (09/18 0600) Pulse Rate: 96 (09/18 0530)  Labs:  Recent Labs  12/02/12 0503 12/03/12 0534 12/04/12 0506  HGB  --  11.7* 11.7*  HCT  --  36.9* 37.1*  PLT  --  198 173  HEPARINUNFRC 0.75* 0.42 0.36  CREATININE  --  1.19 1.07   Estimated Creatinine Clearance: 93.7 ml/min (by C-G formula based on Cr of 1.07).  Medical History: Past Medical History  Diagnosis Date  . Hypertension   . Diabetes mellitus   . Bipolar 1 disorder   . Hyperlipidemia   . Atrial fibrillation   . COPD (chronic obstructive pulmonary disease)   . Obstructive sleep apnea   . Chronic diastolic heart failure     Echocardiogram 05/14/11: Mild LVH, EF 50-55%, no wall motion abnormalities, grade 2 diastolic dysfunction.  . Chest pain     Myoview 05/17/11 demonstrated an EF of 61%, small fixed inferoseptal defect consistent with thinning vs prior infarct; no ischemia. - low risk  . Diabetic neuropathy   . Chronic back pain   . History of stroke   . Mental disorder     bipolar  . CHF (congestive heart failure)   . Stroke   . Neuromuscular disorder     diabetic neuropathy  . Chronic respiratory failure with hypoxia 08/08/2012  . Noncompliance with medication regimen   . Tobacco abuse    Medications:  Scheduled:  . ipratropium  0.5 mg Nebulization Q4H   And  . albuterol  2.5 mg Nebulization Q4H  . antiseptic oral rinse  15 mL Mouth Rinse q12n4p  . antiseptic oral rinse  15 mL Mouth Rinse QID  .  chlorhexidine  15 mL Mouth Rinse BID  . diltiazem  15 mg Intravenous Q6H  . feeding supplement (OXEPA)  1,000 mL Per Tube Q24H  . furosemide  20 mg Oral BID  . gabapentin  600 mg Oral Q8H  . insulin aspart  0-20 Units Subcutaneous Q4H  . insulin glargine  17 Units Subcutaneous QHS  . levofloxacin (LEVAQUIN) IV  750 mg Intravenous Q24H  . methylPREDNISolone (SOLU-MEDROL) injection  60 mg Intravenous Q24H  . pantoprazole (PROTONIX) IV  40 mg Intravenous Q24H  . potassium chloride  20 mEq Oral BID  . sodium chloride  10-40 mL Intracatheter Q12H  . sodium chloride  3 mL Intravenous Q12H   Assessment: 57yo male with h/o afib on chronic warfarin.  INR at baseline on admission.  Patient admitted with respiratory failure currently vent-dependent.  Heparin bridge for afib while patient is NPO.   Heparin level is therapeutic today. No bleeding noted.   Goal of Therapy:  Heparin level 0.3-0.7 units/ml Monitor platelets by anticoagulation protocol: Yes   Plan:  Continue Heparin at 1350 units/hr Heparin level & CBC daily while on heparin.  Valrie Hart A 12/04/2012,7:52 AM

## 2012-12-04 NOTE — Care Management Note (Signed)
    Page 1 of 1   12/05/2012     3:09:11 PM   CARE MANAGEMENT NOTE 12/05/2012  Patient:  Sean Warren, Sean Warren   Account Number:  [id number]  Date Initiated:  12/04/2012  Documentation initiated by:  Sharrie Rothman  Subjective/Objective Assessment:   Pt admitted from home with respiratory distress. Pt currently on ventilator. Pt is currently living with a friend who states that pt cannot return home with her at discharge.     Action/Plan:   CSW is consulted. Pt will need PT consult once extubated. Will continue to follow for discharge planning needs.   Anticipated DC Date:  12/09/2012   Anticipated DC Plan:  SKILLED NURSING FACILITY  In-house referral  Clinical Social Worker      DC Planning Services  CM consult      Choice offered to / List presented to:             Status of service:  Completed, signed off Medicare Important Message given?   (If response is "NO", the following Medicare IM given date fields will be blank) Date Medicare IM given:   Date Additional Medicare IM given:    Discharge Disposition:  LONG TERM ACUTE CARE (LTAC)  Per UR Regulation:    If discussed at Long Length of Stay Meetings, dates discussed:   12/04/2012    Comments:   12/05/12 1510 Arlyss Queen, RN BSN CM Pt transfering to [facility] today via [facility]. No other Cm need snoted. 12/04/12 1315 Arlyss Queen, RN BSN CM

## 2012-12-04 NOTE — Progress Notes (Signed)
Inpatient Diabetes Program Recommendations  AACE/ADA: New Consensus Statement on Inpatient Glycemic Control (2013)  Target Ranges:  Prepandial:   less than 140 mg/dL      Peak postprandial:   less than 180 mg/dL (1-2 hours)      Critically ill patients:  140 - 180 mg/dL   Results for JALIEL, DEAVERS (MRN [id number]) as of 12/04/2012 09:27  Ref. Range 12/03/2012 00:49 12/03/2012 05:25 12/03/2012 07:36 12/03/2012 10:53 12/03/2012 13:45 12/03/2012 16:35 12/03/2012 19:52 12/03/2012 23:40 12/04/2012 04:00 12/04/2012 07:37  Glucose-Capillary Latest Range: 70-99 mg/dL 657 (H) 846 (H) 962 (H) 212 (H) 258 (H) 316 (H) 271 (H) 241 (H) 241 (H) 178 (H)   Inpatient Diabetes Program Recommendations Insulin - Basal: Please consider increasing Lantus to 20 units QHS. Insulin - Meal Coverage: May want to consider ordering tube feeding coverage; recommend Novolog 3 units Q4H for tube feeding coverage.    Thanks, Orlando Penner, RN, MSN, CCRN Diabetes Coordinator Inpatient Diabetes Program [phone] (Team Pager) [phone] ([facility] office) [phone] [facility] office)

## 2012-12-04 NOTE — Progress Notes (Signed)
Subjective: He remains intubated on the ventilator. He is still on 50% oxygen.  Objective: Vital signs in last 24 hours: Temp:  [97 F (36.1 C)-98.8 F (37.1 C)] 97.1 F (36.2 C) (09/18 0739) Pulse Rate:  [51-115] 71 (09/18 0745) Resp:  [13-24] 14 (09/18 0745) BP: (102-148)/(55-95) 110/69 mmHg (09/18 0745) SpO2:  [91 %-100 %] 98 % (09/18 0844) FiO2 (%):  [45 %-50 %] 45 % (09/18 0844) Weight:  [120.1 kg (264 lb 12.4 oz)] 120.1 kg (264 lb 12.4 oz) (09/18 0500) Weight change:  Last BM Date: 11/30/12  Intake/Output from previous day: 09/17 0701 - 09/18 0700 In: 1214.1 [I.V.:934.1; NG/GT:130; IV Piggyback:150] Out: 1450 [Urine:1450]  PHYSICAL EXAM General appearance: Intubated and sedated. He is on mechanical ventilation. Resp: rhonchi bilaterally Cardio: regular rate and rhythm, S1, S2 normal, no murmur, click, rub or gallop GI: soft, non-tender; bowel sounds normal; no masses,  no organomegaly Extremities: extremities normal, atraumatic, no cyanosis or edema  Lab Results:    Basic Metabolic Panel:  Recent Labs  16/10/96 0534 12/04/12 0506  NA 147* 143  K 3.1* 3.6  CL 102 101  CO2 36* 38*  GLUCOSE 295* 238*  BUN 33* 28*  CREATININE 1.19 1.07  CALCIUM 8.7 8.9  MG  --  2.5   Liver Function Tests:  Recent Labs  12/04/12 0506  AST 9  ALT <5  ALKPHOS 63  BILITOT 0.3  PROT 5.5*  ALBUMIN 2.6*   No results found for this basename: LIPASE, AMYLASE,  in the last 72 hours No results found for this basename: AMMONIA,  in the last 72 hours CBC:  Recent Labs  12/03/12 0534 12/04/12 0506  WBC 5.2 5.8  HGB 11.7* 11.7*  HCT 36.9* 37.1*  MCV 100.5* 100.3*  PLT 198 173   Cardiac Enzymes: No results found for this basename: CKTOTAL, CKMB, CKMBINDEX, TROPONINI,  in the last 72 hours BNP: No results found for this basename: PROBNP,  in the last 72 hours D-Dimer: No results found for this basename: DDIMER,  in the last 72 hours CBG:  Recent Labs   12/03/12 1345 12/03/12 1635 12/03/12 1952 12/03/12 2340 12/04/12 0400 12/04/12 0737  GLUCAP 258* 316* 271* 241* 241* 178*   Hemoglobin A1C: No results found for this basename: HGBA1C,  in the last 72 hours Fasting Lipid Panel:  Recent Labs  12/04/12 0506  TRIG 269*   Thyroid Function Tests: No results found for this basename: TSH, T4TOTAL, FREET4, T3FREE, THYROIDAB,  in the last 72 hours Anemia Panel: No results found for this basename: VITAMINB12, FOLATE, FERRITIN, TIBC, IRON, RETICCTPCT,  in the last 72 hours Coagulation: No results found for this basename: LABPROT, INR,  in the last 72 hours Urine Drug Screen: Drugs of Abuse     Component Value Date/Time   LABOPIA NONE DETECTED 11/29/2012 1357   LABOPIA NEG 08/26/2007 2017   COCAINSCRNUR NONE DETECTED 11/29/2012 1357   COCAINSCRNUR NEG 08/26/2007 2017   LABBENZ POSITIVE* 11/29/2012 1357   LABBENZ NEG 08/26/2007 2017   AMPHETMU NONE DETECTED 11/29/2012 1357   AMPHETMU NEG 08/26/2007 2017   THCU NONE DETECTED 11/29/2012 1357   LABBARB POSITIVE* 11/29/2012 1357    Alcohol Level: No results found for this basename: ETH,  in the last 72 hours Urinalysis: No results found for this basename: COLORURINE, APPERANCEUR, LABSPEC, PHURINE, GLUCOSEU, HGBUR, BILIRUBINUR, KETONESUR, PROTEINUR, UROBILINOGEN, NITRITE, LEUKOCYTESUR,  in the last 72 hours Misc. Labs:  ABGS  Recent Labs  12/04/12 0751  PHART 7.447  PO2ART 69.4*  TCO2 33.2  HCO3 36.6*   CULTURES Recent Results (from the past 240 hour(s))  URINE CULTURE     Status: None   Collection Time    11/29/12 12:37 PM      Result Value Range Status   Specimen Description URINE, CATHETERIZED   Final   Special Requests NONE   Final   Culture  Setup Time     Final   Value: 11/29/2012 21:50     Performed at [facility] Count     Final   Value: NO GROWTH     Performed at [facility]   Culture     Final   Value: NO GROWTH     Performed at [facility]   Report Status 11/30/2012 FINAL   Final  CULTURE, RESPIRATORY (NON-EXPECTORATED)     Status: None   Collection Time    11/29/12  1:05 PM      Result Value Range Status   Specimen Description TRACHEAL ASPIRATE   Final   Special Requests NONE   Final   Gram Stain     Final   Value: MODERATE WBC PRESENT, PREDOMINANTLY PMN     RARE SQUAMOUS EPITHELIAL CELLS PRESENT     NO ORGANISMS SEEN     Performed at [facility]   Culture     Final   Value: FEW MORAXELLA CATARRHALIS(BRANHAMELLA)     Note: BETA LACTAMASE POSITIVE     Performed at [facility]   Report Status 12/02/2012 FINAL   Final   Studies/Results: Dg Chest Port 1 View  12/03/2012   CLINICAL DATA:  PICC placement  EXAM: PORTABLE CHEST - 1 VIEW  COMPARISON:  12/02/2012  FINDINGS: Left arm PICC is difficult to see due to underpenetration. Tip appears to be in the SVC at the cavoatrial junction.  Endotracheal tube in good position. NG tube is in place with tip not visualized.  Increase in bibasilar atelectasis and vascular congestion.  IMPRESSION: PICC tip in the region of the SVC.  Increase in vascular congestion and bibasilar atelectasis.   Electronically Signed   By: Marlan Palau M.D.   On: 12/03/2012 15:03    Medications:  Scheduled: . ipratropium  0.5 mg Nebulization Q4H   And  . albuterol  2.5 mg Nebulization Q4H  . antiseptic oral rinse  15 mL Mouth Rinse q12n4p  . antiseptic oral rinse  15 mL Mouth Rinse QID  . chlorhexidine  15 mL Mouth Rinse BID  . diltiazem  15 mg Intravenous Q6H  . feeding supplement (OXEPA)  1,000 mL Per Tube Q24H  . furosemide  20 mg Oral BID  . gabapentin  600 mg Oral Q8H  . insulin aspart  0-20 Units Subcutaneous Q4H  . insulin glargine  17 Units Subcutaneous QHS  . levofloxacin (LEVAQUIN) IV  750 mg Intravenous Q24H  . methylPREDNISolone (SOLU-MEDROL) injection  60 mg Intravenous Q24H  . pantoprazole (PROTONIX) IV  40 mg Intravenous Q24H  . potassium chloride  20  mEq Oral BID  . sodium chloride  10-40 mL Intracatheter Q12H  . sodium chloride  3 mL Intravenous Q12H   Continuous: . dextrose 5 % with kcl 70 mL/hr at 12/04/12 0731  . fentaNYL infusion INTRAVENOUS 100 mcg/hr (12/03/12 1359)  . heparin 1,350 Units/hr (12/03/12 1355)  . propofol 10 mcg/kg/min (12/04/12 0840)   ZOX:WRUEAV chloride, acetaminophen, acetaminophen, fentaNYL, LORazepam, sodium chloride, sodium chloride  Assesment: He has acute  on chronic respiratory failure requiring ventilator support. He was able to be extubated briefly earlier this week but now is on 50% oxygen so we don't have any opportunity to try to get him off yet. He has had bouts of atrial fibrillation but appears to be in sinus rhythm now. He has obesity and has heart failure. He also has obstructive sleep apnea. He's had a stroke in the past. He has diabetes which is being managed by the primary team Principal Problem:   COPD exacerbation Active Problems:   Obstructive sleep apnea   CVA (cerebral infarction)   DM (diabetes mellitus)   HTN (hypertension)   Hypokalemia   Obesity (BMI 30.0-34.9)   Paroxysmal a-fib   Chronic respiratory failure with hypoxia   Chronic diastolic heart failure   Acute-on-chronic respiratory failure   Hypothermia   Hypernatremia    Plan: See if he can be reduced as far as his FiO2 is concerned. There's not much else to do. Chest x-ray and blood gas are pending this morning    LOS: 5 days   Yug Loria L 12/04/2012, 8:44 AM

## 2012-12-04 NOTE — Progress Notes (Signed)
UR chart review completed.  

## 2012-12-04 NOTE — Progress Notes (Signed)
[facility] PROGRESS NOTE  Sean Warren ZOX:[id number] DOB: 22-Aug-1955 DOA: 11/29/2012 PCP: [name], MD    Code Status: Full code. Family Communication: family not available. Disposition Plan: to be determined.   Consultants:  Pulmonologist, Dr. [name].  Procedures:  Intubation and mechanical ventilation 11/29/2012, then extubated, then re\re intubated.  Right upper extremity PICC 12/03/2012  Antibiotics:  IV Levaquin>>> 12/04/12  Rocephin 12/04/12>>>  Zithromax 12/04/12>>>  HPI/Subjective: The patient is intubated. He is sedated. Nursing reports that when the sedation is turned down, he responds appropriately.  Objective: Filed Vitals:   12/04/12 0945  BP: 127/79  Pulse:   Temp:   Resp: 14   Blood pressure 127/79 pulse 65 respiratory rate 14 oxygen saturation 92%   Intake/Output Summary (Last 24 hours) at 12/04/12 1308 Last data filed at 12/04/12 0856  Gross per 24 hour  Intake 795.67 ml  Output   1450 ml  Net -654.33 ml   Filed Weights   11/30/12 0500 12/01/12 0500 12/04/12 0500  Weight: 121.1 kg (266 lb 15.6 oz) 122.1 kg (269 lb 2.9 oz) 120.1 kg (264 lb 12.4 oz)    Exam:   General:  Obese large framed 57 year old Caucasian man sedated on the ventilator.  Cardiovascular: irregular, irregular.  Respiratory: few scattered crackles auscultated anteriorly.  Abdomen: obese, positive bowel sounds, nontender, nondistended.  Musculoskeletal: no acute hot red joints.  Neurologic: He is sedated on propofol, but responds to stimuli. He moves his extremities spontaneously with provocation. Cranial nerves II through XII are grossly intact.  Data Reviewed: Basic Metabolic Panel:  Recent Labs Lab 11/29/12 1100 11/30/12 0529 12/01/12 0428 12/03/12 0534 12/04/12 0506  NA 142 144 146* 147* 143  K 4.6 3.8 3.3* 3.1* 3.6  CL 97 99 101 102 101  CO2 38* 34* 35* 36* 38*  GLUCOSE 212* 207* 242* 295* 238*  BUN 21 28* 34* 33* 28*  CREATININE 1.08  1.28 1.39* 1.19 1.07  CALCIUM 9.5 9.1 9.1 8.7 8.9  MG  --   --   --   --  2.5   Liver Function Tests:  Recent Labs Lab 11/29/12 1100 12/04/12 0506  AST 10 9  ALT 13 <5  ALKPHOS 120* 63  BILITOT 0.3 0.3  PROT 7.5 5.5*  ALBUMIN 3.3* 2.6*   No results found for this basename: LIPASE, AMYLASE,  in the last 168 hours  Recent Labs Lab 11/29/12 1101  AMMONIA 20   CBC:  Recent Labs Lab 11/29/12 1100 12/01/12 0428 12/03/12 0534 12/04/12 0506  WBC 8.8 7.1 5.2 5.8  NEUTROABS 7.0  --   --   --   HGB 13.7 11.2* 11.7* 11.7*  HCT 44.1 35.7* 36.9* 37.1*  MCV 102.8* 99.4 100.5* 100.3*  PLT 197 223 198 173   Cardiac Enzymes: No results found for this basename: CKTOTAL, CKMB, CKMBINDEX, TROPONINI,  in the last 168 hours BNP (last 3 results)  Recent Labs  10/31/12 1355 11/17/12 2032 11/27/12 0924  PROBNP 1622.0* 1300.0* 2577.0*   CBG:  Recent Labs Lab 12/03/12 1952 12/03/12 2340 12/04/12 0400 12/04/12 0737 12/04/12 1155  GLUCAP 271* 241* 241* 178* 194*    Recent Results (from the past 240 hour(s))  URINE CULTURE     Status: None   Collection Time    11/29/12 12:37 PM      Result Value Range Status   Specimen Description URINE, CATHETERIZED   Final   Special Requests NONE   Final   Culture  Setup Time  Final   Value: 11/29/2012 21:50     Performed at [facility] Count     Final   Value: NO GROWTH     Performed at [facility]   Culture     Final   Value: NO GROWTH     Performed at [facility]   Report Status 11/30/2012 FINAL   Final  CULTURE, RESPIRATORY (NON-EXPECTORATED)     Status: None   Collection Time    11/29/12  1:05 PM      Result Value Range Status   Specimen Description TRACHEAL ASPIRATE   Final   Special Requests NONE   Final   Gram Stain     Final   Value: MODERATE WBC PRESENT, PREDOMINANTLY PMN     RARE SQUAMOUS EPITHELIAL CELLS PRESENT     NO ORGANISMS SEEN     Performed at [facility]    Culture     Final   Value: FEW MORAXELLA CATARRHALIS(BRANHAMELLA)     Note: BETA LACTAMASE POSITIVE     Performed at [facility]   Report Status 12/02/2012 FINAL   Final     Studies: Dg Chest Port 1 View  12/04/2012   CLINICAL DATA:  Respiratory failure  EXAM: PORTABLE CHEST - 1 VIEW  COMPARISON:  12/03/2012  FINDINGS: Cardiomegaly again noted. Study is limited by patient's large body habitus. Stable endotracheal and NG tube position. Again noted central vascular congestion and hazy bilateral basilar atelectasis or infiltrate. No convincing pulmonary edema.  IMPRESSION: Stable support apparatus. Again noted central vascular congestion and hazy bilateral basilar atelectasis or infiltrate. No pulmonary edema.   Electronically Signed   By: Natasha Mead   On: 12/04/2012 09:46   Dg Chest Port 1 View  12/03/2012   CLINICAL DATA:  PICC placement  EXAM: PORTABLE CHEST - 1 VIEW  COMPARISON:  12/02/2012  FINDINGS: Left arm PICC is difficult to see due to underpenetration. Tip appears to be in the SVC at the cavoatrial junction.  Endotracheal tube in good position. NG tube is in place with tip not visualized.  Increase in bibasilar atelectasis and vascular congestion.  IMPRESSION: PICC tip in the region of the SVC.  Increase in vascular congestion and bibasilar atelectasis.   Electronically Signed   By: Marlan Palau M.D.   On: 12/03/2012 15:03    Scheduled Meds: . ipratropium  0.5 mg Nebulization Q4H   And  . albuterol  2.5 mg Nebulization Q4H  . antiseptic oral rinse  15 mL Mouth Rinse q12n4p  . antiseptic oral rinse  15 mL Mouth Rinse QID  . chlorhexidine  15 mL Mouth Rinse BID  . diltiazem  15 mg Intravenous Q6H  . feeding supplement (OXEPA)  1,000 mL Per Tube Q24H  . furosemide  20 mg Oral BID  . gabapentin  600 mg Oral Q8H  . insulin aspart  0-20 Units Subcutaneous Q4H  . insulin glargine  17 Units Subcutaneous QHS  . levofloxacin (LEVAQUIN) IV  750 mg Intravenous Q24H  .  methylPREDNISolone (SOLU-MEDROL) injection  60 mg Intravenous Q24H  . pantoprazole (PROTONIX) IV  40 mg Intravenous Q24H  . potassium chloride  20 mEq Oral BID  . sodium chloride  10-40 mL Intracatheter Q12H  . sodium chloride  3 mL Intravenous Q12H   Continuous Infusions: . dextrose 5 % with kcl 70 mL/hr at 12/04/12 0731  . fentaNYL infusion INTRAVENOUS 100 mcg/hr (12/04/12 1140)  . heparin 1,350 Units/hr (  12/03/12 1355)  . propofol 40.95 mcg/kg/min (12/04/12 1139)    Assessment:  Principal Problem:   COPD exacerbation Active Problems:   Acute-on-chronic respiratory failure   Hypokalemia   Paroxysmal a-fib   Obstructive sleep apnea   CVA (cerebral infarction)   DM (diabetes mellitus)   HTN (hypertension)   Obesity (BMI 30.0-34.9)   Chronic respiratory failure with hypoxia   Chronic diastolic heart failure   Hypothermia   Hypernatremia  1. COPD with exacerbation. We'll continue steroids, Levaquin and bronchodilators. Pharmacy noted potential worsening prolonged QTC with Levaquin-quinapril combination. Apparently quinapril has been discontinued.  Acute ventilator respiratory failure superimposed on chronic respiratory failure. ABG with with overall slight improvement in PCO2 and PO2. PO2. Appreciate Dr. [name] input. Adjustments in ventilator settings per Dr. [name].  Obstructive sleep apnea. Treatment as above.   Paroxysmal atrial fibrillation. Currently rate controlled on every 6 hour IV diltiazem.  On chronic Coumadin, being held. On IV heparin instead.  Hypertension. Currently controlled. On diltiazem and propanolol chronically. Propanolol on hold, but was ordered via enteric route.  Chronic diastolic heart failure. IV Lasix discontinued due to hypernatremia. Oral Lasix started at 20 mg twice a day on 12/03/2012. Chest x-ray reveals no pulmonary edema.  Hypernatremia. Likely secondary to hypovolemia. Resolved with decrease in Lasix and start her free  water.  Hypokalemia. Repleting in the IV fluids. Magnesium level within normal limits.  Diabetes mellitus. Capillary glucose elevated in the setting of IV steroids. Will adjust insulin accordingly.  Nutrition: On physical tube feedings.  DVT prophylaxis. On full dose IV heparin  GI prophylaxis: On protonix.   Plan: 1. Increase insulin with dextrose and IV fluids and Solu-Medrol. 2. Supportive treatment. 3. Recommendations per Dr. [name] regarding ventilator settings.     Time spent: Critical care time: 40 minutes.    [facility]  [facility] Pager [phone] If 7PM-7AM, please contact night-coverage at [email or web address], password [facility] 12/04/2012, 1:08 PM  LOS: 5 days

## 2012-12-05 ENCOUNTER — Inpatient Hospital Stay (HOSPITAL_COMMUNITY): Payer: Medicare Other

## 2012-12-05 LAB — BASIC METABOLIC PANEL
BUN: 29 mg/dL — ABNORMAL HIGH (ref 6–23)
CO2: 36 mEq/L — ABNORMAL HIGH (ref 19–32)
Calcium: 8.6 mg/dL (ref 8.4–10.5)
Creatinine, Ser: 1.13 mg/dL (ref 0.50–1.35)
GFR calc non Af Amer: 69 mL/min — ABNORMAL LOW (ref >90)
Glucose, Bld: 229 mg/dL — ABNORMAL HIGH (ref 70–99)
Sodium: 138 mEq/L (ref 135–145)

## 2012-12-05 LAB — HEPARIN LEVEL (UNFRACTIONATED): Heparin Unfractionated: 0.22 IU/mL — ABNORMAL LOW (ref 0.30–0.70)

## 2012-12-05 LAB — BLOOD GAS, ARTERIAL
Bicarbonate: 33.3 mEq/L — ABNORMAL HIGH (ref 20.0–24.0)
Drawn by: 27407
O2 Saturation: 88.9 %
PEEP: 5 cmH2O
Pressure support: 5 cmH2O
pH, Arterial: 7.298 — ABNORMAL LOW (ref 7.350–7.450)
pO2, Arterial: 67.5 mmHg — ABNORMAL LOW (ref 80.0–100.0)

## 2012-12-05 LAB — GLUCOSE, CAPILLARY
Glucose-Capillary: 216 mg/dL — ABNORMAL HIGH (ref 70–99)
Glucose-Capillary: 209 mg/dL — ABNORMAL HIGH (ref 70–99)
Glucose-Capillary: 298 mg/dL — ABNORMAL HIGH (ref 70–99)

## 2012-12-05 LAB — CBC
HCT: 35.4 % — ABNORMAL LOW (ref 39.0–52.0)
Hemoglobin: 11.1 g/dL — ABNORMAL LOW (ref 13.0–17.0)
MCH: 31.5 pg (ref 26.0–34.0)
MCHC: 31.4 g/dL (ref 30.0–36.0)
MCV: 100.6 fL — ABNORMAL HIGH (ref 78.0–100.0)

## 2012-12-05 MED ORDER — CHLORHEXIDINE GLUCONATE 0.12 % MT SOLN: 15.0000 mL | Freq: Two times a day (BID) | OROMUCOSAL | Status: DC

## 2012-12-05 MED ORDER — BISACODYL 10 MG RE SUPP
10.0000 mg | Freq: Every day | RECTAL | Status: DC
  Administered 2012-12-05: 10 mg via RECTAL
  Filled 2012-12-05: qty 1

## 2012-12-05 MED ORDER — IPRATROPIUM BROMIDE 0.02 % IN SOLN: 0.5000 mg | RESPIRATORY_TRACT | Status: DC

## 2012-12-05 MED ORDER — FUROSEMIDE 20 MG PO TABS: 20.0000 mg | ORAL_TABLET | Freq: Two times a day (BID) | ORAL | Status: DC

## 2012-12-05 MED ORDER — POTASSIUM CL IN DEXTROSE 5% 20 MEQ/L IV SOLN: 20.0000 meq | INTRAVENOUS | Status: DC

## 2012-12-05 MED ORDER — PANTOPRAZOLE SODIUM 40 MG IV SOLR: 40.0000 mg | INTRAVENOUS | Status: DC

## 2012-12-05 MED ORDER — LEVOFLOXACIN IN D5W 750 MG/150ML IV SOLN: 750.0000 mg | INTRAVENOUS | Status: DC

## 2012-12-05 MED ORDER — LORAZEPAM 2 MG/ML IJ SOLN: 1.0000 mg | INTRAMUSCULAR | Status: DC | PRN

## 2012-12-05 MED ORDER — POTASSIUM CL IN DEXTROSE 5% 20 MEQ/L IV SOLN
20.0000 meq | INTRAVENOUS | Status: DC
  Administered 2012-12-05: 20 meq via INTRAVENOUS

## 2012-12-05 MED ORDER — HEPARIN (PORCINE) IN NACL 100-0.45 UNIT/ML-% IJ SOLN: 1450.0000 [IU]/h | INTRAMUSCULAR | Status: DC

## 2012-12-05 MED ORDER — DILTIAZEM HCL 25 MG/5ML IV SOLN: 15.0000 mg | Freq: Four times a day (QID) | INTRAVENOUS | Status: DC

## 2012-12-05 MED ORDER — INSULIN ASPART 100 UNIT/ML ~~LOC~~ SOLN: 0.0000 [IU] | SUBCUTANEOUS | Status: DC

## 2012-12-05 MED ORDER — FENTANYL BOLUS VIA INFUSION: 25.0000 ug | Freq: Four times a day (QID) | INTRAVENOUS | Status: DC | PRN

## 2012-12-05 MED ORDER — PROPOFOL 10 MG/ML IV EMUL: 5.0000 ug/kg/min | INTRAVENOUS | Status: DC

## 2012-12-05 MED ORDER — ALBUTEROL SULFATE (5 MG/ML) 0.5% IN NEBU: 2.5000 mg | INHALATION_SOLUTION | RESPIRATORY_TRACT | Status: DC

## 2012-12-05 MED ORDER — INSULIN ASPART 100 UNIT/ML ~~LOC~~ SOLN: 3.0000 [IU] | SUBCUTANEOUS | Status: DC

## 2012-12-05 MED ORDER — INSULIN GLARGINE 100 UNIT/ML ~~LOC~~ SOLN: 25.0000 [IU] | Freq: Every day | SUBCUTANEOUS | Status: DC

## 2012-12-05 MED ORDER — BIOTENE DRY MOUTH MT LIQD: 15.0000 mL | Freq: Two times a day (BID) | OROMUCOSAL | Status: DC

## 2012-12-05 MED ORDER — BISACODYL 10 MG RE SUPP: 10.0000 mg | Freq: Every day | RECTAL | Status: DC

## 2012-12-05 MED ORDER — INSULIN ASPART 100 UNIT/ML ~~LOC~~ SOLN
3.0000 [IU] | SUBCUTANEOUS | Status: DC
  Administered 2012-12-05 (×2): 3 [IU] via SUBCUTANEOUS

## 2012-12-05 MED ORDER — OXEPA PO LIQD: 1000.0000 mL | ORAL | Status: DC

## 2012-12-05 MED ORDER — METHYLPREDNISOLONE SODIUM SUCC 125 MG IJ SOLR: 60.0000 mg | INTRAMUSCULAR | Status: DC

## 2012-12-05 MED ORDER — SODIUM CHLORIDE 0.9 % IV SOLN: 25.0000 ug/h | INTRAVENOUS | Status: DC

## 2012-12-05 NOTE — Progress Notes (Signed)
DR HAWKINS NOTIFIED OF ABG RESULTS.  WEANING STOPPED AND PT NOW BACK ON FULL VENTILATOR SUPPORT.

## 2012-12-05 NOTE — Progress Notes (Signed)
ANTICOAGULATION CONSULT NOTE   Pharmacy Consult for Heparin Indication: atrial fibrillation  Allergies  Allergen Reactions  . Flexeril [Cyclobenzaprine] Other (See Comments)    Alters Mental Status-ALL MUSCLE RELAXERS  . Muscle Relief [Capsaicin]     Hysteria    Patient Measurements: Height: 5\' 8"  (172.7 cm) Weight: 273 lb 13 oz (124.2 kg) IBW/kg (Calculated) : 68.4 Heparin Dosing Weight: 89Kg  Vital Signs: Temp: 99 F (37.2 C) (09/19 0400) Temp src: Core (Comment) (09/19 0400) BP: 94/56 mmHg (09/19 0515) Pulse Rate: 67 (09/19 0645)  Labs:  Recent Labs  12/03/12 0534 12/04/12 0506 12/05/12 0445  HGB 11.7* 11.7* 11.1*  HCT 36.9* 37.1* 35.4*  PLT 198 173 154  HEPARINUNFRC 0.42 0.36 0.22*  CREATININE 1.19 1.07 1.13   Estimated Creatinine Clearance: 90.3 ml/min (by C-G formula based on Cr of 1.13).  Medical History: Past Medical History  Diagnosis Date  . Hypertension   . Diabetes mellitus   . Bipolar 1 disorder   . Hyperlipidemia   . Atrial fibrillation   . COPD (chronic obstructive pulmonary disease)   . Obstructive sleep apnea   . Chronic diastolic heart failure     Echocardiogram 05/14/11: Mild LVH, EF 50-55%, no wall motion abnormalities, grade 2 diastolic dysfunction.  . Chest pain     Myoview 05/17/11 demonstrated an EF of 61%, small fixed inferoseptal defect consistent with thinning vs prior infarct; no ischemia. - low risk  . Diabetic neuropathy   . Chronic back pain   . History of stroke   . Mental disorder     bipolar  . CHF (congestive heart failure)   . Stroke   . Neuromuscular disorder     diabetic neuropathy  . Chronic respiratory failure with hypoxia 08/08/2012  . Noncompliance with medication regimen   . Tobacco abuse    Medications:  Scheduled:  . ipratropium  0.5 mg Nebulization Q4H   And  . albuterol  2.5 mg Nebulization Q4H  . antiseptic oral rinse  15 mL Mouth Rinse q12n4p  . antiseptic oral rinse  15 mL Mouth Rinse QID  .  chlorhexidine  15 mL Mouth Rinse BID  . diltiazem  15 mg Intravenous Q6H  . feeding supplement (OXEPA)  1,000 mL Per Tube Q24H  . furosemide  20 mg Oral BID  . gabapentin  600 mg Oral Q8H  . insulin aspart  0-20 Units Subcutaneous Q4H  . insulin glargine  25 Units Subcutaneous QHS  . levofloxacin (LEVAQUIN) IV  750 mg Intravenous Q24H  . methylPREDNISolone (SOLU-MEDROL) injection  60 mg Intravenous Q24H  . pantoprazole (PROTONIX) IV  40 mg Intravenous Q24H  . potassium chloride  20 mEq Oral BID  . sodium chloride  10-40 mL Intracatheter Q12H  . sodium chloride  3 mL Intravenous Q12H   Assessment: 57yo male with h/o afib on chronic warfarin.  INR at baseline on admission.  Patient admitted with respiratory failure currently vent-dependent.  Heparin bridge for afib while patient is NPO.   Heparin level is below therapeutic goal today. No bleeding noted.   Goal of Therapy:  Heparin level 0.3-0.7 units/ml Monitor platelets by anticoagulation protocol: Yes   Plan:  Increase Heparin at 1450 units/hr Heparin level & CBC daily while on heparin.  Elson Clan 12/05/2012,8:16 AM

## 2012-12-05 NOTE — Progress Notes (Signed)
Inpatient Diabetes Program Recommendations  AACE/ADA: New Consensus Statement on Inpatient Glycemic Control (2013)  Target Ranges:  Prepandial:   less than 140 mg/dL      Peak postprandial:   less than 180 mg/dL (1-2 hours)      Critically ill patients:  140 - 180 mg/dL    Results for DREUX, MCGROARTY (MRN [id number]) as of 12/05/2012 10:06  Ref. Range 12/03/2012 23:40 12/04/2012 04:00 12/04/2012 07:37 12/04/2012 11:55 12/04/2012 15:42 12/04/2012 19:59  Glucose-Capillary Latest Range: 70-99 mg/dL 409 (H) 811 (H) 914 (H) 194 (H) 310 (H) 333 (H)    Results for NEILAN, RIZZO (MRN [id number]) as of 12/05/2012 10:06  Ref. Range 12/04/2012 23:57 12/05/2012 04:41 12/05/2012 07:34  Glucose-Capillary Latest Range: 70-99 mg/dL 086 (H) 578 (H) 469 (H)    **Noted Lantus increased to 25 units QHS last night  **MD- Please consider adding tube feed coverage to help better control CBGs- Novolog 3 units Q4 hours (hold if tube feeds held)   Will follow. Ambrose Finland RN, MSN, CDE Diabetes Coordinator Inpatient Diabetes Program Team Pager: [phone] (8a-10p)

## 2012-12-05 NOTE — Clinical Social Work Note (Signed)
CSW spoke w sister, [name], in [place] at phone number listed on facesheet.  Sister states that she is next of kin for patient.  Patient does have two daughters in [place] but they are minor children (Tiffany - 17, April - 15).    Santa Genera, LCSW Clinical Social Worker [phone])

## 2012-12-05 NOTE — Progress Notes (Signed)
Subjective: He looks a little better this morning. He remains intubated on the ventilator but his oxygenation has improved  Objective: Vital signs in last 24 hours: Temp:  [98.2 F (36.8 C)-99 F (37.2 C)] 99 F (37.2 C) (09/19 0400) Pulse Rate:  [43-140] 67 (09/19 0645) Resp:  [14-38] 14 (09/19 0645) BP: (88-144)/(42-89) 94/56 mmHg (09/19 0515) SpO2:  [91 %-100 %] 94 % (09/19 0645) FiO2 (%):  [45 %-50 %] 45 % (09/19 0348) Weight:  [124.2 kg (273 lb 13 oz)] 124.2 kg (273 lb 13 oz) (09/19 0500) Weight change: 4.1 kg (9 lb 0.6 oz) Last BM Date: 11/30/12  Intake/Output from previous day: 09/18 0701 - 09/19 0700 In: 6109.6 [I.V.:4669.6; NG/GT:1290; IV Piggyback:150] Out: 2200 [Urine:2200]  PHYSICAL EXAM General appearance: Intubated, sedated and on mechanical ventilation Resp: rhonchi bilaterally Cardio: regular rate and rhythm, S1, S2 normal, no murmur, click, rub or gallop GI: soft, non-tender; bowel sounds normal; no masses,  no organomegaly Extremities: extremities normal, atraumatic, no cyanosis or edema  Lab Results:    Basic Metabolic Panel:  Recent Labs  16/10/96 0506 12/05/12 0445  NA 143 138  K 3.6 4.8  CL 101 102  CO2 38* 36*  GLUCOSE 238* 229*  BUN 28* 29*  CREATININE 1.07 1.13  CALCIUM 8.9 8.6  MG 2.5  --    Liver Function Tests:  Recent Labs  12/04/12 0506  AST 9  ALT <5  ALKPHOS 63  BILITOT 0.3  PROT 5.5*  ALBUMIN 2.6*   No results found for this basename: LIPASE, AMYLASE,  in the last 72 hours No results found for this basename: AMMONIA,  in the last 72 hours CBC:  Recent Labs  12/04/12 0506 12/05/12 0445  WBC 5.8 7.4  HGB 11.7* 11.1*  HCT 37.1* 35.4*  MCV 100.3* 100.6*  PLT 173 154   Cardiac Enzymes: No results found for this basename: CKTOTAL, CKMB, CKMBINDEX, TROPONINI,  in the last 72 hours BNP: No results found for this basename: PROBNP,  in the last 72 hours D-Dimer: No results found for this basename: DDIMER,  in the  last 72 hours CBG:  Recent Labs  12/04/12 1155 12/04/12 1542 12/04/12 1959 12/04/12 2357 12/05/12 0441 12/05/12 0734  GLUCAP 194* 310* 333* 246* 216* 209*   Hemoglobin A1C:  Recent Labs  12/04/12 0506  HGBA1C 8.0*   Fasting Lipid Panel:  Recent Labs  12/04/12 0506  TRIG 269*   Thyroid Function Tests: No results found for this basename: TSH, T4TOTAL, FREET4, T3FREE, THYROIDAB,  in the last 72 hours Anemia Panel: No results found for this basename: VITAMINB12, FOLATE, FERRITIN, TIBC, IRON, RETICCTPCT,  in the last 72 hours Coagulation: No results found for this basename: LABPROT, INR,  in the last 72 hours Urine Drug Screen: Drugs of Abuse     Component Value Date/Time   LABOPIA NONE DETECTED 11/29/2012 1357   LABOPIA NEG 08/26/2007 2017   COCAINSCRNUR NONE DETECTED 11/29/2012 1357   COCAINSCRNUR NEG 08/26/2007 2017   LABBENZ POSITIVE* 11/29/2012 1357   LABBENZ NEG 08/26/2007 2017   AMPHETMU NONE DETECTED 11/29/2012 1357   AMPHETMU NEG 08/26/2007 2017   THCU NONE DETECTED 11/29/2012 1357   LABBARB POSITIVE* 11/29/2012 1357    Alcohol Level: No results found for this basename: ETH,  in the last 72 hours Urinalysis: No results found for this basename: COLORURINE, APPERANCEUR, LABSPEC, PHURINE, GLUCOSEU, HGBUR, BILIRUBINUR, KETONESUR, PROTEINUR, UROBILINOGEN, NITRITE, LEUKOCYTESUR,  in the last 72 hours Misc. Labs:  ABGS  Recent  Labs  12/05/12 0500  PHART 7.427  PO2ART 69.9*  TCO2 30.5  HCO3 33.5*   CULTURES Recent Results (from the past 240 hour(s))  URINE CULTURE     Status: None   Collection Time    11/29/12 12:37 PM      Result Value Range Status   Specimen Description URINE, CATHETERIZED   Final   Special Requests NONE   Final   Culture  Setup Time     Final   Value: 11/29/2012 21:50     Performed at [facility] Count     Final   Value: NO GROWTH     Performed at [facility]   Culture     Final   Value: NO GROWTH      Performed at [facility]   Report Status 11/30/2012 FINAL   Final  CULTURE, RESPIRATORY (NON-EXPECTORATED)     Status: None   Collection Time    11/29/12  1:05 PM      Result Value Range Status   Specimen Description TRACHEAL ASPIRATE   Final   Special Requests NONE   Final   Gram Stain     Final   Value: MODERATE WBC PRESENT, PREDOMINANTLY PMN     RARE SQUAMOUS EPITHELIAL CELLS PRESENT     NO ORGANISMS SEEN     Performed at [facility]   Culture     Final   Value: FEW MORAXELLA CATARRHALIS(BRANHAMELLA)     Note: BETA LACTAMASE POSITIVE     Performed at [facility]   Report Status 12/02/2012 FINAL   Final   Studies/Results: Dg Chest Port 1 View  12/04/2012   CLINICAL DATA:  Respiratory failure  EXAM: PORTABLE CHEST - 1 VIEW  COMPARISON:  12/03/2012  FINDINGS: Cardiomegaly again noted. Study is limited by patient's large body habitus. Stable endotracheal and NG tube position. Again noted central vascular congestion and hazy bilateral basilar atelectasis or infiltrate. No convincing pulmonary edema.  IMPRESSION: Stable support apparatus. Again noted central vascular congestion and hazy bilateral basilar atelectasis or infiltrate. No pulmonary edema.   Electronically Signed   By: Natasha Mead   On: 12/04/2012 09:46   Dg Chest Port 1 View  12/03/2012   CLINICAL DATA:  PICC placement  EXAM: PORTABLE CHEST - 1 VIEW  COMPARISON:  12/02/2012  FINDINGS: Left arm PICC is difficult to see due to underpenetration. Tip appears to be in the SVC at the cavoatrial junction.  Endotracheal tube in good position. NG tube is in place with tip not visualized.  Increase in bibasilar atelectasis and vascular congestion.  IMPRESSION: PICC tip in the region of the SVC.  Increase in vascular congestion and bibasilar atelectasis.   Electronically Signed   By: Marlan Palau M.D.   On: 12/03/2012 15:03    Medications:  Scheduled: . ipratropium  0.5 mg Nebulization Q4H   And  . albuterol   2.5 mg Nebulization Q4H  . antiseptic oral rinse  15 mL Mouth Rinse q12n4p  . antiseptic oral rinse  15 mL Mouth Rinse QID  . chlorhexidine  15 mL Mouth Rinse BID  . diltiazem  15 mg Intravenous Q6H  . feeding supplement (OXEPA)  1,000 mL Per Tube Q24H  . furosemide  20 mg Oral BID  . gabapentin  600 mg Oral Q8H  . insulin aspart  0-20 Units Subcutaneous Q4H  . insulin glargine  25 Units Subcutaneous QHS  . levofloxacin (LEVAQUIN) IV  750 mg Intravenous Q24H  . methylPREDNISolone (SOLU-MEDROL) injection  60 mg Intravenous Q24H  . pantoprazole (PROTONIX) IV  40 mg Intravenous Q24H  . potassium chloride  20 mEq Oral BID  . sodium chloride  10-40 mL Intracatheter Q12H  . sodium chloride  3 mL Intravenous Q12H   Continuous: . dextrose 5 % with kcl 70 mL/hr at 12/04/12 2248  . fentaNYL infusion INTRAVENOUS 150 mcg/hr (12/05/12 0732)  . heparin 1,350 Units/hr (12/05/12 0600)  . propofol 50 mcg/kg/min (12/05/12 0647)   EAV:[id number] chloride, acetaminophen, acetaminophen, fentaNYL, LORazepam, sodium chloride, sodium chloride  Assesment: He has acute on chronic respiratory failure requiring mechanical ventilation. He was extubated earlier this week but only stayed off the ventilator for a few hours. He's had problems with oxygenation but that seems to be getting better. He has multiple other problems as listed. Principal Problem:   COPD exacerbation Active Problems:   Obstructive sleep apnea   CVA (cerebral infarction)   DM (diabetes mellitus)   HTN (hypertension)   Hypokalemia   Obesity (BMI 30.0-34.9)   Paroxysmal a-fib   Chronic respiratory failure with hypoxia   Chronic diastolic heart failure   Acute-on-chronic respiratory failure   Hypothermia   Hypernatremia    Plan: Attempt weaning today    LOS: 6 days   Abrielle Finck L 12/05/2012, 8:01 AM

## 2012-12-05 NOTE — Discharge Summary (Signed)
Physician Discharge Summary  Sean Warren WJX:[id number] DOB: Aug 08, 1955 DOA: 11/29/2012  PCP: Sean Blood, MD  Admit date: 11/29/2012 Discharge date: 12/05/2012  Time spent: Greater than 30 minutes  Recommendations for Outpatient Follow-up:  1. The patient is being transferred to [facility] for prolonged ventilator management and ongoing treatment of respiratory failure. 2. The patient's sister should be kept informed frequently. Her name is Sean Warren. Phone #[phone].  Discharge Diagnoses:  1. Acute on chronic respiratory failure. Mechanically intubated and ventilated. 2. Chronic hypoxic respiratory failure with hypoxemia secondary to COPD and obstructive sleep apnea. 3. Chronic diastolic heart failure. He has grade 2 diastolic dysfunction per 2-D echocardiogram in 04/2011. Recent hospitalization for acute on chronic diastolic heart failure approximately one week prior to readmission. 4. COPD exacerbation. 5. Obstructive sleep apnea, on CPAP. 6. Paroxysmal atrial fibrillation. 7. Morbid obesity. 8. Type 2 diabetes mellitus. 9. Hypothermia, resolved. 10. Hypernatremia, resolved with free water. 11. Chronic anxiety. 12. Hypertension.  Discharge Condition: Stable but with persistent respiratory failure on the ventilator dependent.  Diet recommendation: Tube feeding.  Filed Weights   12/01/12 0500 12/04/12 0500 12/05/12 0500  Weight: 122.1 kg (269 lb 2.9 oz) 120.1 kg (264 lb 12.4 oz) 124.2 kg (273 lb 13 oz)    History of present illness:  The patient is a 57 year old man with a history of paroxysmal atrial fibrillation, diabetes mellitus, COPD, chronic diastolic heart failure, and obstructive sleep apnea, who was recently discharged from the hospital on 11/19/2012 for treatment of acute on chronic diastolic heart failure. He presented again to the emergency department on 11/29/2012 with altered mental status and shortness of breath. Apparently, the patient was found by EMS lying  in the floor at home lethargic and noncommunicative. In the emergency department, he was hypothermic with a temperature of 5F. His ABG on 60% oxygen revealed a pH of 7.17, PCO2 of 104, and PO2 of 61. His urine drug screen was positive for barbiturates and benzodiazepines. His lactic acid level was within normal limits at 1.2. His glucose was 212. His chest x-ray revealed stable cardiomegaly and unchanged increased interstitial opacity. He was intubated in the emergency department. He was admitted to the ICU thereafter.  Hospital Course:   The patient was given a warming "blanket". His temperature improved rather quickly. The patient was started on IV Levaquin, IV Solu-Medrol, Atrovent/albuterol nebulizers, and gentle IV fluids. An NG tube was placed for enteral feedings and medications. Pulmonologist, Dr. Kari Warren was consulted to assist with ventilator management and the sedation protocol while on the ventilator. Coumadin was withheld. IV heparin was started for anticoagulation for treatment of his atrial fibrillation. Diltiazem was restarted, but was given IV. He was maintained on Lasix therapy at 40 mg IV twice a day initially. Multiple chest x-rays and ABGs were ordered for monitoring/evaluation. The patient became more agitated on the ventilator. Both fentanyl and propofol drips had to be started. Seroquel and gabapentin via the tube were eventually restarted per home dosing. The patient was extubated on 12/01/2012. However shortly thereafter, he became extremely agitated and he is respiratory status decompensated and he had to be reintubated.   1. COPD with exacerbation. The patient was continued on IV Levaquin, IV Solu-Medrol, and bronchodilators. The Solu-Medrol has now been tapered down to 60 mg IV every 24 hours. He has been on antibiotics since admission on 11/29/2012. He continues to have some rhonchus wheezing, but overall less impressive. Pharmacy noted potential worsening prolonged QTC  with Levaquin-Seroquel combination. Seroquel eventually discontinued.  Acute ventilator respiratory failure superimposed on chronic respiratory failure. The patient was maintained on management as above. Dr. Juanetta Warren continued to follow the patient and made recommendations. Because of the patient's ongoing agitation with any attempt at weaning, it was believed that he would just simply need to be extubated without being weaned. Nevertheless, another weaning trial was attempted. The patient became more agitated, tachycardic, and hypotensive. It was believed that he would likely need long-term ventilator management. Therefore, the personnel at [facility] was asked to evaluate him for long-term ventilator management. Both Dr. Juanetta Warren and I believe that this would be the best management route for him. The patient was accepted in transfer. His clinical status and transfer option was discussed with his sister. She was in full agreement.   Obstructive sleep apnea. Treatment as above.   Paroxysmal atrial fibrillation. Rate is controlled on every 6 hour IV diltiazem except that his rate does increase with attempted weaning trials. Coumadin is on hold and he is being anticoagulated with IV heparin.  Hypertension. Currently controlled. On diltiazem and propanolol chronically. Propanolol is on hold.   Chronic diastolic heart failure. IV Lasix was discontinued due to hypernatremia. Enteral Lasix started at 20 mg twice a day on 12/03/2012. Chest x-ray revealed no pulmonary edema.   Hypernatremia. Likely secondary to hypovolemia in the setting of IV Lasix.Marland Kitchen Resolved with decrease in Lasix and start of free water.   Hypokalemia. Repleted in the IV fluids and via the tube. Enteral potassium has been discontinued as his potassium level is 4.8 today. We'll decrease potassium and IV fluids from 40 mEq to 20 mEq. Magnesium level within normal limits.   Diabetes mellitus. Capillary glucose has been intermittently elevated in  the setting of IV steroids. He is on resistant scale every 4 hours NovoLog. Levemir was recently increased. Additional NovoLog was added because of increasing capillary Warren glucose. This was recommendation by the diabetes coordinator. His capillary Warren glucose will need to be monitored closely.   Nutrition: On tube feedings. Titrate as tolerated. DVT prophylaxis. On full dose IV heparin  GI prophylaxis: On protonix.     Procedures: Intubation and mechanical ventilation, extubated, and then reintubated. Right upper extremity PICC, 12/03/2012.  Consultations:  Pulmonologist, Sean Warren M.D.  Discharge Exam: Filed Vitals:   12/05/12 1400  BP: 121/93  Pulse: 110  Temp:   Resp: 17  Temperature afebrile.  General: Obese large framed 57 year old Caucasian man sedated on the ventilator. Cardiovascular: irregular, irregular, with tachycardia Respiratory: few scattered crackles auscultated anteriorly. Abdomen: obese, positive bowel sounds, nontender, nondistended. Musculoskeletal: no acute hot red joints. Neurologic: He is alert and nods as if he understands. He moves all of his extremities spontaneously.   Discharge Instructions  Discharge Orders   Future Orders Complete By Expires   Increase activity slowly  As directed        Medication List    STOP taking these medications       diazepam 10 MG tablet  Commonly known as:  VALIUM     diltiazem 120 MG 12 hr capsule  Commonly known as:  CARDIZEM SR  Replaced by:  diltiazem 25 MG/5ML Soln injection     glimepiride 2 MG tablet  Commonly known as:  AMARYL     HYDROcodone-acetaminophen 10-325 MG per tablet  Commonly known as:  NORCO     lisinopril 40 MG tablet  Commonly known as:  PRINIVIL,ZESTRIL     potassium chloride 10 MEQ tablet  Commonly known as:  K-DUR  propranolol ER 60 MG 24 hr capsule  Commonly known as:  INDERAL LA     QUEtiapine 300 MG tablet  Commonly known as:  SEROQUEL     simvastatin 80  MG tablet  Commonly known as:  ZOCOR     warfarin 5 MG tablet  Commonly known as:  COUMADIN      TAKE these medications       albuterol (5 MG/ML) 0.5% nebulizer solution  Commonly known as:  PROVENTIL  Take 0.5 mLs (2.5 mg total) by nebulization every 4 (four) hours.     antiseptic oral rinse Liqd  15 mLs by Mouth Rinse route 2 times daily at 12 noon and 4 pm.     bisacodyl 10 MG suppository  Commonly known as:  DULCOLAX  Place 1 suppository (10 mg total) rectally daily at 12 noon.     chlorhexidine 0.12 % solution  Commonly known as:  PERIDEX  Use as directed 15 mLs in the mouth or throat 2 (two) times daily.     diltiazem 25 MG/5ML Soln injection  Commonly known as:  CARDIZEM  Inject 3 mLs (15 mg total) into the vein every 6 (six) hours.     feeding supplement (OXEPA) Liqd  Place 1,000 mLs into feeding tube daily.     fentaNYL Soln  Commonly known as:  SUBLIMAZE  Inject 25-100 mcg into the vein every 6 (six) hours as needed (to achieve analgesia).     furosemide 20 MG tablet  Commonly known as:  LASIX  Take 1 tablet (20 mg total) by mouth 2 (two) times daily.     heparin 100-0.45 UNIT/ML-% infusion  Inject 1,450 Units/hr into the vein continuous.     insulin aspart 100 UNIT/ML injection  Commonly known as:  novoLOG  Inject 0-20 Units into the skin every 4 (four) hours.     insulin aspart 100 UNIT/ML injection  Commonly known as:  novoLOG  Inject 3 Units into the skin every 4 (four) hours.     insulin glargine 100 UNIT/ML injection  Commonly known as:  LANTUS  Inject 0.25 mLs (25 Units total) into the skin at bedtime.     ipratropium 0.02 % nebulizer solution  Commonly known as:  ATROVENT  Take 2.5 mLs (0.5 mg total) by nebulization every 4 (four) hours.     levofloxacin 750 MG/150ML Soln  Commonly known as:  LEVAQUIN  Inject 150 mLs (750 mg total) into the vein daily.     LORazepam 2 MG/ML injection  Commonly known as:  ATIVAN  Inject 0.5 mLs (1 mg  total) into the vein every 2 (two) hours as needed for anxiety.     methylPREDNISolone sodium succinate 125 mg/2 mL injection  Commonly known as:  SOLU-MEDROL  Inject 0.96 mLs (60 mg total) into the vein daily.     pantoprazole 40 MG injection  Commonly known as:  PROTONIX  Inject 40 mg into the vein daily.     propofol 10 mg/ml injection  Commonly known as:  DIPRIVAN  Inject 610.5-6,105 mcg/min into the vein continuous.     sodium chloride 0.9 % SOLN 200 mL with fentaNYL 0.05 MG/ML SOLN 2,500 mcg  Inject 25 mcg/hr into the vein continuous.       Allergies  Allergen Reactions  . Flexeril [Cyclobenzaprine] Other (See Comments)    Alters Mental Status-ALL MUSCLE RELAXERS  . Muscle Relief [Capsaicin]     Hysteria       The results of significant diagnostics  from this hospitalization (including imaging, microbiology, ancillary and laboratory) are listed below for reference.    Significant Diagnostic Studies: Ct Head Wo Contrast  11/27/2012   *RADIOLOGY REPORT*  Clinical Data:  History of injury from fall.  History of disorientation and confusion and agitation.  History of hypertension, diabetes, CVA, and bipolar disorder.  CT HEAD WITHOUT CONTRAST  Technique: Contiguous axial images were obtained from the base of the skull through the vertex without contrast.  History that the patient was disoriented and confused and could not hold still. Best obtainable scan was obtained.  Comparison:  None  Findings: Motion artifact degrades some of the images.  Two sequences of images were obtained.  There is no evidence of brain mass, brain hemorrhage, or acute infarction.  There is a tiny hypodense area is seen in the deep right parietal region consistent with a tiny area of previous lacunar infarction.  The ventricular system is normal size and shape.  There is no evidence of shift of midline structures, parenchymal lesion, or subdural or epidural hematoma.  The calvarium is intact.  Mastoids are  well aerated.  No sinusitis is evident.  IMPRESSION:  Motion artifact degrades some of the images.  There is no evidence of brain mass, brain hemorrhage, or acute infarction.  There is a tiny hypodense area is seen in the deep right parietal region consistent with a tiny area of previous lacunar infarction.  No acute or active brain process is seen.  No skull lesion is evident.  No sinusitis is evident.   Original Report Authenticated By: Onalee Hua Call   Dg Chest 1v Repeat Same Day  12/01/2012   CLINICAL DATA:  Respiratory failure and re-intubation.  EXAM: CHEST - 1 VIEW SAME DAY  COMPARISON:  12/01/2012  FINDINGS: Endotracheal tube present which is within 1 cm of the carina. Lungs show bibasilar atelectasis. No pulmonary edema, airspace consolidation or pleural fluid is identified. There is stable cardiomegaly.  IMPRESSION: Endotracheal tube tip within 1 cm of the carina.   Electronically Signed   By: Irish Lack   On: 12/01/2012 18:17   Dg Chest Port 1 View  12/05/2012   CLINICAL DATA:  Respiratory failure.  EXAM: PORTABLE CHEST - 1 VIEW  COMPARISON:  12/04/2012.  FINDINGS: Endotracheal tube tip 2.7 cm above the Carina. Left PICC line tip curled upon itself possibly entering the azygos vein. This represents a change from the prior exam.  Asymmetric airspace disease may represent pulmonary edema.  Bibasilar opacities may represent overlying soft tissue combined with pulmonary vascular prominence however, pleural effusions, basilar atelectasis or infiltrates could not be excluded given this appearance.  Cardiomegaly.  Calcified mildly tortuous aorta.  This is a call report.  IMPRESSION: Asymmetric airspace disease may represent pulmonary edema. Bibasilar opacities may represent overlying soft tissue combined with pulmonary vascular prominence however, pleural effusions, basilar atelectasis or infiltrates could not be excluded given this appearance.  Change in appearance of PICC line tip possibly entering the  azygos vein.   Electronically Signed   By: Bridgett Larsson   On: 12/05/2012 08:30   Dg Chest Port 1 View  12/04/2012   CLINICAL DATA:  Respiratory failure  EXAM: PORTABLE CHEST - 1 VIEW  COMPARISON:  12/03/2012  FINDINGS: Cardiomegaly again noted. Study is limited by patient's large body habitus. Stable endotracheal and NG tube position. Again noted central vascular congestion and hazy bilateral basilar atelectasis or infiltrate. No convincing pulmonary edema.  IMPRESSION: Stable support apparatus. Again noted central vascular  congestion and hazy bilateral basilar atelectasis or infiltrate. No pulmonary edema.   Electronically Signed   By: Natasha Mead   On: 12/04/2012 09:46   Dg Chest Port 1 View  12/03/2012   CLINICAL DATA:  PICC placement  EXAM: PORTABLE CHEST - 1 VIEW  COMPARISON:  12/02/2012  FINDINGS: Left arm PICC is difficult to see due to underpenetration. Tip appears to be in the SVC at the cavoatrial junction.  Endotracheal tube in good position. NG tube is in place with tip not visualized.  Increase in bibasilar atelectasis and vascular congestion.  IMPRESSION: PICC tip in the region of the SVC.  Increase in vascular congestion and bibasilar atelectasis.   Electronically Signed   By: Marlan Palau M.D.   On: 12/03/2012 15:03   Dg Chest Port 1 View  12/02/2012   *RADIOLOGY REPORT*  Clinical Data: Respiratory failure.  Respiratory distress.  PORTABLE CHEST - 1 VIEW  Comparison: 12/01/2012.  Findings: Cardiomegaly.  Endotracheal tube remains present with the tip 36 mm from the carina.  No focal consolidation.  Basilar atelectasis. Monitoring leads are projected over the chest. No pneumothorax. Patient rotated to the right.  IMPRESSION:  1.  Endotracheal tube tip 36 mm from the carina. 2.  Cardiomegaly without failure.   Original Report Authenticated By: Andreas Newport, M.D.   Dg Chest Port 1 View  12/01/2012   *RADIOLOGY REPORT*  Clinical Data: Evaluate endotracheal tube.  PORTABLE CHEST - 1 VIEW   Comparison: 11/30/2012  Findings: Endotracheal tube is roughly 5.8 cm above the carina. The nasogastric tube extends into the upper abdomen but the tip is difficult to visualize.  Again noted are prominent interstitial markings concerning for congestion or mild edema. Improved aeration at the left lung base.  Heart size remains upper limits of normal.  IMPRESSION: Slightly improved aeration at the left lung base.  Prominent interstitial and vascular markings are compatible with mild edema.  Support apparatuses as described.   Original Report Authenticated By: Richarda Overlie, M.D.   Dg Chest Port 1 View  11/30/2012   CLINICAL DATA:  57 year old male with respiratory failure. Intubated.  EXAM: PORTABLE CHEST - 1 VIEW  COMPARISON:  11/29/2012 and earlier.  FINDINGS: Portable AP semi upright view at 0739 hr. The patient is more rotated to the right. Stable endotracheal tube. Stable visible enteric tube. Stable cardiomegaly and mediastinal contours. Increased left retrocardiac opacity. Skin fold artifact in the right lung. No pneumothorax. Increased vascular congestion. No overt edema. No large effusion.  IMPRESSION: 1. Rotated to the left. Stable lines and tubes.  2. Increased left lower lobe collapse or consolidation.  3. Increased vascular congestion without overt edema.   Electronically Signed   By: Augusto Gamble M.D.   On: 11/30/2012 07:59   Dg Chest Portable 1 View  11/29/2012   CLINICAL DATA:  Endotracheal tube placement  EXAM: PORTABLE CHEST - 1 VIEW  COMPARISON:  11/29/2012  FINDINGS: Borderline cardiomegaly again noted. No acute infiltrate or pulmonary edema. Stable left basilar atelectasis or scarring. Endotracheal tube in place with tip 4.8 cm above the carinal. NG tube in place. No diagnostic pneumothorax. Mild right basilar atelectasis.  IMPRESSION: No acute infiltrate or pulmonary edema. Stable left basilar atelectasis or scarring. Endotracheal tube in place with tip 4.8 cm above the carinal. NG tube in  place. No diagnostic pneumothorax.   Electronically Signed   By: Natasha Mead   On: 11/29/2012 12:17   Dg Chest Port 1 View  11/29/2012  CLINICAL DATA:  57 year old male respiratory distress.  EXAM: PORTABLE CHEST - 1 VIEW  COMPARISON:  11/27/2012 and earlier.  FINDINGS: Portable AP upright view at 1055 hr. Stable cardiomegaly and mediastinal contours. Stable diffuse increased interstitial opacity, not significantly changed from prior studies. No pleural effusion or pneumothorax. No consolidation.  IMPRESSION: Stable cardiomegaly and unchanged increased interstitial opacity which could reflect chronic/recurrent interstitial edema or chronic lung disease.   Electronically Signed   By: Augusto Gamble M.D.   On: 11/29/2012 11:03   Dg Chest Port 1 View  11/27/2012   CLINICAL DATA:  Shortness of Breath  EXAM: PORTABLE CHEST - 1 VIEW  COMPARISON:  11/17/2012  FINDINGS: Cardiomegaly and central mild vascular congestion again noted. Mild interstitial prominence bilaterally with slight worsening in the aeration suspicious for mild interstitial edema. No focal consolidation.  IMPRESSION: Cardiomegaly. Central mild vascular congestion.Mild interstitial prominence bilaterally with slight worsening in the aeration suspicious for mild interstitial edema. No focal consolidation.   Electronically Signed   By: Natasha Mead   On: 11/27/2012 10:02   Dg Chest Portable 1 View  11/17/2012   *RADIOLOGY REPORT*  Clinical Data: Shortness of breath.  Drug overdose.  PORTABLE CHEST - 1 VIEW  Comparison: Chest radiograph 10/31/2012  Findings: Cardiopericardial silhouette appears prominent for portable technique, and is stable.  There is diffuse pulmonary vascular congestion and diffuse interstitial prominence.   There are bibasilar opacities, left greater than right.  Left pleural effusion cannot be excluded.  No significant change in aeration of the lungs compared to 10/31/2012.  IMPRESSION: Similar appearance of the chest compared to  10/31/2012. Similar congestive heart failure pattern.  Bibasilar opacities, left greater than right, could reflect a combination of atelectasis and possibly pleural effusion.  Airspace disease secondary to aspiration or pneumonia cannot be excluded at the lung bases by chest radiograph.   Original Report Authenticated By: Britta Mccreedy, M.D.   Dg Chest Port 1v Same Day  12/05/2012   CLINICAL DATA:  Repositioning of PICC line.  EXAM: PORTABLE CHEST - 1 VIEW SAME DAY  COMPARISON:  Plain film of the chest 11/1912 at 13:02 hours  FINDINGS: The patient's PICC has been repositioned with the tip now projecting over the mid to lower superior vena cava. The patient's examination is otherwise unchanged.  IMPRESSION: Tip of right PICC now projects over the mid to lower superior vena cava. No new abnormality.   Electronically Signed   By: Drusilla Kanner M.D.   On: 12/05/2012 14:36   Dg Chest Port 1v Same Day  12/05/2012   CLINICAL DATA:  Evaluate PICC line placement.  EXAM: PORTABLE CHEST - 1 VIEW SAME DAY  COMPARISON:  12/05/2012 7:24 a.m.  FINDINGS: Left PICC line tip curled at the level of the azygos vein. This may enter the azygos vein but cannot be assessed adequately without a lateral view.  Endotracheal tube tip 3.4 cm above the carina.  Cardiomegaly.  Pulmonary vascular prominence most notable centrally.  Lung apices not entirely included on present exam. No gross pneumothorax.  IMPRESSION: Left PICC line tip curled at the level of the azygos vein. This may enter the azygos vein but cannot be assessed adequately without a lateral view.  Insert core report   Electronically Signed   By: Bridgett Larsson   On: 12/05/2012 13:25    Microbiology: Recent Results (from the past 240 hour(s))  URINE CULTURE     Status: None   Collection Time    11/29/12 12:37 PM  Result Value Range Status   Specimen Description URINE, CATHETERIZED   Final   Special Requests NONE   Final   Culture  Setup Time     Final   Value:  11/29/2012 21:50     Performed at [facility] Count     Final   Value: NO GROWTH     Performed at [facility]   Culture     Final   Value: NO GROWTH     Performed at [facility]   Report Status 11/30/2012 FINAL   Final  CULTURE, RESPIRATORY (NON-EXPECTORATED)     Status: None   Collection Time    11/29/12  1:05 PM      Result Value Range Status   Specimen Description TRACHEAL ASPIRATE   Final   Special Requests NONE   Final   Gram Stain     Final   Value: MODERATE WBC PRESENT, PREDOMINANTLY PMN     RARE SQUAMOUS EPITHELIAL CELLS PRESENT     NO ORGANISMS SEEN     Performed at [facility]   Culture     Final   Value: FEW MORAXELLA CATARRHALIS(BRANHAMELLA)     Note: BETA LACTAMASE POSITIVE     Performed at [facility]   Report Status 12/02/2012 FINAL   Final     Labs: Basic Metabolic Panel:  Recent Labs Lab 11/30/12 0529 12/01/12 0428 12/03/12 0534 12/04/12 0506 12/05/12 0445  NA 144 146* 147* 143 138  K 3.8 3.3* 3.1* 3.6 4.8  CL 99 101 102 101 102  CO2 34* 35* 36* 38* 36*  GLUCOSE 207* 242* 295* 238* 229*  BUN 28* 34* 33* 28* 29*  CREATININE 1.28 1.39* 1.19 1.07 1.13  CALCIUM 9.1 9.1 8.7 8.9 8.6  MG  --   --   --  2.5  --    Liver Function Tests:  Recent Labs Lab 11/29/12 1100 12/04/12 0506  AST 10 9  ALT 13 <5  ALKPHOS 120* 63  BILITOT 0.3 0.3  PROT 7.5 5.5*  ALBUMIN 3.3* 2.6*   No results found for this basename: LIPASE, AMYLASE,  in the last 168 hours  Recent Labs Lab 11/29/12 1101  AMMONIA 20   CBC:  Recent Labs Lab 11/29/12 1100 12/01/12 0428 12/03/12 0534 12/04/12 0506 12/05/12 0445  WBC 8.8 7.1 5.2 5.8 7.4  NEUTROABS 7.0  --   --   --   --   HGB 13.7 11.2* 11.7* 11.7* 11.1*  HCT 44.1 35.7* 36.9* 37.1* 35.4*  MCV 102.8* 99.4 100.5* 100.3* 100.6*  PLT 197 223 198 173 154   Cardiac Enzymes: No results found for this basename: CKTOTAL, CKMB, CKMBINDEX, TROPONINI,  in the last 168  hours BNP: BNP (last 3 results)  Recent Labs  10/31/12 1355 11/17/12 2032 11/27/12 0924  PROBNP 1622.0* 1300.0* 2577.0*   CBG:  Recent Labs Lab 12/04/12 1959 12/04/12 2357 12/05/12 0441 12/05/12 0734 12/05/12 1129  GLUCAP 333* 246* 216* 209* 205*       Signed:  Miamor Ayler  [facility] 12/05/2012, 2:46 PM

## 2012-12-05 NOTE — Progress Notes (Signed)
[facility] PROGRESS NOTE  Sean Warren WUJ:[id number] DOB: [birth year] DOA: 11/29/2012 PCP: [name], MD    Code Status: Full code. Family Communication: family not available. Disposition Plan: to be determined.   Consultants:  Pulmonologist, Dr. [name].  Procedures:  Intubation and mechanical ventilation 11/29/2012, then extubated, then re\re intubated.  Right upper extremity PICC 12/03/2012  Antibiotics:  IV Levaquin 12/04/12   HPI/Subjective: The patient is intubated. Weaning trial is starting. He is alert and nods occasionally.  Objective: Filed Vitals:   12/05/12 1030  BP:   Pulse: 129  Temp:   Resp: 18   Blood pressure blood pressure 94/56 temperature 98.2   Intake/Output Summary (Last 24 hours) at 12/05/12 1113 Last data filed at 12/05/12 1100  Gross per 24 hour  Intake 5703.74 ml  Output   2750 ml  Net 2953.74 ml   Filed Weights   12/01/12 0500 12/04/12 0500 12/05/12 0500  Weight: 122.1 kg (269 lb 2.9 oz) 120.1 kg (264 lb 12.4 oz) 124.2 kg (273 lb 13 oz)    Exam:   General:  Obese large framed 57 year old Caucasian man sedated on the ventilator.  Cardiovascular: irregular, irregular, with tachycardia  Respiratory: few scattered crackles auscultated anteriorly.  Abdomen: obese, positive bowel sounds, nontender, nondistended.  Musculoskeletal: no acute hot red joints.  Neurologic: He is alert and nods as if he understands. He moves all of his extremities spontaneously.  Data Reviewed: Basic Metabolic Panel:  Recent Labs Lab 11/30/12 0529 12/01/12 0428 12/03/12 0534 12/04/12 0506 12/05/12 0445  NA 144 146* 147* 143 138  K 3.8 3.3* 3.1* 3.6 4.8  CL 99 101 102 101 102  CO2 34* 35* 36* 38* 36*  GLUCOSE 207* 242* 295* 238* 229*  BUN 28* 34* 33* 28* 29*  CREATININE 1.28 1.39* 1.19 1.07 1.13  CALCIUM 9.1 9.1 8.7 8.9 8.6  MG  --   --   --  2.5  --    Liver Function Tests:  Recent Labs Lab 11/29/12 1100 12/04/12 0506   AST 10 9  ALT 13 <5  ALKPHOS 120* 63  BILITOT 0.3 0.3  PROT 7.5 5.5*  ALBUMIN 3.3* 2.6*   No results found for this basename: LIPASE, AMYLASE,  in the last 168 hours  Recent Labs Lab 11/29/12 1101  AMMONIA 20   CBC:  Recent Labs Lab 11/29/12 1100 12/01/12 0428 12/03/12 0534 12/04/12 0506 12/05/12 0445  WBC 8.8 7.1 5.2 5.8 7.4  NEUTROABS 7.0  --   --   --   --   HGB 13.7 11.2* 11.7* 11.7* 11.1*  HCT 44.1 35.7* 36.9* 37.1* 35.4*  MCV 102.8* 99.4 100.5* 100.3* 100.6*  PLT 197 223 198 173 154   Cardiac Enzymes: No results found for this basename: CKTOTAL, CKMB, CKMBINDEX, TROPONINI,  in the last 168 hours BNP (last 3 results)  Recent Labs  10/31/12 1355 11/17/12 2032 11/27/12 0924  PROBNP 1622.0* 1300.0* 2577.0*   CBG:  Recent Labs Lab 12/04/12 1542 12/04/12 1959 12/04/12 2357 12/05/12 0441 12/05/12 0734  GLUCAP 310* 333* 246* 216* 209*    Recent Results (from the past 240 hour(s))  URINE CULTURE     Status: None   Collection Time    11/29/12 12:37 PM      Result Value Range Status   Specimen Description URINE, CATHETERIZED   Final   Special Requests NONE   Final   Culture  Setup Time     Final   Value: 11/29/2012 21:50  Performed at [facility] Count     Final   Value: NO GROWTH     Performed at [facility]   Culture     Final   Value: NO GROWTH     Performed at [facility]   Report Status 11/30/2012 FINAL   Final  CULTURE, RESPIRATORY (NON-EXPECTORATED)     Status: None   Collection Time    11/29/12  1:05 PM      Result Value Range Status   Specimen Description TRACHEAL ASPIRATE   Final   Special Requests NONE   Final   Gram Stain     Final   Value: MODERATE WBC PRESENT, PREDOMINANTLY PMN     RARE SQUAMOUS EPITHELIAL CELLS PRESENT     NO ORGANISMS SEEN     Performed at [facility]   Culture     Final   Value: FEW MORAXELLA CATARRHALIS(BRANHAMELLA)     Note: BETA LACTAMASE POSITIVE      Performed at [facility]   Report Status 12/02/2012 FINAL   Final     Studies: Dg Chest Port 1 View  12/05/2012   CLINICAL DATA:  Respiratory failure.  EXAM: PORTABLE CHEST - 1 VIEW  COMPARISON:  12/04/2012.  FINDINGS: Endotracheal tube tip 2.7 cm above the Carina. Left PICC line tip curled upon itself possibly entering the azygos vein. This represents a change from the prior exam.  Asymmetric airspace disease may represent pulmonary edema.  Bibasilar opacities may represent overlying soft tissue combined with pulmonary vascular prominence however, pleural effusions, basilar atelectasis or infiltrates could not be excluded given this appearance.  Cardiomegaly.  Calcified mildly tortuous aorta.  This is a call report.  IMPRESSION: Asymmetric airspace disease may represent pulmonary edema. Bibasilar opacities may represent overlying soft tissue combined with pulmonary vascular prominence however, pleural effusions, basilar atelectasis or infiltrates could not be excluded given this appearance.  Change in appearance of PICC line tip possibly entering the azygos vein.   Electronically Signed   By: Bridgett Larsson   On: 12/05/2012 08:30   Dg Chest Port 1 View  12/04/2012   CLINICAL DATA:  Respiratory failure  EXAM: PORTABLE CHEST - 1 VIEW  COMPARISON:  12/03/2012  FINDINGS: Cardiomegaly again noted. Study is limited by patient's large body habitus. Stable endotracheal and NG tube position. Again noted central vascular congestion and hazy bilateral basilar atelectasis or infiltrate. No convincing pulmonary edema.  IMPRESSION: Stable support apparatus. Again noted central vascular congestion and hazy bilateral basilar atelectasis or infiltrate. No pulmonary edema.   Electronically Signed   By: Natasha Mead   On: 12/04/2012 09:46   Dg Chest Port 1 View  12/03/2012   CLINICAL DATA:  PICC placement  EXAM: PORTABLE CHEST - 1 VIEW  COMPARISON:  12/02/2012  FINDINGS: Left arm PICC is difficult to see due to  underpenetration. Tip appears to be in the SVC at the cavoatrial junction.  Endotracheal tube in good position. NG tube is in place with tip not visualized.  Increase in bibasilar atelectasis and vascular congestion.  IMPRESSION: PICC tip in the region of the SVC.  Increase in vascular congestion and bibasilar atelectasis.   Electronically Signed   By: Marlan Palau M.D.   On: 12/03/2012 15:03    Scheduled Meds: . ipratropium  0.5 mg Nebulization Q4H   And  . albuterol  2.5 mg Nebulization Q4H  . antiseptic oral rinse  15 mL Mouth Rinse q12n4p  .  antiseptic oral rinse  15 mL Mouth Rinse QID  . chlorhexidine  15 mL Mouth Rinse BID  . diltiazem  15 mg Intravenous Q6H  . feeding supplement (OXEPA)  1,000 mL Per Tube Q24H  . furosemide  20 mg Oral BID  . gabapentin  600 mg Oral Q8H  . insulin aspart  0-20 Units Subcutaneous Q4H  . insulin glargine  25 Units Subcutaneous QHS  . levofloxacin (LEVAQUIN) IV  750 mg Intravenous Q24H  . methylPREDNISolone (SOLU-MEDROL) injection  60 mg Intravenous Q24H  . pantoprazole (PROTONIX) IV  40 mg Intravenous Q24H  . potassium chloride  20 mEq Oral BID  . sodium chloride  10-40 mL Intracatheter Q12H  . sodium chloride  3 mL Intravenous Q12H   Continuous Infusions: . dextrose 5 % with kcl 70 mL/hr at 12/04/12 2248  . fentaNYL infusion INTRAVENOUS 75 mcg/hr (12/05/12 1100)  . heparin 1,450 Units/hr (12/05/12 1100)  . propofol 25 mcg/kg/min (12/05/12 1100)    Assessment:  Principal Problem:   COPD exacerbation Active Problems:   Acute-on-chronic respiratory failure   Hypokalemia   Paroxysmal a-fib   Obstructive sleep apnea   CVA (cerebral infarction)   DM (diabetes mellitus)   HTN (hypertension)   Obesity (BMI 30.0-34.9)   Chronic respiratory failure with hypoxia   Chronic diastolic heart failure   Hypothermia   Hypernatremia  1. COPD with exacerbation. We'll continue steroids, Levaquin and bronchodilators. Pharmacy noted potential  worsening prolonged QTC with Levaquin-quinapril combination. Apparently quinapril was discontinued by overnight physician.  Acute ventilator respiratory failure superimposed on chronic respiratory failure. He is undergoing weaning trial. He appears to be tachycardic and his blood pressure is decreasing. He may not be ready for extubation. Adjustments in ventilator settings and management per Dr. [name].  Obstructive sleep apnea. Treatment as above.   Paroxysmal atrial fibrillation. Rate was controlled on every 6 hour IV diltiazem. Current tachycardia may be the consequence of respiratory distress with a weaning trial. Will address further once the patient is rested. On chronic Coumadin, being held. On IV heparin instead.  Hypertension. Currently controlled. On diltiazem and propanolol chronically. Propanolol on hold, but was ordered via enteric route.  Chronic diastolic heart failure. IV Lasix discontinued due to hypernatremia. Oral Lasix started at 20 mg twice a day on 12/03/2012. Chest x-ray reveals no pulmonary edema.  Hypernatremia. Likely secondary to hypovolemia. Resolved with decrease in Lasix and start her free water.  Hypokalemia. Repleted. We'll discontinue it for now as his serum potassium has increased significantly. We'll continue to follow. Magnesium level within normal limits.  Diabetes mellitus. Capillary glucose is elevated in the setting of IV steroids. Levemir recently increased. Will add q. 4 hour NovoLog per diabetes corrugators recommendation.  Nutrition: On tube feedings.  DVT prophylaxis. On full dose IV heparin  GI prophylaxis: On protonix.   Plan:  1. Add 3 units of NovoLog every 4 hours. 2. Discontinue enteral potassium. Continue to follow potassium levels. 3. Weaning trial and ventilator management recommendations per Dr. [name]. 4. Monitor heart rate. If still with rapid ventricular response when rested, we'll consider Cardizem drip.     Time spent: 40  minutes.    [facility]  [facility] Pager [phone] If 7PM-7AM, please contact night-coverage at [email or web address], password [facility] 12/05/2012, 11:13 AM  LOS: 6 days

## 2012-12-05 NOTE — Progress Notes (Signed)
TRANSFER REPORT CALLED TO Liz Claiborne RN AT [facility] IN ICU ROOM 414. OT REMAINS INTUBATED. #8 ETT. 22 ATL. R 14  TV 550. FIO2 60% . OG TUBE FLUSHED W/ WATER AD CLAMPED. LT TRIPLE LUMEN PICC LINE PATENT. D5 W/ KCL AT 70CC/HR. HEPARIN AT 1450 UNITS/HR. FENTANYL  AT . PROPOFOL AT 60MCG/KG/MIN. FOLEY CATH PATENT DRAINING LIGHT GREEN URINE, VSS. BEING TRANSPORTED TO [facility] BY CARE LINK STAFF.

## 2012-12-12 ENCOUNTER — Encounter: Payer: Self-pay | Admitting: *Deleted

## 2013-02-03 ENCOUNTER — Encounter (HOSPITAL_COMMUNITY): Payer: Self-pay | Admitting: Emergency Medicine

## 2013-02-03 ENCOUNTER — Emergency Department (HOSPITAL_COMMUNITY): Payer: Medicare Other

## 2013-02-03 ENCOUNTER — Emergency Department (HOSPITAL_COMMUNITY)
Admission: EM | Admit: 2013-02-03 | Discharge: 2013-02-04 | Discharge status: Against Medical Advice | Payer: Medicare Other | Attending: Emergency Medicine | Admitting: Emergency Medicine

## 2013-02-03 DIAGNOSIS — IMO0002 Reserved for concepts with insufficient information to code with codable children: Secondary | ICD-10-CM | POA: Insufficient documentation

## 2013-02-03 DIAGNOSIS — E785 Hyperlipidemia, unspecified: Secondary | ICD-10-CM | POA: Insufficient documentation

## 2013-02-03 DIAGNOSIS — I5032 Chronic diastolic (congestive) heart failure: Secondary | ICD-10-CM | POA: Insufficient documentation

## 2013-02-03 DIAGNOSIS — G8911 Acute pain due to trauma: Secondary | ICD-10-CM | POA: Insufficient documentation

## 2013-02-03 DIAGNOSIS — Z7901 Long term (current) use of anticoagulants: Secondary | ICD-10-CM | POA: Insufficient documentation

## 2013-02-03 DIAGNOSIS — F17200 Nicotine dependence, unspecified, uncomplicated: Secondary | ICD-10-CM | POA: Insufficient documentation

## 2013-02-03 DIAGNOSIS — I4891 Unspecified atrial fibrillation: Secondary | ICD-10-CM | POA: Insufficient documentation

## 2013-02-03 DIAGNOSIS — G8929 Other chronic pain: Secondary | ICD-10-CM | POA: Insufficient documentation

## 2013-02-03 DIAGNOSIS — I10 Essential (primary) hypertension: Secondary | ICD-10-CM | POA: Insufficient documentation

## 2013-02-03 DIAGNOSIS — E1149 Type 2 diabetes mellitus with other diabetic neurological complication: Secondary | ICD-10-CM | POA: Insufficient documentation

## 2013-02-03 DIAGNOSIS — J4489 Other specified chronic obstructive pulmonary disease: Secondary | ICD-10-CM | POA: Insufficient documentation

## 2013-02-03 DIAGNOSIS — Z79899 Other long term (current) drug therapy: Secondary | ICD-10-CM | POA: Insufficient documentation

## 2013-02-03 DIAGNOSIS — E1142 Type 2 diabetes mellitus with diabetic polyneuropathy: Secondary | ICD-10-CM | POA: Insufficient documentation

## 2013-02-03 DIAGNOSIS — F319 Bipolar disorder, unspecified: Secondary | ICD-10-CM | POA: Insufficient documentation

## 2013-02-03 DIAGNOSIS — L03116 Cellulitis of left lower limb: Secondary | ICD-10-CM

## 2013-02-03 DIAGNOSIS — Z8673 Personal history of transient ischemic attack (TIA), and cerebral infarction without residual deficits: Secondary | ICD-10-CM | POA: Insufficient documentation

## 2013-02-03 DIAGNOSIS — L02419 Cutaneous abscess of limb, unspecified: Secondary | ICD-10-CM | POA: Insufficient documentation

## 2013-02-03 DIAGNOSIS — R4182 Altered mental status, unspecified: Secondary | ICD-10-CM | POA: Insufficient documentation

## 2013-02-03 DIAGNOSIS — Z794 Long term (current) use of insulin: Secondary | ICD-10-CM | POA: Insufficient documentation

## 2013-02-03 DIAGNOSIS — Z792 Long term (current) use of antibiotics: Secondary | ICD-10-CM | POA: Insufficient documentation

## 2013-02-03 DIAGNOSIS — J449 Chronic obstructive pulmonary disease, unspecified: Secondary | ICD-10-CM | POA: Insufficient documentation

## 2013-02-03 MED ORDER — HYDROCODONE-ACETAMINOPHEN 5-325 MG PO TABS
2.0000 | ORAL_TABLET | Freq: Once | ORAL | Status: AC
  Administered 2013-02-03: 2 via ORAL
  Filled 2013-02-03: qty 2

## 2013-02-03 MED ORDER — HYDROCODONE-ACETAMINOPHEN 5-325 MG PO TABS: 2.0000 | ORAL_TABLET | ORAL | Status: DC | PRN

## 2013-02-03 MED ORDER — CEPHALEXIN 500 MG PO CAPS: 500.0000 mg | ORAL_CAPSULE | Freq: Four times a day (QID) | ORAL | Status: DC

## 2013-02-03 MED ORDER — CEPHALEXIN 500 MG PO CAPS
500.0000 mg | ORAL_CAPSULE | Freq: Once | ORAL | Status: AC
  Administered 2013-02-04: 500 mg via ORAL
  Filled 2013-02-03: qty 1

## 2013-02-03 NOTE — ED Notes (Signed)
Patient is refusing to have labs drawn.  Does have multiple bruises and caregiver states he was stuck a couple of days ago.  Patient falls asleep easily and arouses easily.  States he does not want anything done, just wants to go home to his hospital bed and rest.  MD advised to return to room to talk to patient

## 2013-02-03 NOTE — ED Notes (Signed)
Pt fell a couple of days ago and now c/o bilateral leg pain. Per family pt is not acting like himself.

## 2013-02-03 NOTE — ED Provider Notes (Signed)
CSN: [id number]     Arrival date & time 02/03/13  2234 History  This chart was scribed for Geoffery Lyons, MD by Ronal Fear, ED Scribe. This patient was seen in room APA18/APA18 and the patient's care was started at 11:12 PM.   Chief Complaint  Patient presents with  . Fall  . Altered Mental Status  . Leg Pain   (Consider location/radiation/quality/duration/timing/severity/associated sxs/prior Treatment) The history is provided by the patient and a caregiver. No language interpreter was used.    HPI Comments: Sean Warren is a 57 y.o. male with a hx of COPD who presents with his friend to the Emergency Department complaining of left leg pain after a fall onset Sunday night, pt was seen on Monday by his PCP and had the left ankle wrapped and his friend noticed discoloration around his left lower leg. When she saw the extent of the discoloration she decided to bring the pt into the ED. Pt's friend also complains that the pt is not acting like himself today, she states that he sees to be out of his mind.  Pt denies CP, HA.  Pt is currently on lasix and has had his prescription increased.    Past Medical History  Diagnosis Date  . Hypertension   . Diabetes mellitus   . Bipolar 1 disorder   . Hyperlipidemia   . Atrial fibrillation   . COPD (chronic obstructive pulmonary disease)   . Obstructive sleep apnea   . Chronic diastolic heart failure     Echocardiogram 05/14/11: Mild LVH, EF 50-55%, no wall motion abnormalities, grade 2 diastolic dysfunction.  . Chest pain     Myoview 05/17/11 demonstrated an EF of 61%, small fixed inferoseptal defect consistent with thinning vs prior infarct; no ischemia. - low risk  . Diabetic neuropathy   . Chronic back pain   . History of stroke   . Mental disorder     bipolar  . CHF (congestive heart failure)   . Stroke   . Neuromuscular disorder     diabetic neuropathy  . Chronic respiratory failure with hypoxia 08/08/2012  . Noncompliance with  medication regimen   . Tobacco abuse    Past Surgical History  Procedure Laterality Date  . No past surgeries     History reviewed. No pertinent family history. History  Substance Use Topics  . Smoking status: Current Every Day Smoker -- 1.50 packs/day for 29 years    Types: Cigarettes  . Smokeless tobacco: Never Used  . Alcohol Use: No    Review of Systems  Respiratory: Negative for shortness of breath.   Cardiovascular: Negative for chest pain.  Musculoskeletal: Positive for joint swelling.  Skin: Positive for color change.  Neurological: Negative for headaches.  All other systems reviewed and are negative.    Allergies  Flexeril and Muscle relief  Home Medications   Current Outpatient Rx  Name  Route  Sig  Dispense  Refill  . albuterol (PROVENTIL) (5 MG/ML) 0.5% nebulizer solution   Nebulization   Take 0.5 mLs (2.5 mg total) by nebulization every 4 (four) hours.         Marland Kitchen antiseptic oral rinse (BIOTENE) LIQD   Mouth Rinse   15 mLs by Mouth Rinse route 2 times daily at 12 noon and 4 pm.         . bisacodyl (DULCOLAX) 10 MG suppository   Rectal   Place 1 suppository (10 mg total) rectally daily at 12 noon.         Marland Kitchen  chlorhexidine (PERIDEX) 0.12 % solution   Mouth/Throat   Use as directed 15 mLs in the mouth or throat 2 (two) times daily.         . clonazePAM (KLONOPIN) 0.5 MG tablet   Oral   Take 1 tablet by mouth 3 (three) times daily as needed. For anxiety         . dexamethasone (DECADRON) 1 MG tablet   Oral   Take 1 mg by mouth daily.         . diazepam (VALIUM) 10 MG tablet   Oral   Take 1 tablet by mouth daily as needed.         . diltiazem (CARDIZEM) 25 MG/5ML SOLN injection   Intravenous   Inject 3 mLs (15 mg total) into the vein every 6 (six) hours.         . fentaNYL (SUBLIMAZE) SOLN   Intravenous   Inject 25-100 mcg into the vein every 6 (six) hours as needed (to achieve analgesia).         . furosemide (LASIX) 20 MG  tablet   Oral   Take 1 tablet (20 mg total) by mouth 2 (two) times daily.         Marland Kitchen glimepiride (AMARYL) 2 MG tablet   Oral   Take 2 mg by mouth daily.         . heparin 100-0.45 UNIT/ML-% infusion   Intravenous   Inject 1,450 Units/hr into the vein continuous.         Marland Kitchen HYDROcodone-acetaminophen (NORCO) 7.5-325 MG per tablet   Oral   Take 1 tablet by mouth 3 (three) times daily as needed. For pain         . insulin aspart (NOVOLOG) 100 UNIT/ML injection   Subcutaneous   Inject 0-20 Units into the skin every 4 (four) hours.         . insulin aspart (NOVOLOG) 100 UNIT/ML injection   Subcutaneous   Inject 3 Units into the skin every 4 (four) hours.         . insulin glargine (LANTUS) 100 UNIT/ML injection   Subcutaneous   Inject 0.25 mLs (25 Units total) into the skin at bedtime.         Marland Kitchen ipratropium (ATROVENT) 0.02 % nebulizer solution   Nebulization   Take 2.5 mLs (0.5 mg total) by nebulization every 4 (four) hours.         Marland Kitchen LEVEMIR 100 UNIT/ML injection               . levofloxacin (LEVAQUIN) 750 MG/150ML SOLN   Intravenous   Inject 150 mLs (750 mg total) into the vein daily.         Marland Kitchen lisinopril (PRINIVIL,ZESTRIL) 40 MG tablet   Oral   Take 40 mg by mouth daily.         Marland Kitchen lithium carbonate 300 MG capsule   Oral   Take 600 mg by mouth daily.         Marland Kitchen LORazepam (ATIVAN) 2 MG/ML injection   Intravenous   Inject 0.5 mLs (1 mg total) into the vein every 2 (two) hours as needed for anxiety.         . methylPREDNISolone sodium succinate (SOLU-MEDROL) 125 mg/2 mL injection   Intravenous   Inject 0.96 mLs (60 mg total) into the vein daily.         Marland Kitchen morphine (MS CONTIN) 15 MG 12 hr tablet   Oral   Take  15 mg by mouth 2 (two) times daily.         . Nutritional Supplements (FEEDING SUPPLEMENT, OXEPA,) LIQD   Per Tube   Place 1,000 mLs into feeding tube daily.         . pantoprazole (PROTONIX) 40 MG injection   Intravenous    Inject 40 mg into the vein daily.         . Potassium Chloride in Dextrose (DEXTROSE 5 % WITH KCL 20 MEQ / L) 20-5 MEQ/L-%   Intravenous   Inject 1,000 mLs (20 mEq total) into the vein continuous.         . promethazine (PHENERGAN) 12.5 MG tablet   Oral   Take 1 tablet by mouth daily as needed. For nausea and/or vomiting         . propofol (DIPRIVAN) 10 mg/ml injection   Intravenous   Inject 610.5-6,105 mcg/min into the vein continuous.   20 mL      . simvastatin (ZOCOR) 80 MG tablet   Oral   Take 80 mg by mouth daily.         . sodium chloride 0.9 % SOLN 200 mL with fentaNYL 0.05 MG/ML SOLN 2,500 mcg   Intravenous   Inject 25 mcg/hr into the vein continuous.         . traZODone (DESYREL) 100 MG tablet   Oral   Take 100 mg by mouth at bedtime.         Marland Kitchen warfarin (COUMADIN) 4 MG tablet   Oral   Take 4 mg by mouth as directed.          There were no vitals taken for this visit. Physical Exam  Nursing note and vitals reviewed. Constitutional: He is oriented to person, place, and time. He appears well-developed and well-nourished. No distress.  HENT:  Head: Normocephalic and atraumatic.  Eyes: EOM are normal.  Neck: Neck supple. No tracheal deviation present.  Cardiovascular: Normal rate.   Pulmonary/Chest: Effort normal. No respiratory distress. He has no wheezes. He has no rales.  Musculoskeletal: Normal range of motion.  3+ bilateral lower extremity edema.  Left leg is noted to have warmth and erythema extended to below the calf. DP pulses are palpable bilaterally.  Neurological: He is alert and oriented to person, place, and time.  Skin: Skin is warm and dry.  Psychiatric: He has a normal mood and affect. His behavior is normal.    ED Course  Procedures (including critical care time) DIAGNOSTIC STUDIES: Oxygen Saturation is 82% on room air, low by my interpretation.    COORDINATION OF CARE:    11:08 PM- Pt advised of plan for treatment  including chest X-ray and blood test and pt agrees.  Labs Review Labs Reviewed  CBC WITH DIFFERENTIAL  COMPREHENSIVE METABOLIC PANEL  PRO B NATRIURETIC PEPTIDE  TROPONIN I   Imaging Review No results found.    MDM  No diagnosis found. Patient is a 57 year old male with extensive past medical history include COPD who on home oxygen. He presents to the emergency department today with complaints of pain and swelling to the left leg. This is been going on for several days and the caretaker finally convinced him to come in to be checked out. She states that he slid out of the bed while attempting to get up several days ago but is unsure as to whether or not he injured himself.  The patient arrives here borderline febrile with what appears to be a cellulitis  of the left lower leg superimposed on pre-existing bilateral lower extremity edema. His caretaker also states that his chest is wheezy and is concerned he may have pneumonia. He was initially hypoxic on room air however when placed on the nasal cannula which he regularly wears, his saturations were adequate. I discussed this situation with the patient and caretaker and informed of my intention to perform a workup to look into these issues. I plan to perform x-rays of the leg and chest and perform blood tests to evaluate for congestive heart failure, pneumonia, and cellulitis. Patient repeatedly informed me he did not want to be here and refused to allow [place] to perform any testing. He would not allow any blood draws or x-rays to be performed, stating that his being here was "BS".  I discussed the situation with him and he appears to have decision-making capacity to refuse care. He was able to comprehend the consequences of leaving, repeat them to me, and is willing to accept them. As he did appear to have a cellulitis, I did agree to prescribe Keflex and a pain medication for him. He was informed that should he desire completion of his workup he is  welcome to return at any time.   I personally performed the services described in this documentation, which was scribed in my presence. The recorded information has been reviewed and is accurate.      Geoffery Lyons, MD 02/04/13 [phone]

## 2013-02-04 NOTE — ED Notes (Addendum)
Again, patient refusing to have labs drawn, refusing to have any xrays done.  States he wants to go home.  Caregiver Morrie Sheldon advised that she should contact his Child psychotherapist to discuss if patient is competent to make decisions, and I will also have our social worker contact social services to involve them and assure he is competent to make decisions not to be treated.  Patient states he did not want to come here and does not want to stay here for treatment.  Caregiver will follow up and understands discharge instructions.    Patient adamant he wants to go home, states he wants out of here and spelled the word OUT.  States he will get care tomorrow if he still needs or wants it.  Refuses to sign anything.  Caregiver Nonda Lou signed for dc papers  Advised he can return at any time if he should change his mind about treatment.  He acknowledged.  Patient advised by his caregiver that this RN is concerned that he may not be competent to make decisions - and that nurse will be having social services to check on him and that he may be declared incompetent to make decisions if he does not start cooperating with care- patient then apologized to RN for refusing to have test done, states he is competent to make his own decisions, just does not want to stay here and does not want anything done tonight.  He then stated "I  may come back later if I want something else done."  Left via wheelchair and home with caregiver.

## 2013-02-05 NOTE — Clinical Social Work Note (Signed)
CSW received note from RN, Vermell regarding concerns about pt regarding capacity. CSW reviewed chart and noted documentation from MD stating pt was alert and oriented to person, time, and place. It was also stated that MD felt pt had decision making capacity to refuse care. CSW followed up with Vermell to let her know that a report would not be made to DSS due to this. Pt has caregivers in the home and it was documented that he was seen at his PCP's office. Vermell made aware of pt's right to self determination since he was felt to have capacity.   Derenda Fennel, [place] [phone]

## 2013-02-08 ENCOUNTER — Inpatient Hospital Stay (HOSPITAL_COMMUNITY)
Admission: EM | Admit: 2013-02-08 | Discharge: 2013-02-11 | DRG: 602 | Discharge status: Against Medical Advice | Payer: Medicare Other | Attending: Family Medicine | Admitting: Family Medicine

## 2013-02-08 ENCOUNTER — Encounter (HOSPITAL_COMMUNITY): Payer: Self-pay | Admitting: Emergency Medicine

## 2013-02-08 DIAGNOSIS — J449 Chronic obstructive pulmonary disease, unspecified: Secondary | ICD-10-CM | POA: Diagnosis present

## 2013-02-08 DIAGNOSIS — Z79899 Other long term (current) drug therapy: Secondary | ICD-10-CM

## 2013-02-08 DIAGNOSIS — L97909 Non-pressure chronic ulcer of unspecified part of unspecified lower leg with unspecified severity: Secondary | ICD-10-CM | POA: Diagnosis present

## 2013-02-08 DIAGNOSIS — I959 Hypotension, unspecified: Secondary | ICD-10-CM | POA: Diagnosis present

## 2013-02-08 DIAGNOSIS — Z9981 Dependence on supplemental oxygen: Secondary | ICD-10-CM

## 2013-02-08 DIAGNOSIS — F17200 Nicotine dependence, unspecified, uncomplicated: Secondary | ICD-10-CM | POA: Diagnosis present

## 2013-02-08 DIAGNOSIS — Z8249 Family history of ischemic heart disease and other diseases of the circulatory system: Secondary | ICD-10-CM

## 2013-02-08 DIAGNOSIS — J9610 Chronic respiratory failure, unspecified whether with hypoxia or hypercapnia: Secondary | ICD-10-CM

## 2013-02-08 DIAGNOSIS — Z794 Long term (current) use of insulin: Secondary | ICD-10-CM

## 2013-02-08 DIAGNOSIS — I5033 Acute on chronic diastolic (congestive) heart failure: Secondary | ICD-10-CM | POA: Diagnosis present

## 2013-02-08 DIAGNOSIS — I10 Essential (primary) hypertension: Secondary | ICD-10-CM | POA: Diagnosis present

## 2013-02-08 DIAGNOSIS — F319 Bipolar disorder, unspecified: Secondary | ICD-10-CM | POA: Diagnosis present

## 2013-02-08 DIAGNOSIS — E785 Hyperlipidemia, unspecified: Secondary | ICD-10-CM | POA: Diagnosis present

## 2013-02-08 DIAGNOSIS — Z7901 Long term (current) use of anticoagulants: Secondary | ICD-10-CM

## 2013-02-08 DIAGNOSIS — I872 Venous insufficiency (chronic) (peripheral): Secondary | ICD-10-CM | POA: Diagnosis present

## 2013-02-08 DIAGNOSIS — G4733 Obstructive sleep apnea (adult) (pediatric): Secondary | ICD-10-CM | POA: Diagnosis present

## 2013-02-08 DIAGNOSIS — L02419 Cutaneous abscess of limb, unspecified: Principal | ICD-10-CM

## 2013-02-08 DIAGNOSIS — Z8673 Personal history of transient ischemic attack (TIA), and cerebral infarction without residual deficits: Secondary | ICD-10-CM

## 2013-02-08 DIAGNOSIS — I509 Heart failure, unspecified: Secondary | ICD-10-CM

## 2013-02-08 DIAGNOSIS — L03116 Cellulitis of left lower limb: Secondary | ICD-10-CM | POA: Diagnosis present

## 2013-02-08 DIAGNOSIS — J9611 Chronic respiratory failure with hypoxia: Secondary | ICD-10-CM | POA: Diagnosis present

## 2013-02-08 DIAGNOSIS — Z6838 Body mass index (BMI) 38.0-38.9, adult: Secondary | ICD-10-CM

## 2013-02-08 DIAGNOSIS — E119 Type 2 diabetes mellitus without complications: Secondary | ICD-10-CM | POA: Diagnosis present

## 2013-02-08 DIAGNOSIS — E1142 Type 2 diabetes mellitus with diabetic polyneuropathy: Secondary | ICD-10-CM | POA: Diagnosis present

## 2013-02-08 DIAGNOSIS — J4489 Other specified chronic obstructive pulmonary disease: Secondary | ICD-10-CM | POA: Diagnosis present

## 2013-02-08 DIAGNOSIS — E1149 Type 2 diabetes mellitus with other diabetic neurological complication: Secondary | ICD-10-CM | POA: Diagnosis present

## 2013-02-08 DIAGNOSIS — I4891 Unspecified atrial fibrillation: Secondary | ICD-10-CM | POA: Diagnosis present

## 2013-02-08 DIAGNOSIS — R0902 Hypoxemia: Secondary | ICD-10-CM

## 2013-02-08 DIAGNOSIS — D649 Anemia, unspecified: Secondary | ICD-10-CM | POA: Diagnosis present

## 2013-02-08 LAB — GLUCOSE, CAPILLARY
Glucose-Capillary: 118 mg/dL — ABNORMAL HIGH (ref 70–99)
Glucose-Capillary: 156 mg/dL — ABNORMAL HIGH (ref 70–99)

## 2013-02-08 LAB — CBC WITH DIFFERENTIAL/PLATELET
Basophils Relative: 0 % (ref 0–1)
Hemoglobin: 10.9 g/dL — ABNORMAL LOW (ref 13.0–17.0)
Lymphs Abs: 1.8 10*3/uL (ref 0.7–4.0)
MCHC: 31.5 g/dL (ref 30.0–36.0)
Monocytes Relative: 6 % (ref 3–12)
Neutro Abs: 11.2 10*3/uL — ABNORMAL HIGH (ref 1.7–7.7)
Neutrophils Relative %: 80 % — ABNORMAL HIGH (ref 43–77)
RBC: 3.39 MIL/uL — ABNORMAL LOW (ref 4.22–5.81)
WBC: 14 10*3/uL — ABNORMAL HIGH (ref 4.0–10.5)

## 2013-02-08 LAB — BASIC METABOLIC PANEL
GFR calc Af Amer: 69 mL/min — ABNORMAL LOW (ref >90)
GFR calc non Af Amer: 59 mL/min — ABNORMAL LOW (ref >90)
Potassium: 3.6 mEq/L (ref 3.5–5.1)
Sodium: 138 mEq/L (ref 135–145)

## 2013-02-08 LAB — PRO B NATRIURETIC PEPTIDE: Pro B Natriuretic peptide (BNP): 3938 pg/mL — ABNORMAL HIGH (ref 0–125)

## 2013-02-08 MED ORDER — ALBUTEROL SULFATE (5 MG/ML) 0.5% IN NEBU
2.5000 mg | INHALATION_SOLUTION | Freq: Four times a day (QID) | RESPIRATORY_TRACT | Status: DC
  Administered 2013-02-08 – 2013-02-10 (×6): 2.5 mg via RESPIRATORY_TRACT
  Filled 2013-02-08 (×7): qty 0.5

## 2013-02-08 MED ORDER — INSULIN ASPART 100 UNIT/ML ~~LOC~~ SOLN: 0.0000 [IU] | Freq: Every day | SUBCUTANEOUS | Status: DC

## 2013-02-08 MED ORDER — VANCOMYCIN HCL IN DEXTROSE 1-5 GM/200ML-% IV SOLN
1000.0000 mg | Freq: Once | INTRAVENOUS | Status: AC
  Administered 2013-02-08: 1000 mg via INTRAVENOUS
  Filled 2013-02-08: qty 200

## 2013-02-08 MED ORDER — DIGOXIN 125 MCG PO TABS
0.1250 mg | ORAL_TABLET | Freq: Every day | ORAL | Status: DC
  Administered 2013-02-09 – 2013-02-11 (×3): 0.125 mg via ORAL
  Filled 2013-02-08 (×3): qty 1

## 2013-02-08 MED ORDER — WARFARIN SODIUM 2 MG PO TABS: 2.0000 mg | ORAL_TABLET | Freq: Every day | ORAL | Status: DC

## 2013-02-08 MED ORDER — IPRATROPIUM BROMIDE 0.02 % IN SOLN
0.5000 mg | Freq: Four times a day (QID) | RESPIRATORY_TRACT | Status: DC
  Administered 2013-02-08 – 2013-02-10 (×6): 0.5 mg via RESPIRATORY_TRACT
  Filled 2013-02-08 (×7): qty 2.5

## 2013-02-08 MED ORDER — DILTIAZEM HCL 100 MG IV SOLR
5.0000 mg/h | INTRAVENOUS | Status: DC
  Filled 2013-02-08: qty 100

## 2013-02-08 MED ORDER — INSULIN ASPART 100 UNIT/ML ~~LOC~~ SOLN
0.0000 [IU] | Freq: Three times a day (TID) | SUBCUTANEOUS | Status: DC
  Administered 2013-02-09 (×2): 5 [IU] via SUBCUTANEOUS
  Administered 2013-02-10 (×2): 3 [IU] via SUBCUTANEOUS

## 2013-02-08 MED ORDER — PHYTONADIONE 5 MG PO TABS
5.0000 mg | ORAL_TABLET | Freq: Once | ORAL | Status: AC
  Administered 2013-02-08: 5 mg via ORAL
  Filled 2013-02-08: qty 1

## 2013-02-08 MED ORDER — FUROSEMIDE 10 MG/ML IJ SOLN
40.0000 mg | Freq: Two times a day (BID) | INTRAMUSCULAR | Status: DC
  Administered 2013-02-08 – 2013-02-10 (×4): 40 mg via INTRAVENOUS
  Filled 2013-02-08 (×4): qty 4

## 2013-02-08 MED ORDER — ONDANSETRON HCL 4 MG/2ML IJ SOLN: 4.0000 mg | Freq: Four times a day (QID) | INTRAMUSCULAR | Status: DC | PRN

## 2013-02-08 MED ORDER — SODIUM CHLORIDE 0.9 % IV SOLN: 250.0000 mL | INTRAVENOUS | Status: DC | PRN

## 2013-02-08 MED ORDER — ALBUTEROL SULFATE (5 MG/ML) 0.5% IN NEBU: 2.5000 mg | INHALATION_SOLUTION | RESPIRATORY_TRACT | Status: DC | PRN

## 2013-02-08 MED ORDER — DILTIAZEM HCL 100 MG IV SOLR
5.0000 mg/h | Freq: Once | INTRAVENOUS | Status: AC
  Administered 2013-02-08: 5 mg/h via INTRAVENOUS
  Filled 2013-02-08: qty 100

## 2013-02-08 MED ORDER — SODIUM CHLORIDE 0.9 % IJ SOLN
3.0000 mL | Freq: Two times a day (BID) | INTRAMUSCULAR | Status: DC
  Administered 2013-02-08 – 2013-02-11 (×5): 3 mL via INTRAVENOUS

## 2013-02-08 MED ORDER — MORPHINE SULFATE ER 15 MG PO TBCR
15.0000 mg | EXTENDED_RELEASE_TABLET | Freq: Two times a day (BID) | ORAL | Status: DC
  Administered 2013-02-08 – 2013-02-10 (×5): 15 mg via ORAL
  Filled 2013-02-08 (×5): qty 1

## 2013-02-08 MED ORDER — ACETAMINOPHEN 325 MG PO TABS: 650.0000 mg | ORAL_TABLET | Freq: Four times a day (QID) | ORAL | Status: DC | PRN

## 2013-02-08 MED ORDER — CLONAZEPAM 0.5 MG PO TABS
0.5000 mg | ORAL_TABLET | Freq: Three times a day (TID) | ORAL | Status: DC | PRN
  Administered 2013-02-09 – 2013-02-10 (×5): 0.5 mg via ORAL
  Filled 2013-02-08 (×5): qty 1

## 2013-02-08 MED ORDER — ACETAMINOPHEN 650 MG RE SUPP: 650.0000 mg | Freq: Four times a day (QID) | RECTAL | Status: DC | PRN

## 2013-02-08 MED ORDER — ONDANSETRON HCL 4 MG PO TABS: 4.0000 mg | ORAL_TABLET | Freq: Four times a day (QID) | ORAL | Status: DC | PRN

## 2013-02-08 MED ORDER — CHLORHEXIDINE GLUCONATE CLOTH 2 % EX PADS: 6.0000 | MEDICATED_PAD | Freq: Every day | CUTANEOUS | Status: DC

## 2013-02-08 MED ORDER — WARFARIN - PHARMACIST DOSING INPATIENT
Status: DC
  Administered 2013-02-09: 16:00:00

## 2013-02-08 MED ORDER — LISINOPRIL 10 MG PO TABS
40.0000 mg | ORAL_TABLET | Freq: Every day | ORAL | Status: DC
  Administered 2013-02-09: 40 mg via ORAL
  Filled 2013-02-08 (×2): qty 4

## 2013-02-08 MED ORDER — VANCOMYCIN HCL IN DEXTROSE 1-5 GM/200ML-% IV SOLN
1000.0000 mg | Freq: Two times a day (BID) | INTRAVENOUS | Status: DC
  Administered 2013-02-09 – 2013-02-11 (×5): 1000 mg via INTRAVENOUS
  Filled 2013-02-08 (×8): qty 200

## 2013-02-08 MED ORDER — HYDROCODONE-ACETAMINOPHEN 5-325 MG PO TABS
2.0000 | ORAL_TABLET | ORAL | Status: DC | PRN
  Administered 2013-02-09 – 2013-02-11 (×7): 2 via ORAL
  Filled 2013-02-08: qty 1
  Filled 2013-02-08 (×4): qty 2
  Filled 2013-02-08: qty 1
  Filled 2013-02-08 (×2): qty 2

## 2013-02-08 MED ORDER — LITHIUM CARBONATE 300 MG PO CAPS
300.0000 mg | ORAL_CAPSULE | Freq: Two times a day (BID) | ORAL | Status: DC
  Filled 2013-02-08 (×3): qty 1

## 2013-02-08 MED ORDER — PIPERACILLIN-TAZOBACTAM 3.375 G IVPB
3.3750 g | Freq: Three times a day (TID) | INTRAVENOUS | Status: DC
  Administered 2013-02-08 – 2013-02-11 (×9): 3.375 g via INTRAVENOUS
  Filled 2013-02-08 (×13): qty 50

## 2013-02-08 MED ORDER — WARFARIN - PHARMACIST DOSING INPATIENT: Freq: Every day | Status: DC

## 2013-02-08 MED ORDER — DILTIAZEM HCL 100 MG IV SOLR: 10.0000 mg/h | Freq: Once | INTRAVENOUS | Status: DC

## 2013-02-08 MED ORDER — DIGOXIN 0.25 MG/ML IJ SOLN
0.2500 mg | Freq: Once | INTRAMUSCULAR | Status: AC
  Administered 2013-02-08: 0.25 mg via INTRAVENOUS
  Filled 2013-02-08 (×2): qty 2

## 2013-02-08 MED ORDER — LITHIUM CARBONATE 300 MG PO CAPS
300.0000 mg | ORAL_CAPSULE | Freq: Two times a day (BID) | ORAL | Status: DC
  Administered 2013-02-08 – 2013-02-11 (×6): 300 mg via ORAL
  Filled 2013-02-08 (×10): qty 1

## 2013-02-08 MED ORDER — INSULIN GLARGINE 100 UNIT/ML ~~LOC~~ SOLN
25.0000 [IU] | Freq: Every day | SUBCUTANEOUS | Status: DC
  Administered 2013-02-08 – 2013-02-10 (×3): 25 [IU] via SUBCUTANEOUS
  Filled 2013-02-08 (×4): qty 0.25

## 2013-02-08 MED ORDER — PANTOPRAZOLE SODIUM 40 MG PO TBEC
40.0000 mg | DELAYED_RELEASE_TABLET | Freq: Every day | ORAL | Status: DC
  Administered 2013-02-08 – 2013-02-11 (×4): 40 mg via ORAL
  Filled 2013-02-08 (×4): qty 1

## 2013-02-08 MED ORDER — MUPIROCIN 2 % EX OINT
1.0000 | TOPICAL_OINTMENT | Freq: Two times a day (BID) | CUTANEOUS | Status: DC
Start: 2013-02-08 — End: 2013-02-11
  Administered 2013-02-08 – 2013-02-11 (×6): 1 via NASAL
  Filled 2013-02-08: qty 22

## 2013-02-08 MED ORDER — DIGOXIN 0.25 MG/ML IJ SOLN
0.5000 mg | Freq: Once | INTRAMUSCULAR | Status: AC
  Administered 2013-02-08: 0.5 mg via INTRAVENOUS
  Filled 2013-02-08: qty 2

## 2013-02-08 MED ORDER — SODIUM CHLORIDE 0.9 % IJ SOLN: 3.0000 mL | INTRAMUSCULAR | Status: DC | PRN

## 2013-02-08 NOTE — ED Notes (Signed)
Report given to Jessica, RN in ICU.

## 2013-02-08 NOTE — ED Notes (Signed)
Dr. Kerry Hough at bedside,

## 2013-02-08 NOTE — Progress Notes (Signed)
CRITICAL VALUE ALERT  Critical value received:  INR >10   Date of notification:  02/08/2013  Time of notification:  1430  Critical value read back:yes  Nurse who received alert:  Kathyrn Sheriff, RN  MD notified (1st page):  Memon  Time of first page:  1440  MD notified (2nd page):  Time of second page:  Responding MD:  Kerry Hough  Time MD responded:  1445

## 2013-02-08 NOTE — ED Provider Notes (Addendum)
CSN: [id number]     Arrival date & time 02/08/13  4098 History  This chart was scribed for Sean Hutching, MD by Bennett Scrape, ED Scribe. This patient was seen in room APA10/APA10 and the patient's care was started at 7:18 AM.   Chief Complaint  Patient presents with  . Recurrent Skin Infections    The history is provided by the patient. No language interpreter was used.    HPI Comments: Sean Warren is a 57 y.o. male with a h/o COPD who on home oxygen who presents to the Emergency Department complaining of gradual onset, gradually worsening cellulitis in his bilateral lower legs, the left worse than the right, with associated swelling. He was seen on 02/03/13 after being brought in by his caretaker for the same at that time stating that the symptoms have been going on for several days. He was borderline febrile at the time with symptoms that appeared to be cellulitis superimposed on pre-existing left lower extremity edema. He refused treatment at the time but was prescribed Keflex and a pain medication to take at home. He denies being on any antibiotics for his leg currently. He denies that he got the prescriptions filled stating that he lives alone and has no family help. However, he states that his neighbor plans on getting the prescriptions filled today. He denies any other symptoms currently.  He was admitted to [facility] on 11/29/12 for respiratory distress on was on life support. He was discharged on 12/05/12 to [facility] up until 3 days ago when he was discharged home. He states that he is getting PT to help with walking 3 times a week and he denies any ongoing respiratory symptoms since then. He is on Lasix 40 mg X4 daily for his lower extremity edema.   Past Medical History  Diagnosis Date  . Hypertension   . Diabetes mellitus   . Bipolar 1 disorder   . Hyperlipidemia   . Atrial fibrillation   . COPD (chronic obstructive pulmonary disease)   . Obstructive sleep apnea   .  Chronic diastolic heart failure     Echocardiogram 05/14/11: Mild LVH, EF 50-55%, no wall motion abnormalities, grade 2 diastolic dysfunction.  . Chest pain     Myoview 05/17/11 demonstrated an EF of 61%, small fixed inferoseptal defect consistent with thinning vs prior infarct; no ischemia. - low risk  . Diabetic neuropathy   . Chronic back pain   . History of stroke   . Mental disorder     bipolar  . CHF (congestive heart failure)   . Stroke   . Neuromuscular disorder     diabetic neuropathy  . Chronic respiratory failure with hypoxia 08/08/2012  . Noncompliance with medication regimen   . Tobacco abuse    Past Surgical History  Procedure Laterality Date  . No past surgeries     History reviewed. No pertinent family history. History  Substance Use Topics  . Smoking status: Current Every Day Smoker -- 1.50 packs/day for 29 years    Types: Cigarettes  . Smokeless tobacco: Never Used  . Alcohol Use: No    Review of Systems  A complete 10 system review of systems was obtained and all systems are negative except as noted in the HPI and PMH.   Allergies  Flexeril and Muscle relief  Home Medications   Current Outpatient Rx  Name  Route  Sig  Dispense  Refill  . albuterol (PROVENTIL) (5 MG/ML) 0.5% nebulizer solution  Nebulization   Take 0.5 mLs (2.5 mg total) by nebulization every 4 (four) hours.         Marland Kitchen antiseptic oral rinse (BIOTENE) LIQD   Mouth Rinse   15 mLs by Mouth Rinse route 2 times daily at 12 noon and 4 pm.         . bisacodyl (DULCOLAX) 10 MG suppository   Rectal   Place 1 suppository (10 mg total) rectally daily at 12 noon.         . cephALEXin (KEFLEX) 500 MG capsule   Oral   Take 1 capsule (500 mg total) by mouth 4 (four) times daily.   40 capsule   0   . chlorhexidine (PERIDEX) 0.12 % solution   Mouth/Throat   Use as directed 15 mLs in the mouth or throat 2 (two) times daily.         . clonazePAM (KLONOPIN) 0.5 MG tablet   Oral    Take 1 tablet by mouth 3 (three) times daily as needed. For anxiety         . dexamethasone (DECADRON) 1 MG tablet   Oral   Take 1 mg by mouth daily.         . diazepam (VALIUM) 10 MG tablet   Oral   Take 1 tablet by mouth daily as needed.         . diltiazem (CARDIZEM) 25 MG/5ML SOLN injection   Intravenous   Inject 3 mLs (15 mg total) into the vein every 6 (six) hours.         . fentaNYL (SUBLIMAZE) SOLN   Intravenous   Inject 25-100 mcg into the vein every 6 (six) hours as needed (to achieve analgesia).         . furosemide (LASIX) 20 MG tablet   Oral   Take 1 tablet (20 mg total) by mouth 2 (two) times daily.         Marland Kitchen glimepiride (AMARYL) 2 MG tablet   Oral   Take 2 mg by mouth daily.         . heparin 100-0.45 UNIT/ML-% infusion   Intravenous   Inject 1,450 Units/hr into the vein continuous.         Marland Kitchen HYDROcodone-acetaminophen (NORCO) 5-325 MG per tablet   Oral   Take 2 tablets by mouth every 4 (four) hours as needed.   15 tablet   0   . HYDROcodone-acetaminophen (NORCO) 7.5-325 MG per tablet   Oral   Take 1 tablet by mouth 3 (three) times daily as needed. For pain         . insulin aspart (NOVOLOG) 100 UNIT/ML injection   Subcutaneous   Inject 0-20 Units into the skin every 4 (four) hours.         . insulin aspart (NOVOLOG) 100 UNIT/ML injection   Subcutaneous   Inject 3 Units into the skin every 4 (four) hours.         . insulin glargine (LANTUS) 100 UNIT/ML injection   Subcutaneous   Inject 0.25 mLs (25 Units total) into the skin at bedtime.         Marland Kitchen ipratropium (ATROVENT) 0.02 % nebulizer solution   Nebulization   Take 2.5 mLs (0.5 mg total) by nebulization every 4 (four) hours.         Marland Kitchen LEVEMIR 100 UNIT/ML injection               . levofloxacin (LEVAQUIN) 750 MG/150ML SOLN   Intravenous  Inject 150 mLs (750 mg total) into the vein daily.         Marland Kitchen lisinopril (PRINIVIL,ZESTRIL) 40 MG tablet   Oral   Take 40  mg by mouth daily.         Marland Kitchen lithium carbonate 300 MG capsule   Oral   Take 600 mg by mouth daily.         Marland Kitchen LORazepam (ATIVAN) 2 MG/ML injection   Intravenous   Inject 0.5 mLs (1 mg total) into the vein every 2 (two) hours as needed for anxiety.         . methylPREDNISolone sodium succinate (SOLU-MEDROL) 125 mg/2 mL injection   Intravenous   Inject 0.96 mLs (60 mg total) into the vein daily.         Marland Kitchen morphine (MS CONTIN) 15 MG 12 hr tablet   Oral   Take 15 mg by mouth 2 (two) times daily.         . Nutritional Supplements (FEEDING SUPPLEMENT, OXEPA,) LIQD   Per Tube   Place 1,000 mLs into feeding tube daily.         . pantoprazole (PROTONIX) 40 MG injection   Intravenous   Inject 40 mg into the vein daily.         . Potassium Chloride in Dextrose (DEXTROSE 5 % WITH KCL 20 MEQ / L) 20-5 MEQ/L-%   Intravenous   Inject 1,000 mLs (20 mEq total) into the vein continuous.         . promethazine (PHENERGAN) 12.5 MG tablet   Oral   Take 1 tablet by mouth daily as needed. For nausea and/or vomiting         . propofol (DIPRIVAN) 10 mg/ml injection   Intravenous   Inject 610.5-6,105 mcg/min into the vein continuous.   20 mL      . simvastatin (ZOCOR) 80 MG tablet   Oral   Take 80 mg by mouth daily.         . sodium chloride 0.9 % SOLN 200 mL with fentaNYL 0.05 MG/ML SOLN 2,500 mcg   Intravenous   Inject 25 mcg/hr into the vein continuous.         . traZODone (DESYREL) 100 MG tablet   Oral   Take 100 mg by mouth at bedtime.         Marland Kitchen warfarin (COUMADIN) 4 MG tablet   Oral   Take 4 mg by mouth as directed.          Triage Vitals: BP 110/87  Pulse 79  Temp(Src) 98 F (36.7 C) (Oral)  Resp 19  Wt 273 lb (123.832 kg)  SpO2 98%  Physical Exam  Nursing note and vitals reviewed. Constitutional: He is oriented to person, place, and time. He appears well-developed and well-nourished.  Obese   HENT:  Head: Normocephalic and atraumatic.   Eyes: Conjunctivae and EOM are normal. Pupils are equal, round, and reactive to light.  Neck: Normal range of motion. Neck supple.  Cardiovascular: Normal rate, regular rhythm and normal heart sounds.   Pulmonary/Chest: Effort normal and breath sounds normal.  Abdominal: Soft. Bowel sounds are normal.  Musculoskeletal: Normal range of motion.  Neurological: He is alert and oriented to person, place, and time.  Skin: Skin is warm. There is erythema.  Bilateral leg swelling and erythema with the left worse than the right. The left leg has an area of erythema with blistering and weeping from the knee to the foot. The right  has erythema from the mid tibia to ankle.  Psychiatric: He has a normal mood and affect. His behavior is normal.    ED Course  Procedures (including critical care time)  DIAGNOSTIC STUDIES: Oxygen Saturation is 98% on room air, normal by my interpretation.    COORDINATION OF CARE: 7:22 AM-Discussed treatment plan which includes CBC panel and BMP with pt at bedside and pt agreed to plan.   Labs Review Labs Reviewed  BASIC METABOLIC PANEL - Abnormal; Notable for the following:    CO2 34 (*)    Glucose, Bld 114 (*)    GFR calc non Af Amer 59 (*)    GFR calc Af Amer 69 (*)    All other components within normal limits  CBC WITH DIFFERENTIAL - Abnormal; Notable for the following:    WBC 14.0 (*)    RBC 3.39 (*)    Hemoglobin 10.9 (*)    HCT 34.6 (*)    MCV 102.1 (*)    RDW 16.3 (*)    Neutrophils Relative % 80 (*)    Neutro Abs 11.2 (*)    All other components within normal limits   Imaging Review No results found.  EKG Interpretation   None      Date: 02/08/2013  Rate: 114  Rhythm: atrial fib  QRS Axis: normal  Intervals: normal  ST/T Wave abnormalities: normal  Conduction Disutrbances: inc RBBB  Narrative Interpretation: unremarkable CRITICAL CARE Performed by: Sean Warren Total critical care time: 30 Critical care time was exclusive of  separately billable procedures and treating other patients. Critical care was necessary to treat or prevent imminent or life-threatening deterioration. Critical care was time spent personally by me on the following activities: development of treatment plan with patient and/or surrogate as well as nursing, discussions with consultants, evaluation of patient's response to treatment, examination of patient, obtaining history from patient or surrogate, ordering and performing treatments and interventions, ordering and review of laboratory studies, ordering and review of radiographic studies, pulse oximetry and re-evaluation of patient's condition.    MDM  No diagnosis found. Patient has bilateral lower extremity venous stasis ulcer with superimposed cellulitis.   Rx IV vancomycin.   Also EKG shows atrial fibrillation rate of 125. IV Cardizem.  Admit to general medicine. Discussed with Dr. Kerry Hough     I personally performed the services described in this documentation, which was scribed in my presence. The recorded information has been reviewed and is accurate.   Sean Hutching, MD 02/08/13 1011  Sean Hutching, MD 02/10/13 2105

## 2013-02-08 NOTE — ED Notes (Signed)
Pt was seen here on the Nov.18th for treatment for cellulitis. Pt states he is bi-polar and was mad and left. Pt has bilateral leg swelling and redness.

## 2013-02-08 NOTE — Progress Notes (Addendum)
ANTIBIOTIC CONSULT NOTE - INITIAL  Pharmacy Consult for Vancomycin/Zosyn Indication: Cellulitis  Allergies  Allergen Reactions  . Flexeril [Cyclobenzaprine] Other (See Comments)    Alters Mental Status-ALL MUSCLE RELAXERS  . Muscle Relief [Capsaicin]     Hysteria     Patient Measurements: Height: 5\' 9"  (175.3 cm) Weight: 257 lb 15 oz (117 kg) IBW/kg (Calculated) : 70.7  Vital Signs: Temp: 97.6 F (36.4 C) (11/23 1128) Temp src: Oral (11/23 1128) BP: 100/45 mmHg (11/23 1128) Pulse Rate: 104 (11/23 1128) Intake/Output from previous day:   Intake/Output from this shift:    Labs:  Recent Labs  02/08/13 0757  WBC 14.0*  HGB 10.9*  PLT 275  CREATININE 1.29   Estimated Creatinine Clearance: 77.8 ml/min (by C-G formula based on Cr of 1.29). No results found for this basename: VANCOTROUGH, VANCOPEAK, VANCORANDOM, GENTTROUGH, GENTPEAK, GENTRANDOM, TOBRATROUGH, TOBRAPEAK, TOBRARND, AMIKACINPEAK, AMIKACINTROU, AMIKACIN,  in the last 72 hours   Microbiology: No results found for this or any previous visit (from the past 720 hour(s)).  Medical History: Past Medical History  Diagnosis Date  . Hypertension   . Diabetes mellitus   . Bipolar 1 disorder   . Hyperlipidemia   . Atrial fibrillation   . COPD (chronic obstructive pulmonary disease)   . Obstructive sleep apnea   . Chronic diastolic heart failure     Echocardiogram 05/14/11: Mild LVH, EF 50-55%, no wall motion abnormalities, grade 2 diastolic dysfunction.  . Chest pain     Myoview 05/17/11 demonstrated an EF of 61%, small fixed inferoseptal defect consistent with thinning vs prior infarct; no ischemia. - low risk  . Diabetic neuropathy   . Chronic back pain   . History of stroke   . Mental disorder     bipolar  . CHF (congestive heart failure)   . Stroke   . Neuromuscular disorder     diabetic neuropathy  . Chronic respiratory failure with hypoxia 08/08/2012  . Noncompliance with medication regimen   .  Tobacco abuse     Medications:  Prescriptions prior to admission  Medication Sig Dispense Refill  . albuterol (PROVENTIL) (5 MG/ML) 0.5% nebulizer solution Take 2.5 mg by nebulization 2 (two) times daily.      . clonazePAM (KLONOPIN) 0.5 MG tablet Take 1 tablet by mouth 3 (three) times daily as needed. For anxiety      . diazepam (VALIUM) 10 MG tablet Take 1 tablet by mouth daily as needed for anxiety.       . furosemide (LASIX) 20 MG tablet Take 1 tablet (20 mg total) by mouth 2 (two) times daily.      Marland Kitchen glimepiride (AMARYL) 2 MG tablet Take 2 mg by mouth daily.      Marland Kitchen HYDROcodone-acetaminophen (NORCO) 5-325 MG per tablet Take 2 tablets by mouth every 4 (four) hours as needed.  15 tablet  0  . insulin aspart (NOVOLOG) 100 UNIT/ML injection Inject 0-20 Units into the skin as needed for high blood sugar (Has a sliding scale at home).      . insulin glargine (LANTUS) 100 UNIT/ML injection Inject 0.25 mLs (25 Units total) into the skin at bedtime.      Marland Kitchen lisinopril (PRINIVIL,ZESTRIL) 40 MG tablet Take 40 mg by mouth daily.      Marland Kitchen lithium carbonate 300 MG capsule Take 600 mg by mouth daily.      Marland Kitchen morphine (MS CONTIN) 15 MG 12 hr tablet Take 15 mg by mouth 2 (two) times daily.      Marland Kitchen  omeprazole (PRILOSEC) 20 MG capsule Take 20 mg by mouth daily.      . potassium chloride (K-DUR) 10 MEQ tablet Take 10 mEq by mouth daily.      Marland Kitchen warfarin (COUMADIN) 4 MG tablet Take 2-4 mg by mouth daily. Alternates between taking a whole tablet and a half tablet daily.       Assessment: Okay for Protocol, obesity/normalized CrCl Dosing. Estimated normalized CrCl = 62 ml/min  Vancomycin 11/23 >> Zosyn 11/23 >>  Goal of Therapy:  Vancomycin trough level 10-15 mcg/ml  Plan:  Vancomycin 2gm loading dose, followed by 1gm IV every 12 hours. Measure antibiotic drug levels at steady state Follow up culture results Zosyn 3.375gm IV every 8 hours. Follow-up micro data, labs, vitals.  Lamonte Richer  R 02/08/2013,11:37 AM

## 2013-02-08 NOTE — ED Notes (Addendum)
Assisted pt to use the urinal, wet depends removed from pt, pt cleaned, placed on new "chucks", states that he was not able to get the antibiotics filled that he was given recently but would be able to today, pt has weeping of yellow fluid, blisters, redness, swelling, warm to touch of lower extremities, redness radiating up left thigh area. Pt on monitor with a-fib with rate mid 120's noted, has hx of same,

## 2013-02-08 NOTE — Progress Notes (Deleted)
[facility] History and Physical  KRISTIAN MOGG ZOX:[id number] DOB: [birth year] DOA: 02/08/2013  Referring physician: Dr. Adriana Simas, ER physician PCP: [name], MD  Specialists:   Chief Complaint: Lower extremity edema  HPI: Sean Warren is a 57 y.o. male who has multiple medical problems and appears older than stated age. The patient was recently in hospital for respiratory failure required intubation and mechanical ventilation. He does be a difficult wean and therefore was transferred to long-term acute care facility to further manage his ventilator issues. The patient subsequently liberated from the ventilator and discharged from [facility] and transferred to [facility] nursing home. After an extended period there, he was discharged home. The patient reports that for the past several weeks he has noticed erythema in his lower extremities. He's also developed worsening edema in the lower extremities as well. His edema is associated with blistering and weeping lesions. He denies any significant shortness of breath, change in cough, chest pain. He has felt feverish. No vomiting, but he has had occasional loose stools. The patient was evaluated in the emergency room and felt to have a significant cellulitis as well as lower extremity edema requiring inpatient management. He was also found to have rapid atrial fibrillation.  Review of Systems: Pertinent positives as per history of present illness, otherwise negative  Past Medical History  Diagnosis Date  . Hypertension   . Diabetes mellitus   . Bipolar 1 disorder   . Hyperlipidemia   . Atrial fibrillation   . COPD (chronic obstructive pulmonary disease)   . Obstructive sleep apnea   . Chronic diastolic heart failure     Echocardiogram 05/14/11: Mild LVH, EF 50-55%, no wall motion abnormalities, grade 2 diastolic dysfunction.  . Chest pain     Myoview 05/17/11 demonstrated an EF of 61%, small fixed inferoseptal defect  consistent with thinning vs prior infarct; no ischemia. - low risk  . Diabetic neuropathy   . Chronic back pain   . History of stroke   . Mental disorder     bipolar  . CHF (congestive heart failure)   . Stroke   . Neuromuscular disorder     diabetic neuropathy  . Chronic respiratory failure with hypoxia 08/08/2012  . Noncompliance with medication regimen   . Tobacco abuse    Past Surgical History  Procedure Laterality Date  . No past surgeries     Social History:  reports that he has been smoking Cigarettes.  He has a 43.5 pack-year smoking history. He has never used smokeless tobacco. He reports that he does not drink alcohol or use illicit drugs.   Allergies  Allergen Reactions  . Flexeril [Cyclobenzaprine] Other (See Comments)    Alters Mental Status-ALL MUSCLE RELAXERS  . Muscle Relief [Capsaicin]     Hysteria     Family history: Positive for heart disease and hypertension   Prior to Admission medications   Medication Sig Start Date End Date Taking? Authorizing Provider  albuterol (PROVENTIL) (5 MG/ML) 0.5% nebulizer solution Take 2.5 mg by nebulization 2 (two) times daily. 12/05/12  Yes Elliot Cousin, MD  clonazePAM (KLONOPIN) 0.5 MG tablet Take 1 tablet by mouth 3 (three) times daily as needed. For anxiety 01/29/13  Yes Historical Provider, MD  diazepam (VALIUM) 10 MG tablet Take 1 tablet by mouth daily as needed for anxiety.  11/24/12  Yes Historical Provider, MD  furosemide (LASIX) 20 MG tablet Take 1 tablet (20 mg total) by mouth 2 (two) times daily. 12/05/12  Yes Angelique Blonder  Fisher, MD  glimepiride (AMARYL) 2 MG tablet Take 2 mg by mouth daily. 11/12/12  Yes Historical Provider, MD  HYDROcodone-acetaminophen (NORCO) 5-325 MG per tablet Take 2 tablets by mouth every 4 (four) hours as needed. 02/03/13  Yes Geoffery Lyons, MD  insulin aspart (NOVOLOG) 100 UNIT/ML injection Inject 0-20 Units into the skin as needed for high blood sugar (Has a sliding scale at home). 12/05/12  Yes  Elliot Cousin, MD  insulin glargine (LANTUS) 100 UNIT/ML injection Inject 0.25 mLs (25 Units total) into the skin at bedtime. 12/05/12  Yes Elliot Cousin, MD  lisinopril (PRINIVIL,ZESTRIL) 40 MG tablet Take 40 mg by mouth daily. 11/12/12  Yes Historical Provider, MD  lithium carbonate 300 MG capsule Take 600 mg by mouth daily. 01/29/13  Yes Historical Provider, MD  morphine (MS CONTIN) 15 MG 12 hr tablet Take 15 mg by mouth 2 (two) times daily. 01/28/13  Yes Historical Provider, MD  omeprazole (PRILOSEC) 20 MG capsule Take 20 mg by mouth daily.   Yes Historical Provider, MD  potassium chloride (K-DUR) 10 MEQ tablet Take 10 mEq by mouth daily.   Yes Historical Provider, MD  warfarin (COUMADIN) 4 MG tablet Take 2-4 mg by mouth daily. Alternates between taking a whole tablet and a half tablet daily. 02/02/13  Yes Historical Provider, MD   Physical Exam: Filed Vitals:   02/08/13 1128  BP: 100/45  Pulse: 104  Temp: 97.6 F (36.4 C)  Resp:      General:  NAD, morbidly obese  Eyes: PERRLA  ENT: mucous membranes are moist  Neck: supple  Cardiovascular: s1, s2, irregular  Respiratory: cta b, limited exam due to body habitus  Abdomen: obese, nt, nd, bs+  Skin: diffuse erythema over bilateral lower extremities with weeping lesions, warm  Musculoskeletal: 3+ pitting edema bilaterally  Psychiatric: normal affect, cooperative with exam  Neurologic: grossly intact, nonfocal  Labs on Admission:  Basic Metabolic Panel:  Recent Labs Lab 02/08/13 0757  NA 138  K 3.6  CL 98  CO2 34*  GLUCOSE 114*  BUN 22  CREATININE 1.29  CALCIUM 8.4   Liver Function Tests: No results found for this basename: AST, ALT, ALKPHOS, BILITOT, PROT, ALBUMIN,  in the last 168 hours No results found for this basename: LIPASE, AMYLASE,  in the last 168 hours No results found for this basename: AMMONIA,  in the last 168 hours CBC:  Recent Labs Lab 02/08/13 0757  WBC 14.0*  NEUTROABS 11.2*  HGB  10.9*  HCT 34.6*  MCV 102.1*  PLT 275   Cardiac Enzymes: No results found for this basename: CKTOTAL, CKMB, CKMBINDEX, TROPONINI,  in the last 168 hours  BNP (last 3 results)  Recent Labs  10/31/12 1355 11/17/12 2032 11/27/12 0924  PROBNP 1622.0* 1300.0* 2577.0*   CBG: No results found for this basename: GLUCAP,  in the last 168 hours  Radiological Exams on Admission: No results found.  EKG: Independently reviewed. Rapid atrial fib  Assessment/Plan Principal Problem:   Cellulitis of left lower extremity Active Problems:   Bipolar disorder   Obstructive sleep apnea   DM (diabetes mellitus)   HTN (hypertension)   Chronic respiratory failure with hypoxia   Acute on chronic diastolic CHF (congestive heart failure)   Atrial fibrillation with RVR   1. Cellulitis of left lower extremity. Likely has some underlying venous stasis as well. We'll start the patient on vancomycin and Zosyn. Keep both extremities elevated. 2. Acute on chronic diastolic congestive heart failure lower extremity  edema. Start the patient on IV Lasix. Monitor strict I.'s and O.'s. Ejection fraction was done earlier this year and was a poor study due to body habitus. This does not need to be repeated at this time. Check BMP. 3. Atrial fibrillation with a ventricular response. Start on a Cardizem infusion having difficulty tolerating this due to hypotension. He's not a good candidate for amiodarone due to his underlying lung disease. We'll give a trial of digoxin. 4. Diabetes. Continue Lantus and start sliding scale insulin. 5. Chronic respiratory failure with hypoxia. Patient feels that his breathing is currently at baseline. We will continue to monitor. 6. Bipolar disorder. Continue outpatient dose of lithium.  Code Status: full code Family Communication: discussed with patient Disposition Plan: pending hospital course, may need placement  Time spent:  MEMON,JEHANZEB [facility] Pager  [phone]  If 7PM-7AM, please contact night-coverage [email or web address] Password [facility] 02/08/2013, 11:42 AM

## 2013-02-08 NOTE — ED Notes (Addendum)
While enroute to floor, bp 86/49, hr 110 a-fib, cardizem drip paused receiving RN notified,

## 2013-02-08 NOTE — Progress Notes (Signed)
ANTICOAGULATION CONSULT NOTE  Pharmacy Consult for Warfarin Indication: atrial fibrillation  Allergies  Allergen Reactions  . Flexeril [Cyclobenzaprine] Other (See Comments)    Alters Mental Status-ALL MUSCLE RELAXERS  . Muscle Relief [Capsaicin]     Hysteria     Patient Measurements: Height: 5\' 9"  (175.3 cm) Weight: 257 lb 15 oz (117 kg) IBW/kg (Calculated) : 70.7  Vital Signs: Temp: 97.6 F (36.4 C) (11/23 1128) Temp src: Oral (11/23 1128) BP: 100/45 mmHg (11/23 1128) Pulse Rate: 104 (11/23 1128)  Labs:  Recent Labs  02/08/13 0757  HGB 10.9*  HCT 34.6*  PLT 275  CREATININE 1.29    Estimated Creatinine Clearance: 77.8 ml/min (by C-G formula based on Cr of 1.29).   Medical History: Past Medical History  Diagnosis Date  . Hypertension   . Diabetes mellitus   . Bipolar 1 disorder   . Hyperlipidemia   . Atrial fibrillation   . COPD (chronic obstructive pulmonary disease)   . Obstructive sleep apnea   . Chronic diastolic heart failure     Echocardiogram 05/14/11: Mild LVH, EF 50-55%, no wall motion abnormalities, grade 2 diastolic dysfunction.  . Chest pain     Myoview 05/17/11 demonstrated an EF of 61%, small fixed inferoseptal defect consistent with thinning vs prior infarct; no ischemia. - low risk  . Diabetic neuropathy   . Chronic back pain   . History of stroke   . Mental disorder     bipolar  . CHF (congestive heart failure)   . Stroke   . Neuromuscular disorder     diabetic neuropathy  . Chronic respiratory failure with hypoxia 08/08/2012  . Noncompliance with medication regimen   . Tobacco abuse     Medications:  Prescriptions prior to admission  Medication Sig Dispense Refill  . albuterol (PROVENTIL) (5 MG/ML) 0.5% nebulizer solution Take 2.5 mg by nebulization 2 (two) times daily.      . clonazePAM (KLONOPIN) 0.5 MG tablet Take 1 tablet by mouth 3 (three) times daily as needed. For anxiety      . diazepam (VALIUM) 10 MG tablet Take 1 tablet  by mouth daily as needed for anxiety.       . furosemide (LASIX) 20 MG tablet Take 1 tablet (20 mg total) by mouth 2 (two) times daily.      Marland Kitchen glimepiride (AMARYL) 2 MG tablet Take 2 mg by mouth daily.      Marland Kitchen HYDROcodone-acetaminophen (NORCO) 5-325 MG per tablet Take 2 tablets by mouth every 4 (four) hours as needed.  15 tablet  0  . insulin aspart (NOVOLOG) 100 UNIT/ML injection Inject 0-20 Units into the skin as needed for high blood sugar (Has a sliding scale at home).      . insulin glargine (LANTUS) 100 UNIT/ML injection Inject 0.25 mLs (25 Units total) into the skin at bedtime.      Marland Kitchen lisinopril (PRINIVIL,ZESTRIL) 40 MG tablet Take 40 mg by mouth daily.      Marland Kitchen lithium carbonate 300 MG capsule Take 300 mg by mouth 2 (two) times daily with a meal.      . morphine (MS CONTIN) 15 MG 12 hr tablet Take 15 mg by mouth 2 (two) times daily.      Marland Kitchen omeprazole (PRILOSEC) 20 MG capsule Take 20 mg by mouth daily.      . potassium chloride (K-DUR) 10 MEQ tablet Take 10 mEq by mouth daily.      Marland Kitchen warfarin (COUMADIN) 4 MG tablet Take 2-4  mg by mouth daily. Alternates between taking a whole tablet and a half tablet daily.        Assessment: Okay for Protocol, Elevated INR.  MD has ordered Vit.K.  Goal of Therapy:  INR 2-3   Plan:  No warfarin today. Daily PT/INR.  Lamonte Richer R 02/08/2013,12:29 PM

## 2013-02-09 LAB — GLUCOSE, CAPILLARY
Glucose-Capillary: 223 mg/dL — ABNORMAL HIGH (ref 70–99)
Glucose-Capillary: 109 mg/dL — ABNORMAL HIGH (ref 70–99)

## 2013-02-09 LAB — CBC
MCH: 31.2 pg (ref 26.0–34.0)
MCV: 104.5 fL — ABNORMAL HIGH (ref 78.0–100.0)
Platelets: 349 10*3/uL (ref 150–400)
RDW: 16.3 % — ABNORMAL HIGH (ref 11.5–15.5)
WBC: 12.9 10*3/uL — ABNORMAL HIGH (ref 4.0–10.5)

## 2013-02-09 LAB — PROTIME-INR
INR: 3.06 — ABNORMAL HIGH (ref 0.00–1.49)
Prothrombin Time: 30.5 seconds — ABNORMAL HIGH (ref 11.6–15.2)

## 2013-02-09 LAB — BASIC METABOLIC PANEL
BUN: 15 mg/dL (ref 6–23)
Calcium: 8.1 mg/dL — ABNORMAL LOW (ref 8.4–10.5)
Chloride: 102 mEq/L (ref 96–112)
Creatinine, Ser: 1.18 mg/dL (ref 0.50–1.35)
GFR calc Af Amer: 76 mL/min — ABNORMAL LOW (ref >90)
GFR calc non Af Amer: 66 mL/min — ABNORMAL LOW (ref >90)

## 2013-02-09 MED ORDER — METOPROLOL TARTRATE 25 MG PO TABS
25.0000 mg | ORAL_TABLET | Freq: Two times a day (BID) | ORAL | Status: DC
  Administered 2013-02-09 – 2013-02-11 (×3): 25 mg via ORAL
  Filled 2013-02-09 (×5): qty 1

## 2013-02-09 MED ORDER — CHLORHEXIDINE GLUCONATE CLOTH 2 % EX PADS
6.0000 | MEDICATED_PAD | Freq: Every day | CUTANEOUS | Status: DC
  Administered 2013-02-09 – 2013-02-10 (×2): 6 via TOPICAL

## 2013-02-09 MED ORDER — WARFARIN SODIUM 1 MG PO TABS
0.5000 mg | ORAL_TABLET | Freq: Once | ORAL | Status: AC
  Administered 2013-02-09: 0.5 mg via ORAL
  Filled 2013-02-09: qty 1

## 2013-02-09 NOTE — Progress Notes (Signed)
ANTICOAGULATION CONSULT NOTE  Pharmacy Consult for Warfarin Indication: atrial fibrillation  Allergies  Allergen Reactions  . Flexeril [Cyclobenzaprine] Other (See Comments)    Alters Mental Status-ALL MUSCLE RELAXERS  . Muscle Relief [Capsaicin]     Hysteria    Patient Measurements: Height: 5\' 9"  (175.3 cm) Weight: 267 lb 13.7 oz (121.5 kg) IBW/kg (Calculated) : 70.7  Vital Signs: Temp: 98.5 F (36.9 C) (11/24 1154) Temp src: Oral (11/24 1154) BP: 105/51 mmHg (11/24 1159) Pulse Rate: 112 (11/24 1159)  Labs:  Recent Labs  02/08/13 0757 02/08/13 1154 02/09/13 0454  HGB 10.9*  --  10.5*  HCT 34.6*  --  35.2*  PLT 275  --  349  LABPROT  --  76.8* 30.5*  INR  --  >10.01* 3.06*  CREATININE 1.29  --  1.18   Estimated Creatinine Clearance: 86.8 ml/min (by C-G formula based on Cr of 1.18).  Medical History: Past Medical History  Diagnosis Date  . Hypertension   . Diabetes mellitus   . Bipolar 1 disorder   . Hyperlipidemia   . Atrial fibrillation   . COPD (chronic obstructive pulmonary disease)   . Obstructive sleep apnea   . Chronic diastolic heart failure     Echocardiogram 05/14/11: Mild LVH, EF 50-55%, no wall motion abnormalities, grade 2 diastolic dysfunction.  . Chest pain     Myoview 05/17/11 demonstrated an EF of 61%, small fixed inferoseptal defect consistent with thinning vs prior infarct; no ischemia. - low risk  . Diabetic neuropathy   . Chronic back pain   . History of stroke   . Mental disorder     bipolar  . CHF (congestive heart failure)   . Stroke   . Neuromuscular disorder     diabetic neuropathy  . Chronic respiratory failure with hypoxia 08/08/2012  . Noncompliance with medication regimen   . Tobacco abuse    Medications:  Prescriptions prior to admission  Medication Sig Dispense Refill  . albuterol (PROVENTIL) (5 MG/ML) 0.5% nebulizer solution Take 2.5 mg by nebulization 2 (two) times daily.      . clonazePAM (KLONOPIN) 0.5 MG tablet  Take 1 tablet by mouth 3 (three) times daily as needed. For anxiety      . diazepam (VALIUM) 10 MG tablet Take 1 tablet by mouth daily as needed for anxiety.       . furosemide (LASIX) 20 MG tablet Take 1 tablet (20 mg total) by mouth 2 (two) times daily.      Marland Kitchen glimepiride (AMARYL) 2 MG tablet Take 2 mg by mouth daily.      Marland Kitchen HYDROcodone-acetaminophen (NORCO) 5-325 MG per tablet Take 2 tablets by mouth every 4 (four) hours as needed.  15 tablet  0  . insulin aspart (NOVOLOG) 100 UNIT/ML injection Inject 0-20 Units into the skin as needed for high blood sugar (Has a sliding scale at home).      . insulin glargine (LANTUS) 100 UNIT/ML injection Inject 0.25 mLs (25 Units total) into the skin at bedtime.      Marland Kitchen lisinopril (PRINIVIL,ZESTRIL) 40 MG tablet Take 40 mg by mouth daily.      Marland Kitchen lithium carbonate 300 MG capsule Take 300 mg by mouth 2 (two) times daily with a meal.      . morphine (MS CONTIN) 15 MG 12 hr tablet Take 15 mg by mouth 2 (two) times daily.      Marland Kitchen omeprazole (PRILOSEC) 20 MG capsule Take 20 mg by mouth daily.      Marland Kitchen  potassium chloride (K-DUR) 10 MEQ tablet Take 10 mEq by mouth daily.      Marland Kitchen warfarin (COUMADIN) 4 MG tablet Take 2-4 mg by mouth daily. Alternates between taking a whole tablet and a half tablet daily.       Assessment: Okay for Protocol, Elevated INR (> 10) on admission and Vitamin K 5mg  PO was given.  Today INR is 3.  Given pt's age and size he is on a relatively small dose of Warfarin at home.  CBC is OK, no reported bleeding.  Pt is having some incontinence.  Anticipate INR response will be sluggish and erratic after Vitamin K administration.  Goal of Therapy:  INR 2-3   Plan:  Coumadin 0.5mg  (1/2mg ) today x 1 dose. Daily PT/INR.  Valrie Hart A 02/09/2013,12:11 PM

## 2013-02-09 NOTE — Clinical Social Work Placement (Signed)
    Clinical Social Work Department CLINICAL SOCIAL WORK PLACEMENT NOTE 02/09/2013  Patient:  Sean Warren, Sean Warren  Account Number:  [id number] Admit date:  02/08/2013  Clinical Social Worker:  Santa Genera, CLINICAL SOCIAL WORKER  Date/time:  02/09/2013 10:30 AM  Clinical Social Work is seeking post-discharge placement for this patient at the following level of care:   SKILLED NURSING   (*CSW will update this form in Epic as items are completed)   02/09/2013  Patient/family provided with [facility] Department of Clinical Social Work's list of facilities offering this level of care within the geographic area requested by the patient (or if unable, by the patient's family).  02/09/2013  Patient/family informed of their freedom to choose among providers that offer the needed level of care, that participate in Medicare, Medicaid or managed care program needed by the patient, have an available bed and are willing to accept the patient.  02/09/2013  Patient/family informed of [facility]' ownership interest in [facility], as well as of the fact that they are under no obligation to receive care at this facility.  PASARR submitted to EDS on 02/09/2013 PASARR number received from [employer] on 02/09/2013  FL2 transmitted to all facilities in geographic area requested by pt/family on  02/09/2013 FL2 transmitted to all facilities within larger geographic area on   Patient informed that his/her managed care company has contracts with or will negotiate with  certain facilities, including the following:     Patient/family informed of bed offers received:   Patient chooses bed at  Physician recommends and patient chooses bed at    Patient to be transferred to  on   Patient to be transferred to facility by   The following physician request were entered in Epic:   Additional Comments: Patient had preexisitng F Pasarr, expiring 03-01-13.  Santa Genera, LCSW Clinical Social Worker  [phone])

## 2013-02-09 NOTE — Progress Notes (Signed)
Foley catheter placed due to excessive drainage from weeping wounds and large amounts of incontinence, stretching down to wounds.  Occurred multiple times tonight.  Patient becomes difficult to calm during turning and hygiene care.

## 2013-02-09 NOTE — Progress Notes (Signed)
[facility] PROGRESS NOTE  Sean Warren ZOX:[id number] DOB: Sep 26, 1955 DOA: 02/08/2013 PCP: [name], MD  Assessment/Plan: 1. Cellulitis of left lower extremity. Likely also has some underlying venous stasis in bilateral lower charities. Continue vancomycin and Zosyn. Keep both extremities elevated. 2. Acute on chronic diastolic congestive heart failure. BNP is elevated. Urine output has been good with IV Lasix. Continue to monitor ins and outs. 3. Diabetes. Continue Lantus insulin insulin sliding. 4. Chronic respiratory failure. Appears to be at baseline. Continue supportive oxygen and monitor. 5. Atrial fibrillation with rapid ventricular response. Patient did not tolerate Cardizem infusion due to hypotension. He was started on digoxin yesterday. Heart rate appears to be improved but still somewhat tachycardic range between 100-110. Will add low-dose Lopressor. He is anticoagulated with Coumadin 6. Bipolar disorder. Continue lithium.  Code Status: full code Family Communication: discussed with patient Disposition Plan: will likely need SNF on discharge, possible transfer to telemetry later today   Consultants:  none  Procedures:  none  Antibiotics:  Vancomycin 11/23  Zosyn 11/23  HPI/Subjective: Feels the same, no new complaints  Objective: Filed Vitals:   02/09/13 1159  BP: 105/51  Pulse: 112  Temp:   Resp:     Intake/Output Summary (Last 24 hours) at 02/09/13 1217 Last data filed at 02/09/13 1121  Gross per 24 hour  Intake   2150 ml  Output   5600 ml  Net  -3450 ml   Filed Weights   02/08/13 0930 02/08/13 1128 02/09/13 0500  Weight: 118.927 kg (262 lb 3 oz) 117 kg (257 lb 15 oz) 121.5 kg (267 lb 13.7 oz)    Exam:   General:  NAD  Cardiovascular: s1, s2, irregular  Respiratory: cta b  Abdomen: soft, nt, bs+  Musculoskeletal: 3+ edema in LE with deep erythema noted, LLE is warm to touch   Data Reviewed: Basic Metabolic  Panel:  Recent Labs Lab 02/08/13 0757 02/09/13 0454  NA 138 142  K 3.6 3.8  CL 98 102  CO2 34* 37*  GLUCOSE 114* 190*  BUN 22 15  CREATININE 1.29 1.18  CALCIUM 8.4 8.1*   Liver Function Tests: No results found for this basename: AST, ALT, ALKPHOS, BILITOT, PROT, ALBUMIN,  in the last 168 hours No results found for this basename: LIPASE, AMYLASE,  in the last 168 hours No results found for this basename: AMMONIA,  in the last 168 hours CBC:  Recent Labs Lab 02/08/13 0757 02/09/13 0454  WBC 14.0* 12.9*  NEUTROABS 11.2*  --   HGB 10.9* 10.5*  HCT 34.6* 35.2*  MCV 102.1* 104.5*  PLT 275 349   Cardiac Enzymes: No results found for this basename: CKTOTAL, CKMB, CKMBINDEX, TROPONINI,  in the last 168 hours BNP (last 3 results)  Recent Labs  11/17/12 2032 11/27/12 0924 02/08/13 1154  PROBNP 1300.0* 2577.0* 3938.0*   CBG:  Recent Labs Lab 02/08/13 1129 02/08/13 1624 02/08/13 2116 02/09/13 0735 02/09/13 1141  GLUCAP 99 118* 156* 201* 99    Recent Results (from the past 240 hour(s))  MRSA PCR SCREENING     Status: Abnormal   Collection Time    02/08/13 11:26 AM      Result Value Range Status   MRSA by PCR POSITIVE (*) NEGATIVE Final   Comment:            The GeneXpert MRSA Assay (FDA     approved for NASAL specimens     only), is one component of a  comprehensive MRSA colonization     surveillance program. It is not     intended to diagnose MRSA     infection nor to guide or     monitor treatment for     MRSA infections.     RESULT CALLED TO, READ BACK BY AND VERIFIED WITH:     J. CHILDRETH AT 1430 ON 02/08/13 BY S. VANHOORNE     Studies: No results found.  Scheduled Meds: . albuterol  2.5 mg Nebulization Q6H  . Chlorhexidine Gluconate Cloth  6 each Topical Q0600  . digoxin  0.125 mg Oral Daily  . furosemide  40 mg Intravenous BID  . insulin aspart  0-15 Units Subcutaneous TID WC  . insulin aspart  0-5 Units Subcutaneous QHS  . insulin  glargine  25 Units Subcutaneous QHS  . ipratropium  0.5 mg Nebulization Q6H  . lithium carbonate  300 mg Oral BID WC  . metoprolol tartrate  25 mg Oral BID  . morphine  15 mg Oral BID  . mupirocin ointment  1 application Nasal BID  . pantoprazole  40 mg Oral Daily  . piperacillin-tazobactam (ZOSYN)  IV  3.375 g Intravenous Q8H  . sodium chloride  3 mL Intravenous Q12H  . vancomycin  1,000 mg Intravenous Q12H  . warfarin  0.5 mg Oral Once  . Warfarin - Pharmacist Dosing Inpatient   Does not apply Q24H   Continuous Infusions: . diltiazem (CARDIZEM) infusion Stopped (02/08/13 1145)    Principal Problem:   Cellulitis of left lower extremity Active Problems:   Bipolar disorder   Obstructive sleep apnea   DM (diabetes mellitus)   HTN (hypertension)   Chronic respiratory failure with hypoxia   Acute on chronic diastolic CHF (congestive heart failure)   Atrial fibrillation with RVR    Time spent:    MEMON,JEHANZEB  [facility] Pager [phone]. If 7PM-7AM, please contact night-coverage at [email or web address], password [facility] 02/09/2013, 12:17 PM  LOS: 1 day

## 2013-02-09 NOTE — Progress Notes (Signed)
Patient sitting on side of bed to eat supper.  Edema to bilateral legs with weeping areas.  Patient complains of pain and discomfort and encouraged patient to lay back and elevate legs.  Patient verbalized he could not stand the constant pain of his legs hurting.  Assisted patient back to bed and pain meds and medication for anxiety given.  Patient with good urine output.  Swelling decreased from this am, but still having copious amounts of serous drainage from bilateral legs, left more prominent than the right.  Patient resting after assisting back to bed.

## 2013-02-09 NOTE — Clinical Social Work Psychosocial (Signed)
    Clinical Social Work Department BRIEF PSYCHOSOCIAL ASSESSMENT 02/09/2013  Patient:  Sean Warren, Sean Warren     Account Number:  [id number]     Admit date:  02/08/2013  Clinical Social Worker:  Santa Genera, CLINICAL SOCIAL WORKER  Date/Time:  02/09/2013 10:30 AM  Referred by:  Physician  Date Referred:  02/09/2013 Referred for  SNF Placement   Other Referral:   Interview type:  Patient Other interview type:    PSYCHOSOCIAL DATA Living Status:  FRIEND(S) Admitted from facility:   Level of care:   Primary support name:  [name] Primary support relationship to patient:  SIBLING Degree of support available:   Sister lives in [place], patient lives w friends/caregivers in [place].    CURRENT CONCERNS Current Concerns  Post-Acute Placement   Other Concerns:    SOCIAL WORK ASSESSMENT / PLAN CSW met w patient at bedside, patient alert and oriented x4.  Expresses frustration at being in hospital, states his intention to go home as soon as his friends arrive.  Also says his legs are quite painful, he cannot walk, but feels he would be able to elevate his legs on his own at home. Previously had agreed to SNF placement, requested facility in [place].    Patient lives w friends/caregivers in [place].  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  Has gone between [place] and [place] for past two years.  Has children and ex-wife in [place], more family in [place].    Patient states that his caregivers at home are able to manage his care needs at this time.  [place] that he wants to return home.  However, during first contact this AM, was willing to consider SNF bed offers in order to rehab prior to returning home.  CSW reviewed chart and noted that patient had been at [facility] in September 2014, transferred to [facility] and was discharged from [facility] to [facility]. Patient stated that he would go to SNF but "only if it doesnt cost me anything."  Explained requirements  of his Medicare policy, patient says that he hoped to qualify for Medicaid while at SNF in order to receive help wMedicare copays and deductibles , but was not able to qualify.  CSW encouraged patient to discuss application process w SNF CSW when admitted so that paperwork could be completed in a timely manner while at SNF.    CSW will continue to work w patient and seek bed offers should patient desire SNF rehab at discharge.   Assessment/plan status:  Psychosocial Support/Ongoing Assessment of Needs Other assessment/ plan:   Information/referral to community resources:   SNF list    PATIENT'S/FAMILY'S RESPONSE TO PLAN OF CARE: Patient vacillates between being agreeable to SNF and wanting to return home at discharge.  CSW will provide bed options for patient choice at discharge.   Santa Genera, LCSW Clinical Social Worker [phone])

## 2013-02-10 ENCOUNTER — Inpatient Hospital Stay (HOSPITAL_COMMUNITY): Payer: Medicare Other

## 2013-02-10 DIAGNOSIS — F319 Bipolar disorder, unspecified: Secondary | ICD-10-CM

## 2013-02-10 LAB — BLOOD GAS, ARTERIAL
Acid-Base Excess: 12.2 mmol/L — ABNORMAL HIGH (ref 0.0–2.0)
Drawn by: 27407
O2 Content: 2 L/min
O2 Saturation: 90.6 %
Patient temperature: 37
pCO2 arterial: 61.3 mmHg (ref 35.0–45.0)
pO2, Arterial: 59.7 mmHg — ABNORMAL LOW (ref 80.0–100.0)

## 2013-02-10 LAB — CBC
Hemoglobin: 11.1 g/dL — ABNORMAL LOW (ref 13.0–17.0)
MCH: 31.5 pg (ref 26.0–34.0)
MCHC: 29.9 g/dL — ABNORMAL LOW (ref 30.0–36.0)
Platelets: 408 10*3/uL — ABNORMAL HIGH (ref 150–400)
WBC: 12.5 10*3/uL — ABNORMAL HIGH (ref 4.0–10.5)

## 2013-02-10 LAB — BASIC METABOLIC PANEL
BUN: 12 mg/dL (ref 6–23)
Calcium: 8.8 mg/dL (ref 8.4–10.5)
Chloride: 102 mEq/L (ref 96–112)
GFR calc Af Amer: 81 mL/min — ABNORMAL LOW (ref >90)
GFR calc non Af Amer: 70 mL/min — ABNORMAL LOW (ref >90)
Potassium: 4.5 mEq/L (ref 3.5–5.1)
Sodium: 145 mEq/L (ref 135–145)

## 2013-02-10 LAB — PROTIME-INR
INR: 1.84 — ABNORMAL HIGH (ref 0.00–1.49)
Prothrombin Time: 20.7 seconds — ABNORMAL HIGH (ref 11.6–15.2)

## 2013-02-10 LAB — GLUCOSE, CAPILLARY: Glucose-Capillary: 165 mg/dL — ABNORMAL HIGH (ref 70–99)

## 2013-02-10 MED ORDER — DILTIAZEM HCL ER COATED BEADS 180 MG PO CP24
180.0000 mg | ORAL_CAPSULE | Freq: Every day | ORAL | Status: DC
  Administered 2013-02-10 – 2013-02-11 (×2): 180 mg via ORAL
  Filled 2013-02-10 (×2): qty 1

## 2013-02-10 MED ORDER — FUROSEMIDE 10 MG/ML IJ SOLN
20.0000 mg | Freq: Two times a day (BID) | INTRAMUSCULAR | Status: DC
  Administered 2013-02-10 – 2013-02-11 (×2): 20 mg via INTRAVENOUS
  Filled 2013-02-10 (×2): qty 2

## 2013-02-10 MED ORDER — NALOXONE HCL 0.4 MG/ML IJ SOLN
INTRAMUSCULAR | Status: AC
  Filled 2013-02-10: qty 1

## 2013-02-10 MED ORDER — WARFARIN SODIUM 2 MG PO TABS
4.0000 mg | ORAL_TABLET | Freq: Once | ORAL | Status: AC
  Administered 2013-02-10: 4 mg via ORAL
  Filled 2013-02-10: qty 2

## 2013-02-10 MED ORDER — NALOXONE HCL 0.4 MG/ML IJ SOLN
0.4000 mg | INTRAMUSCULAR | Status: DC | PRN
  Administered 2013-02-10: 0.4 mg via INTRAVENOUS

## 2013-02-10 NOTE — Progress Notes (Signed)
ANTICOAGULATION CONSULT NOTE  Pharmacy Consult for Warfarin Indication: atrial fibrillation  Allergies  Allergen Reactions  . Flexeril [Cyclobenzaprine] Other (See Comments)    Alters Mental Status-ALL MUSCLE RELAXERS  . Muscle Relief [Capsaicin]     Hysteria    Patient Measurements: Height: 5\' 9"  (175.3 cm) Weight: 257 lb 11.5 oz (116.9 kg) IBW/kg (Calculated) : 70.7  Vital Signs: Temp: 98 F (36.7 C) (11/25 0735) Temp src: Oral (11/25 0735) BP: 129/73 mmHg (11/25 0800) Pulse Rate: 37 (11/25 0900)  Labs:  Recent Labs  02/08/13 0757 02/08/13 1154 02/09/13 0454 02/10/13 0820  HGB 10.9*  --  10.5* 11.1*  HCT 34.6*  --  35.2* 37.1*  PLT 275  --  349 408*  LABPROT  --  76.8* 30.5* 20.7*  INR  --  >10.01* 3.06* 1.84*  CREATININE 1.29  --  1.18 1.12   Estimated Creatinine Clearance: 89.6 ml/min (by C-G formula based on Cr of 1.12).  Medical History: Past Medical History  Diagnosis Date  . Hypertension   . Diabetes mellitus   . Bipolar 1 disorder   . Hyperlipidemia   . Atrial fibrillation   . COPD (chronic obstructive pulmonary disease)   . Obstructive sleep apnea   . Chronic diastolic heart failure     Echocardiogram 05/14/11: Mild LVH, EF 50-55%, no wall motion abnormalities, grade 2 diastolic dysfunction.  . Chest pain     Myoview 05/17/11 demonstrated an EF of 61%, small fixed inferoseptal defect consistent with thinning vs prior infarct; no ischemia. - low risk  . Diabetic neuropathy   . Chronic back pain   . History of stroke   . Mental disorder     bipolar  . CHF (congestive heart failure)   . Stroke   . Neuromuscular disorder     diabetic neuropathy  . Chronic respiratory failure with hypoxia 08/08/2012  . Noncompliance with medication regimen   . Tobacco abuse    Medications:  Prescriptions prior to admission  Medication Sig Dispense Refill  . albuterol (PROVENTIL) (5 MG/ML) 0.5% nebulizer solution Take 2.5 mg by nebulization 2 (two) times daily.       . clonazePAM (KLONOPIN) 0.5 MG tablet Take 1 tablet by mouth 3 (three) times daily as needed. For anxiety      . diazepam (VALIUM) 10 MG tablet Take 1 tablet by mouth daily as needed for anxiety.       . furosemide (LASIX) 20 MG tablet Take 1 tablet (20 mg total) by mouth 2 (two) times daily.      Marland Kitchen glimepiride (AMARYL) 2 MG tablet Take 2 mg by mouth daily.      Marland Kitchen HYDROcodone-acetaminophen (NORCO) 5-325 MG per tablet Take 2 tablets by mouth every 4 (four) hours as needed.  15 tablet  0  . insulin aspart (NOVOLOG) 100 UNIT/ML injection Inject 0-20 Units into the skin as needed for high blood sugar (Has a sliding scale at home).      . insulin glargine (LANTUS) 100 UNIT/ML injection Inject 0.25 mLs (25 Units total) into the skin at bedtime.      Marland Kitchen lisinopril (PRINIVIL,ZESTRIL) 40 MG tablet Take 40 mg by mouth daily.      Marland Kitchen lithium carbonate 300 MG capsule Take 300 mg by mouth 2 (two) times daily with a meal.      . morphine (MS CONTIN) 15 MG 12 hr tablet Take 15 mg by mouth 2 (two) times daily.      Marland Kitchen omeprazole (PRILOSEC) 20 MG  capsule Take 20 mg by mouth daily.      . potassium chloride (K-DUR) 10 MEQ tablet Take 10 mEq by mouth daily.      Marland Kitchen warfarin (COUMADIN) 4 MG tablet Take 2-4 mg by mouth daily. Alternates between taking a whole tablet and a half tablet daily.       Assessment: 57 yo M on chronic Coumadin for hx Afib.  Home dose listed above.  INR was elevated (>10) on admission and Vitamin K 5mg  PO was given.  INR is now sub-therapeutic.  No bleeding noted.   Goal of Therapy:  INR 2-3   Plan:  Coumadin 4mg  today x 1 dose. Daily PT/INR.  Elson Clan 02/10/2013,10:43 AM

## 2013-02-10 NOTE — Clinical Social Work Note (Signed)
CSW followed up with pt to discuss SNF bed offers. Pt states that he plans to return home at d/c to his friend's house. He said that this friend is "very good to me." Pt reports that [facility] completed [place] application while he was there, but it was denied. He understands that he has already used 20 days and said he would be responsible for $152 a day which he cannot afford. Pt said [facility] has [facility] and is agreeable to resuming this at d/c.   Derenda Fennel, LCSW [phone]

## 2013-02-10 NOTE — Progress Notes (Signed)
UR chart review completed.  

## 2013-02-10 NOTE — Progress Notes (Signed)
CRITICAL VALUE ALERT  Critical value received:  PCO2 61.3  Date of notification:  02/10/13  Time of notification:  1848  Critical value read back: yes  Nurse who received alert:  Lovena Neighbours RN  MD notified (1st page):  [phone]

## 2013-02-10 NOTE — Progress Notes (Signed)
[facility] PROGRESS NOTE  Sean Warren MWN:[id number] DOB: 1955/11/09 DOA: 02/08/2013 PCP: [name], MD  Assessment/Plan: 1. Cellulitis of left lower extremity. Likely also has some underlying venous stasis in bilateral lower charities. Erythema appears to be slowly improving, but I think he would benefit from continuing vancomycin and Zosyn. Keep both extremities elevated. Check venous dopplers to rule out DVT 2. Acute on chronic diastolic congestive heart failure. BNP is elevated. Urine output has been good with IV Lasix. Continue to monitor ins and outs. 3. Diabetes. Continue Lantus and insulin insulin sliding. 4. Chronic respiratory failure. Appears to be at baseline. Continue supportive oxygen and monitor. 5. COPD.  No evidence of wheezing.  Continue pulmonary hygiene and bronchodilators 6. Atrial fibrillation with rapid ventricular response. Patient did not tolerate Cardizem infusion due to hypotension. He was started on digoxin with some improvement in his heart rate. He is on low dose lopressor.  Will add oral cardizem since he has a history of COPD and would avoid up titrating beta blocker. He is anticoagulated with Coumadin 7. Bipolar disorder. Continue lithium.  Code Status: full code Family Communication: discussed with patient Disposition Plan: discharge home once improved.  Patient has declined any SNF placement on discharge   Consultants:  none  Procedures:  none  Antibiotics:  Vancomycin 11/23  Zosyn 11/23  HPI/Subjective: Feels the same, no new complaints, wants to go home  Objective: Filed Vitals:   02/10/13 0900  BP:   Pulse: 37  Temp:   Resp: 17    Intake/Output Summary (Last 24 hours) at 02/10/13 1156 Last data filed at 02/10/13 0700  Gross per 24 hour  Intake   2630 ml  Output   3900 ml  Net  -1270 ml   Filed Weights   02/08/13 1128 02/09/13 0500 02/10/13 0500  Weight: 117 kg (257 lb 15 oz) 121.5 kg (267 lb 13.7 oz) 116.9 kg  (257 lb 11.5 oz)    Exam:   General:  NAD  Cardiovascular: s1, s2, irregular  Respiratory: cta b  Abdomen: soft, nt, bs+  Musculoskeletal: 2+ edema in LE with deep erythema noted, LLE is warm to touch   Data Reviewed: Basic Metabolic Panel:  Recent Labs Lab 02/08/13 0757 02/09/13 0454 02/10/13 0820  NA 138 142 145  K 3.6 3.8 4.5  CL 98 102 102  CO2 34* 37* 40*  GLUCOSE 114* 190* 93  BUN 22 15 12   CREATININE 1.29 1.18 1.12  CALCIUM 8.4 8.1* 8.8   Liver Function Tests: No results found for this basename: AST, ALT, ALKPHOS, BILITOT, PROT, ALBUMIN,  in the last 168 hours No results found for this basename: LIPASE, AMYLASE,  in the last 168 hours No results found for this basename: AMMONIA,  in the last 168 hours CBC:  Recent Labs Lab 02/08/13 0757 02/09/13 0454 02/10/13 0820  WBC 14.0* 12.9* 12.5*  NEUTROABS 11.2*  --   --   HGB 10.9* 10.5* 11.1*  HCT 34.6* 35.2* 37.1*  MCV 102.1* 104.5* 105.4*  PLT 275 349 408*   Cardiac Enzymes: No results found for this basename: CKTOTAL, CKMB, CKMBINDEX, TROPONINI,  in the last 168 hours BNP (last 3 results)  Recent Labs  11/17/12 2032 11/27/12 0924 02/08/13 1154  PROBNP 1300.0* 2577.0* 3938.0*   CBG:  Recent Labs Lab 02/09/13 0735 02/09/13 1141 02/09/13 1652 02/09/13 2121 02/10/13 0737  GLUCAP 201* 99 223* 109* 84    Recent Results (from the past 240 hour(s))  MRSA PCR SCREENING  Status: Abnormal   Collection Time    02/08/13 11:26 AM      Result Value Range Status   MRSA by PCR POSITIVE (*) NEGATIVE Final   Comment:            The GeneXpert MRSA Assay (FDA     approved for NASAL specimens     only), is one component of a     comprehensive MRSA colonization     surveillance program. It is not     intended to diagnose MRSA     infection nor to guide or     monitor treatment for     MRSA infections.     RESULT CALLED TO, READ BACK BY AND VERIFIED WITH:     J. CHILDRETH AT 1430 ON 02/08/13 BY  S. VANHOORNE     Studies: No results found.  Scheduled Meds: . albuterol  2.5 mg Nebulization Q6H  . Chlorhexidine Gluconate Cloth  6 each Topical Q0600  . digoxin  0.125 mg Oral Daily  . diltiazem  180 mg Oral Daily  . furosemide  20 mg Intravenous BID  . insulin aspart  0-15 Units Subcutaneous TID WC  . insulin aspart  0-5 Units Subcutaneous QHS  . insulin glargine  25 Units Subcutaneous QHS  . ipratropium  0.5 mg Nebulization Q6H  . lithium carbonate  300 mg Oral BID WC  . metoprolol tartrate  25 mg Oral BID  . morphine  15 mg Oral BID  . mupirocin ointment  1 application Nasal BID  . pantoprazole  40 mg Oral Daily  . piperacillin-tazobactam (ZOSYN)  IV  3.375 g Intravenous Q8H  . sodium chloride  3 mL Intravenous Q12H  . vancomycin  1,000 mg Intravenous Q12H  . warfarin  4 mg Oral ONCE-1800  . Warfarin - Pharmacist Dosing Inpatient   Does not apply Q24H   Continuous Infusions: . diltiazem (CARDIZEM) infusion Stopped (02/08/13 1145)    Principal Problem:   Cellulitis of left lower extremity Active Problems:   Bipolar disorder   Obstructive sleep apnea   DM (diabetes mellitus)   HTN (hypertension)   Chronic respiratory failure with hypoxia   Acute on chronic diastolic CHF (congestive heart failure)   Atrial fibrillation with RVR    Time spent:    Sean Warren  [facility] Pager [phone]. If 7PM-7AM, please contact night-coverage at [email or web address], password [facility] 02/10/2013, 11:56 AM  LOS: 2 days

## 2013-02-10 NOTE — Care Management Note (Addendum)
    Page 1 of 2   02/11/2013     1:08:40 PM   CARE MANAGEMENT NOTE 02/11/2013  Patient:  Sean Warren, Sean Warren   Account Number:  [id number]  Date Initiated:  02/10/2013  Documentation initiated by:  Sharrie Rothman  Subjective/Objective Assessment:   Pt admitted from home with cellulitis. Pt lives with a friend (DD) and will return home with friend at discharge. Pt is active with [facility] and will resume at discharge. Pt has hospital bed, glucometer, INR machine, home O2, neb, cpap, walker     Action/Plan:   w/c and cane. CM will arrange resumption of HH at discharge.   Anticipated DC Date:  02/12/2013   Anticipated DC Plan:  HOME W HOME HEALTH SERVICES      DC Planning Services  CM consult      [facility] Choice  Resumption Of Svcs/PTA Provider   Choice offered to / List presented to:  C-1 Patient        HH arranged  HH-1 RN  HH-2 PT  HH-10 DISEASE MANAGEMENT      HH agency  [facility]   Status of service:  Completed, signed off Medicare Important Message given?   (If response is "NO", the following Medicare IM given date fields will be blank) Date Medicare IM given:   Date Additional Medicare IM given:    Discharge Disposition:  HOME W HOME HEALTH SERVICES  Per UR Regulation:    If discussed at Long Length of Stay Meetings, dates discussed:    Comments:  02/11/13 1310 Arlyss Queen, RN BSN CM Pt signed out AMA. Alroy Bailiff of [facility] notified and will contact pts PCP for furthur HH orders.  02/10/13 1400 Arlyss Queen, RN BSN CM

## 2013-02-11 LAB — CBC
HCT: 32.9 % — ABNORMAL LOW (ref 39.0–52.0)
Hemoglobin: 10 g/dL — ABNORMAL LOW (ref 13.0–17.0)
MCV: 103.8 fL — ABNORMAL HIGH (ref 78.0–100.0)
Platelets: 404 10*3/uL — ABNORMAL HIGH (ref 150–400)
RBC: 3.17 MIL/uL — ABNORMAL LOW (ref 4.22–5.81)
RDW: 15.6 % — ABNORMAL HIGH (ref 11.5–15.5)
WBC: 11.5 10*3/uL — ABNORMAL HIGH (ref 4.0–10.5)

## 2013-02-11 LAB — BASIC METABOLIC PANEL
BUN: 11 mg/dL (ref 6–23)
CO2: 37 mEq/L — ABNORMAL HIGH (ref 19–32)
Chloride: 100 mEq/L (ref 96–112)
GFR calc Af Amer: >90 mL/min (ref >90)
GFR calc non Af Amer: 80 mL/min — ABNORMAL LOW (ref >90)
Glucose, Bld: 161 mg/dL — ABNORMAL HIGH (ref 70–99)
Potassium: 4.3 mEq/L (ref 3.5–5.1)
Sodium: 141 mEq/L (ref 135–145)

## 2013-02-11 LAB — GLUCOSE, CAPILLARY

## 2013-02-11 LAB — PROTIME-INR: INR: 1.86 — ABNORMAL HIGH (ref 0.00–1.49)

## 2013-02-11 MED ORDER — SILVER SULFADIAZINE 1 % EX CREA
TOPICAL_CREAM | Freq: Every day | CUTANEOUS | Status: DC
  Administered 2013-02-11: 1 via TOPICAL
  Filled 2013-02-11: qty 85

## 2013-02-11 MED ORDER — CLINDAMYCIN HCL 300 MG PO CAPS: 300.0000 mg | ORAL_CAPSULE | Freq: Three times a day (TID) | ORAL | Status: DC

## 2013-02-11 MED ORDER — CLINDAMYCIN HCL 150 MG PO CAPS
300.0000 mg | ORAL_CAPSULE | Freq: Three times a day (TID) | ORAL | Status: DC
  Filled 2013-02-11 (×7): qty 2

## 2013-02-11 NOTE — Discharge Summary (Signed)
Physician Discharge Summary  Sean Warren ZOX:[id number] DOB: 04-Dec-1955 DOA: 02/08/2013  PCP: [name], MD  PATIENT LEFT AMA  Admit date: 02/08/2013 Discharge date: 02/11/2013  Recommendations for Outpatient Follow-up:  1. Followup left lower extremity cellulitis 2. Followup acute on chronic diastolic congestive heart failure 3. Followup atrial fibrillation with rapid ventricular response 4. Followup subtherapeutic INR  Discharge Diagnoses:  1. Cellulitis left lower extremity 2. Acute on chronic diastolic congestive heart failure 3. Atrial fibrillation with rapid ventricular response 4. Supratherapeutic INR 5. Chronic hypoxic respiratory failure on home oxygen 6. Diabetes mellitus with diabetic neuropathy 7. Obstructive sleep apnea  Discharge Condition: Patient left AMA Disposition: Stable  Recommend heart healthy diet  Filed Weights   02/09/13 0500 02/10/13 0500 02/11/13 0530  Weight: 121.5 kg (267 lb 13.7 oz) 116.9 kg (257 lb 11.5 oz) 118.8 kg (261 lb 14.5 oz)    History of present illness:  56 year old man who was recently hospitalized for ventilator-dependent respiratory failure at [facility] was then transferred to long-term acute care and hospital and then to [facility]. He was subsequently discharged home. He presented to the emergency department 11/18 for left leg pain and was diagnosed with cellulitis. He refused testing and refused admission. He was determined to have capacity at that time. He presented 11/23 with worsening cellulitis of his leg and bilateral lower extremity edema. He was admitted for left lower extremity cellulitis, acute on chronic diastolic heart failure, atrial fibrillation with rapid ventricular response.  Hospital Course:  Sean Warren is improved with treatment. Cellulitis improving on IV antibiotics. Heart failure clinically improving, still evidence of significant lower extremity edema. Atrial fibrillation with borderline control 90s to  100s.  1. Cellulitis left lower extremity: Appears to be improving. Underlying venous stasis bilateral lower extremities. On vancomycin and Zosyn. 2. Acute on chronic diastolic congestive heart failure: Appears to be resolved. Excellent urine output. Would continue IV Lasix today. 3. Atrial fibrillation with rapid ventricular response: Did not tolerate Cardizem infusion secondary to hypotension. Resume chronic doses of Cardizem and propranolol (patient confirms he has been taking these medications at home, this is consistent with previous admissions) 4. Supratherapeutic INR: Greater than 10 on admission. Given vitamin K 5 on admission. Now INR somewhat subtherapeutic. No history of clotting. Resume warfarin. 5. Chronic hypoxic respiratory failure on home oxygen: Appears stable. 6. Diabetes mellitus with diabetic neuropathy: Stable. Continue Lantus and sliding scale insulin. 7. Obstructive sleep apnea 8. COPD: Stable. Continue nebulizer therapy. 9. Bipolar disorder: Continue lithium. 10. Anemia: Etiology unclear. Appears to be at baseline.   Patient advised to stay for additional treatment for his left lower extremity cellulitis and heart failure. He declines, clearly understands that treatment is incomplete and he may need to be rehospitalized in the near future, infection could progress and cause life-threatening illness. He is alert and has capacity to make this decision. Discussed with RN Arline Asp present.   In an effort to help with resolution cellulitis, will prescribe oral antibiotics. Close outpatient followup is recommended. He knows to have INR checked within 48 hours and then until stable and was advised of effect of antibiotics on INR. Resume oral Lasix, diltiazem and propranolol dosing as per outpatient regimen. Patient confirms he has been on these medications although they are not listed in the medical reconciliation Recommend home health wound care   Discharge Instructions      Medication List    TAKE these medications       clindamycin 300 MG capsule  Commonly  known as:  CLEOCIN  Take 1 capsule (300 mg total) by mouth every 8 (eight) hours.      ASK your doctor about these medications       albuterol (5 MG/ML) 0.5% nebulizer solution  Commonly known as:  PROVENTIL  Take 2.5 mg by nebulization 2 (two) times daily.     clonazePAM 0.5 MG tablet  Commonly known as:  KLONOPIN  Take 1 tablet by mouth 3 (three) times daily as needed. For anxiety     diazepam 10 MG tablet  Commonly known as:  VALIUM  Take 1 tablet by mouth daily as needed for anxiety.     furosemide 20 MG tablet  Commonly known as:  LASIX  Take 1 tablet (20 mg total) by mouth 2 (two) times daily.     glimepiride 2 MG tablet  Commonly known as:  AMARYL  Take 2 mg by mouth daily.     HYDROcodone-acetaminophen 5-325 MG per tablet  Commonly known as:  NORCO  Take 2 tablets by mouth every 4 (four) hours as needed.     insulin aspart 100 UNIT/ML injection  Commonly known as:  novoLOG  Inject 0-20 Units into the skin as needed for high blood sugar (Has a sliding scale at home).     insulin glargine 100 UNIT/ML injection  Commonly known as:  LANTUS  Inject 0.25 mLs (25 Units total) into the skin at bedtime.     lisinopril 40 MG tablet  Commonly known as:  PRINIVIL,ZESTRIL  Take 40 mg by mouth daily.     lithium carbonate 300 MG capsule  Take 300 mg by mouth 2 (two) times daily with a meal.     morphine 15 MG 12 hr tablet  Commonly known as:  MS CONTIN  Take 15 mg by mouth 2 (two) times daily.     omeprazole 20 MG capsule  Commonly known as:  PRILOSEC  Take 20 mg by mouth daily.     potassium chloride 10 MEQ tablet  Commonly known as:  K-DUR  Take 10 mEq by mouth daily.     warfarin 4 MG tablet  Commonly known as:  COUMADIN  Take 2-4 mg by mouth daily. Alternates between taking a whole tablet and a half tablet daily.       Allergies  Allergen Reactions  . Flexeril  [Cyclobenzaprine] Other (See Comments)    Alters Mental Status-ALL MUSCLE RELAXERS  . Muscle Relief [Capsaicin]     Hysteria     The results of significant diagnostics from this hospitalization (including imaging, microbiology, ancillary and laboratory) are listed below for reference.    Significant Diagnostic Studies: US Venous Img Lower Unilateral Left  02/10/2013   CLINICAL DATA:  History of painful left leg with edema.  EXAM: VENOUS DOPPLER ULTRASOUND OF LEFT LOWER EXTREMITY  TECHNIQUE: Gray-scale sonography with graded compression, as well as color Doppler and duplex ultrasound, were performed to evaluate the deep venous system from the level of the common femoral vein through the popliteal and proximal calf veins. Spectral Doppler was utilized to evaluate flow at rest and with distal augmentation maneuvers.  COMPARISON:  None.  FINDINGS: Thrombus within deep veins:  None visualized.  Compressibility of deep veins:  Normal.  Duplex waveform respiratory phasicity:  Normal.  Duplex waveform response to augmentation:  Normal.  Venous reflux:  None visualized.  Other findings: No superficial venous thrombosis was visualized. There is some edema in the soft tissues in the calf area.  IMPRESSION:  Examination in some areas compromised by body habitus. There is no evidence of deep venous thrombosis involving the veins of the left leg. There is some edema in the soft tissues in the calf area.   Electronically Signed   By: Onalee Hua  Call M.D.   On: 02/10/2013 15:03    Microbiology: Recent Results (from the past 240 hour(s))  MRSA PCR SCREENING     Status: Abnormal   Collection Time    02/08/13 11:26 AM      Result Value Range Status   MRSA by PCR POSITIVE (*) NEGATIVE Final   Comment:            The GeneXpert MRSA Assay (FDA     approved for NASAL specimens     only), is one component of a     comprehensive MRSA colonization     surveillance program. It is not     intended to diagnose MRSA      infection nor to guide or     monitor treatment for     MRSA infections.     RESULT CALLED TO, READ BACK BY AND VERIFIED WITH:     J. CHILDRETH AT 1430 ON 02/08/13 BY Wynonia Lawman     Labs: Basic Metabolic Panel:  Recent Labs Lab 02/08/13 0757 02/09/13 0454 02/10/13 0820 02/11/13 0711  NA 138 142 145 141  K 3.6 3.8 4.5 4.3  CL 98 102 102 100  CO2 34* 37* 40* 37*  GLUCOSE 114* 190* 93 161*  BUN 22 15 12 11   CREATININE 1.29 1.18 1.12 1.00  CALCIUM 8.4 8.1* 8.8 8.9   CBC:  Recent Labs Lab 02/08/13 0757 02/09/13 0454 02/10/13 0820 02/11/13 0711  WBC 14.0* 12.9* 12.5* 11.5*  NEUTROABS 11.2*  --   --   --   HGB 10.9* 10.5* 11.1* 10.0*  HCT 34.6* 35.2* 37.1* 32.9*  MCV 102.1* 104.5* 105.4* 103.8*  PLT 275 349 408* 404*     Recent Labs  11/17/12 2032 11/27/12 0924 02/08/13 1154  PROBNP 1300.0* 2577.0* 3938.0*   CBG:  Recent Labs Lab 02/10/13 0737 02/10/13 1153 02/10/13 1745 02/10/13 2120 02/11/13 0737  GLUCAP 84 134* 165* 132* 127*    Principal Problem:   Cellulitis of left lower extremity Active Problems:   Bipolar disorder   Obstructive sleep apnea   DM (diabetes mellitus)   HTN (hypertension)   Chronic respiratory failure with hypoxia   Acute on chronic diastolic CHF (congestive heart failure)   Atrial fibrillation with RVR   Time coordinating discharge: 45 minutes  Signed:  Brendia Sacks, MD [facility] 02/11/2013, 7:06 PM

## 2013-02-11 NOTE — Clinical Social Work Note (Signed)
Per CSW Piffard, patient left [facility] AMA because he did not want to pay Medicare copays and said Medicaid was denied.  CSW spoke w [facility] who said that patient's Medicaid application was never completed because he left facility before application could be fully submitted.  CSW will follow up w [place] [employer] [place] to determine current status of patient's application.  Patient has used up his 20 Medicare days w no copay, if discharges to SNF would have to pay full Medicare copay unless Medicaid is approved.  Patient has declined SNF placement "if I have to pay anything."  [place], LCSW Clinical Social Worker [phone])

## 2013-02-11 NOTE — H&P (Signed)
[facility] History and Physical  Sean Warren UJW:[id number] DOB: October 29, 1955 DOA: 02/08/2013  Referring physician: Dr. Adriana Simas, ER physician PCP: [name], MD  Specialists:   Chief Complaint: Lower extremity edema  HPI: Sean Warren is a 57 y.o. male who has multiple medical problems and appears older than stated age. The patient was recently in hospital for respiratory failure required intubation and mechanical ventilation. He does be a difficult wean and therefore was transferred to long-term acute care facility to further manage his ventilator issues. The patient subsequently liberated from the ventilator and discharged from [facility] and transferred to [facility] nursing home. After an extended period there, he was discharged home. The patient reports that for the past several weeks he has noticed erythema in his lower extremities. He's also developed worsening edema in the lower extremities as well. His edema is associated with blistering and weeping lesions. He denies any significant shortness of breath, change in cough, chest pain. He has felt feverish. No vomiting, but he has had occasional loose stools. The patient was evaluated in the emergency room and felt to have a significant cellulitis as well as lower extremity edema requiring inpatient management. He was also found to have rapid atrial fibrillation.  Review of Systems: Pertinent positives as per history of present illness, otherwise negative  Past Medical History  Diagnosis Date  . Hypertension   . Diabetes mellitus   . Bipolar 1 disorder   . Hyperlipidemia   . Atrial fibrillation   . COPD (chronic obstructive pulmonary disease)   . Obstructive sleep apnea   . Chronic diastolic heart failure     Echocardiogram 05/14/11: Mild LVH, EF 50-55%, no wall motion abnormalities, grade 2 diastolic dysfunction.  . Chest pain     Myoview 05/17/11 demonstrated an EF of 61%, small fixed inferoseptal defect  consistent with thinning vs prior infarct; no ischemia. - low risk  . Diabetic neuropathy   . Chronic back pain   . History of stroke   . Mental disorder     bipolar  . CHF (congestive heart failure)   . Stroke   . Neuromuscular disorder     diabetic neuropathy  . Chronic respiratory failure with hypoxia 08/08/2012  . Noncompliance with medication regimen   . Tobacco abuse    Past Surgical History  Procedure Laterality Date  . No past surgeries     Social History:  reports that he has been smoking Cigarettes.  He has a 43.5 pack-year smoking history. He has never used smokeless tobacco. He reports that he does not drink alcohol or use illicit drugs.   Allergies  Allergen Reactions  . Flexeril [Cyclobenzaprine] Other (See Comments)    Alters Mental Status-ALL MUSCLE RELAXERS  . Muscle Relief [Capsaicin]     Hysteria     Family history: Positive for heart disease and hypertension   Prior to Admission medications   Medication Sig Start Date End Date Taking? Authorizing Provider  albuterol (PROVENTIL) (5 MG/ML) 0.5% nebulizer solution Take 2.5 mg by nebulization 2 (two) times daily. 12/05/12  Yes Elliot Cousin, MD  clonazePAM (KLONOPIN) 0.5 MG tablet Take 1 tablet by mouth 3 (three) times daily as needed. For anxiety 01/29/13  Yes Historical Provider, MD  diazepam (VALIUM) 10 MG tablet Take 1 tablet by mouth daily as needed for anxiety.  11/24/12  Yes Historical Provider, MD  furosemide (LASIX) 20 MG tablet Take 1 tablet (20 mg total) by mouth 2 (two) times daily. 12/05/12  Yes Angelique Blonder  Fisher, MD  glimepiride (AMARYL) 2 MG tablet Take 2 mg by mouth daily. 11/12/12  Yes Historical Provider, MD  HYDROcodone-acetaminophen (NORCO) 5-325 MG per tablet Take 2 tablets by mouth every 4 (four) hours as needed. 02/03/13  Yes Geoffery Lyons, MD  insulin aspart (NOVOLOG) 100 UNIT/ML injection Inject 0-20 Units into the skin as needed for high blood sugar (Has a sliding scale at home). 12/05/12  Yes  Elliot Cousin, MD  insulin glargine (LANTUS) 100 UNIT/ML injection Inject 0.25 mLs (25 Units total) into the skin at bedtime. 12/05/12  Yes Elliot Cousin, MD  lisinopril (PRINIVIL,ZESTRIL) 40 MG tablet Take 40 mg by mouth daily. 11/12/12  Yes Historical Provider, MD  lithium carbonate 300 MG capsule Take 600 mg by mouth daily. 01/29/13  Yes Historical Provider, MD  morphine (MS CONTIN) 15 MG 12 hr tablet Take 15 mg by mouth 2 (two) times daily. 01/28/13  Yes Historical Provider, MD  omeprazole (PRILOSEC) 20 MG capsule Take 20 mg by mouth daily.   Yes Historical Provider, MD  potassium chloride (K-DUR) 10 MEQ tablet Take 10 mEq by mouth daily.   Yes Historical Provider, MD  warfarin (COUMADIN) 4 MG tablet Take 2-4 mg by mouth daily. Alternates between taking a whole tablet and a half tablet daily. 02/02/13  Yes Historical Provider, MD   Physical Exam: Filed Vitals:   02/11/13 1100  BP:   Pulse: 86  Temp:   Resp: 18     General:  NAD, morbidly obese  Eyes: PERRLA  ENT: mucous membranes are moist  Neck: supple  Cardiovascular: s1, s2, irregular  Respiratory: cta b, limited exam due to body habitus  Abdomen: obese, nt, nd, bs+  Skin: diffuse erythema over bilateral lower extremities with weeping lesions, warm  Musculoskeletal: 3+ pitting edema bilaterally  Psychiatric: normal affect, cooperative with exam  Neurologic: grossly intact, nonfocal  Labs on Admission:  Basic Metabolic Panel:  Recent Labs Lab 02/08/13 0757 02/09/13 0454 02/10/13 0820 02/11/13 0711  NA 138 142 145 141  K 3.6 3.8 4.5 4.3  CL 98 102 102 100  CO2 34* 37* 40* 37*  GLUCOSE 114* 190* 93 161*  BUN 22 15 12 11   CREATININE 1.29 1.18 1.12 1.00  CALCIUM 8.4 8.1* 8.8 8.9   Liver Function Tests: No results found for this basename: AST, ALT, ALKPHOS, BILITOT, PROT, ALBUMIN,  in the last 168 hours No results found for this basename: LIPASE, AMYLASE,  in the last 168 hours No results found for this  basename: AMMONIA,  in the last 168 hours CBC:  Recent Labs Lab 02/08/13 0757 02/09/13 0454 02/10/13 0820 02/11/13 0711  WBC 14.0* 12.9* 12.5* 11.5*  NEUTROABS 11.2*  --   --   --   HGB 10.9* 10.5* 11.1* 10.0*  HCT 34.6* 35.2* 37.1* 32.9*  MCV 102.1* 104.5* 105.4* 103.8*  PLT 275 349 408* 404*   Cardiac Enzymes: No results found for this basename: CKTOTAL, CKMB, CKMBINDEX, TROPONINI,  in the last 168 hours  BNP (last 3 results)  Recent Labs  11/17/12 2032 11/27/12 0924 02/08/13 1154  PROBNP 1300.0* 2577.0* 3938.0*   CBG:  Recent Labs Lab 02/10/13 0737 02/10/13 1153 02/10/13 1745 02/10/13 2120 02/11/13 0737  GLUCAP 84 134* 165* 132* 127*    Radiological Exams on Admission: US Venous Img Lower Unilateral Left  02/10/2013   CLINICAL DATA:  History of painful left leg with edema.  EXAM: VENOUS DOPPLER ULTRASOUND OF LEFT LOWER EXTREMITY  TECHNIQUE: Gray-scale sonography with graded  compression, as well as color Doppler and duplex ultrasound, were performed to evaluate the deep venous system from the level of the common femoral vein through the popliteal and proximal calf veins. Spectral Doppler was utilized to evaluate flow at rest and with distal augmentation maneuvers.  COMPARISON:  None.  FINDINGS: Thrombus within deep veins:  None visualized.  Compressibility of deep veins:  Normal.  Duplex waveform respiratory phasicity:  Normal.  Duplex waveform response to augmentation:  Normal.  Venous reflux:  None visualized.  Other findings: No superficial venous thrombosis was visualized. There is some edema in the soft tissues in the calf area.  IMPRESSION: Examination in some areas compromised by body habitus. There is no evidence of deep venous thrombosis involving the veins of the left leg. There is some edema in the soft tissues in the calf area.   Electronically Signed   By: Onalee Hua  Call M.D.   On: 02/10/2013 15:03    EKG: Independently reviewed. Rapid atrial  fib  Assessment/Plan Principal Problem:   Cellulitis of left lower extremity Active Problems:   Bipolar disorder   Obstructive sleep apnea   DM (diabetes mellitus)   HTN (hypertension)   Chronic respiratory failure with hypoxia   Acute on chronic diastolic CHF (congestive heart failure)   Atrial fibrillation with RVR   1. Cellulitis of left lower extremity. Likely has some underlying venous stasis as well. We'll start the patient on vancomycin and Zosyn. Keep both extremities elevated. 2. Acute on chronic diastolic congestive heart failure lower extremity edema. Start the patient on IV Lasix. Monitor strict I.'s and O.'s. Ejection fraction was done earlier this year and was a poor study due to body habitus. This does not need to be repeated at this time. Check BMP. 3. Atrial fibrillation with a ventricular response. Start on a Cardizem infusion having difficulty tolerating this due to hypotension. He's not a good candidate for amiodarone due to his underlying lung disease. We'll give a trial of digoxin. 4. Diabetes. Continue Lantus and start sliding scale insulin. 5. Chronic respiratory failure with hypoxia. Patient feels that his breathing is currently at baseline. We will continue to monitor. 6. Bipolar disorder. Continue outpatient dose of lithium.  Code Status: full code Family Communication: discussed with patient Disposition Plan: pending hospital course, may need placement  Time spent:  Cailen Texeira [facility] Pager [phone]  If 7PM-7AM, please contact night-coverage [email or web address] Password [facility] 02/11/2013, 2:23 PM

## 2013-02-11 NOTE — Progress Notes (Signed)
PT HAS TAKEN ALL OF MONITORING EQUIPMENT OF. IV REMOVED. REFUSING FURTHER MEDS. HE HAS CALLED FOR HIS CARE GIVERS TO PICK HIM UP. LLE LOWER PORTION COVERED W/ SILVADINE CREAM AND WRAPPED W/ GAUZE.

## 2013-02-11 NOTE — Progress Notes (Signed)
PT IS VERY BELIGERENT. WANTS TO GO HOME. TALKING ABOUT LEAVING AMA. DR GOODRICH NOTIFIED.

## 2013-02-11 NOTE — Progress Notes (Signed)
[facility] PROGRESS NOTE  URAL ACREE ZOX:[id number] DOB: [birth year] DOA: 02/08/2013 PCP: [name], MD  Patient leaving AMA  Assessment/Plan: 1. Cellulitis left lower extremity: Appears to be improving. Underlying venous stasis bilateral lower extremities. On vancomycin and Zosyn. 2. Acute on chronic diastolic congestive heart failure: Appears to be resolved. Excellent urine output. Would continue IV Lasix today. 3. Atrial fibrillation with rapid ventricular response: Did not tolerate Cardizem infusion secondary to hypotension. Resume chronic doses of Cardizem and propranolol (patient confirms he has been taking these medications at home, this is consistent with previous admissions) 4. Supratherapeutic INR: Greater than 10 on admission. Given vitamin K 5 on admission. Now INR somewhat subtherapeutic. No history of clotting. Resume warfarin. 5. Chronic hypoxic respiratory failure on home oxygen: Appears stable. 6. Diabetes mellitus with diabetic neuropathy: Stable. Continue Lantus and sliding scale insulin. 7. Obstructive sleep apnea 8. COPD: Stable. Continue nebulizer therapy. 9. Bipolar disorder: Continue lithium. 10. Anemia: Etiology unclear. Appears to be at baseline.   Patient advised to stay for additional treatment for his left lower extremity cellulitis and heart failure. He declines, clearly understands that treatment is incomplete and he may need to be rehospitalized in the near future, infection could progress and cause life-threatening illness. He is alert and has capacity to make this decision. Discussed with RN Arline Asp present.  In an effort to help with resolution cellulitis, will prescribe oral antibiotics. Close outpatient followup is recommended. He knows to have INR checked closely until stable and was advised of effect of antibiotics.  Resume oral Lasix, diltiazem and propranolol dosing as per outpatient regimen. Patient confirms he has been on these  medications although they are not listed in the medical reconciliation  Recommend home health wound care  Pending studies:   none  Code Status: Full code  Family Communication: none present Disposition Plan: Resume home health RN, PT, disease management on discharge. Pt has hospital bed, glucometer, INR machine, home O2, neb, cpap, walker   Brendia Sacks, MD  [facility]  Pager [phone] If 7PM-7AM, please contact night-coverage at [email or web address], password [facility] 02/11/2013, 9:17 AM  LOS: 3 days   Summary: 57 year old man who was recently hospitalized for ventilator-dependent respiratory failure at [facility] was then transferred to long-term acute care and hospital and then to [facility]. He was subsequently discharged home. He presented to the emergency department 11/18 for left leg pain and was diagnosed with cellulitis. He refused testing and refused admission. He was determined to have capacity at that time. He presented 11/23 with worsening cellulitis of his leg and bilateral lower extremity edema. He was admitted for left lower extremity cellulitis, acute on chronic diastolic heart failure, atrial fibrillation with rapid ventricular response.  Consultants:  None   Procedures:  None  Antibiotics:  Vancomycin 11/23  >>  Zosyn 11/23 >>   HPI/Subjective: Patient declined skilled nursing facility that offers. He is agreeable to home health. Patient wants to leave the hospital today. He feels good. No nausea, vomiting. No chest pain or trouble breathing. No left lower extremity pain. He feels the left lower extremity looks better and feels better.  Objective: Filed Vitals:   02/11/13 0530 02/11/13 0600 02/11/13 0700 02/11/13 0800  BP: 123/66 122/63 118/66 109/67  Pulse:  103 89 48  Temp: 98.5 F (36.9 C)     TempSrc: Oral     Resp: 21 24 17 13   Height:      Weight: 118.8 kg (261 lb 14.5 oz)  SpO2:  89% 97% 65%    Intake/Output Summary (Last 24 hours) at  02/11/13 0917 Last data filed at 02/11/13 0533  Gross per 24 hour  Intake   1020 ml  Output   4500 ml  Net  -3480 ml     Filed Weights   02/09/13 0500 02/10/13 0500 02/11/13 0530  Weight: 121.5 kg (267 lb 13.7 oz) 116.9 kg (257 lb 11.5 oz) 118.8 kg (261 lb 14.5 oz)    Exam:   Afebrile. Vital signs stable. Heart rate controlled. Stable hypoxia. General: Appears calm and comfortable.  Psychiatric: Grossly normal mood and affect. Speech fluent and appropriate. He spontaneously recalled the details of his previous hospitalization as well as his presentation here and clearly articulates followup plans. He is alert and oriented.  Cardiovascular: Irregular. Normal rate. No murmur, rub or gallop. 2-3+ bilateral lower extremity edema left greater than right.   Telemetry atrial fibrillation, ventricular rate 90s  Respiratory: Diminished breath sounds. Clear to auscultation bilaterally. No wheezes, rales or rhonchi. Normal respiratory effort.  Abdomen: Soft, nontender, nondistended.  Skin: Right lower extremity with chronic venous stasis changes. No superimposed erythema.  Left lower extremity: Marked erythema below the knee to above the ankle. There is some decompressed bulla. There is mild heat. Nontender. No fluctuance appreciated. The foot is uninvolved. Toes and foot warm and dry. Dorsalis pedis pulse 2+. Excellent capillary refill distal foot.  Data Reviewed:  Weights have varied from 117-123 and accuracy is unclear.  Excellent urine output, -4550. -8.2 L since admission  Capillary blood sugars stable  ABG 11/25 revealed normal pH. PCO2 61, consistent with previous laboratory values.  Basic metabolic panel shows chronic hypercarbia. BUN, creatinine normal.  WBC trending downwards, 11.5 today.  Hemoglobin stable at 10.0.  INR 1.86  Left lower extremity venous Doppler was limited but no evidence of DVT.  Scheduled Meds: . albuterol  2.5 mg Nebulization Q6H  .  Chlorhexidine Gluconate Cloth  6 each Topical Q0600  . digoxin  0.125 mg Oral Daily  . diltiazem  180 mg Oral Daily  . furosemide  20 mg Intravenous BID  . insulin aspart  0-15 Units Subcutaneous TID WC  . insulin aspart  0-5 Units Subcutaneous QHS  . insulin glargine  25 Units Subcutaneous QHS  . ipratropium  0.5 mg Nebulization Q6H  . lithium carbonate  300 mg Oral BID WC  . metoprolol tartrate  25 mg Oral BID  . mupirocin ointment  1 application Nasal BID  . pantoprazole  40 mg Oral Daily  . piperacillin-tazobactam (ZOSYN)  IV  3.375 g Intravenous Q8H  . sodium chloride  3 mL Intravenous Q12H  . vancomycin  1,000 mg Intravenous Q12H  . Warfarin - Pharmacist Dosing Inpatient   Does not apply Q24H   Continuous Infusions: . diltiazem (CARDIZEM) infusion Stopped (02/08/13 1145)    Principal Problem:   Cellulitis of left lower extremity Active Problems:   Bipolar disorder   Obstructive sleep apnea   DM (diabetes mellitus)   HTN (hypertension)   Chronic respiratory failure with hypoxia   Acute on chronic diastolic CHF (congestive heart failure)   Atrial fibrillation with RVR   Time spent 40 minutes

## 2013-02-11 NOTE — Progress Notes (Signed)
PT CONTINUES TO ATTEMPT TO  REACH HIS CARE GIVERS VIA PHONE  SO HE CAN LEAVE AMA.

## 2013-02-11 NOTE — Progress Notes (Signed)
PT HAS SIGNED AMA FORMED AND LEFT VIA W/C W/ CAREGIVER.

## 2013-03-09 ENCOUNTER — Encounter (HOSPITAL_BASED_OUTPATIENT_CLINIC_OR_DEPARTMENT_OTHER): Payer: Medicare Other

## 2013-03-23 ENCOUNTER — Telehealth: Payer: Self-pay | Admitting: Cardiovascular Disease

## 2013-03-23 MED ORDER — FUROSEMIDE 20 MG PO TABS: 20.0000 mg | ORAL_TABLET | Freq: Two times a day (BID) | ORAL | Status: DC

## 2013-03-23 NOTE — Telephone Encounter (Signed)
Received fax refill request  Rx # [id number] Medication:  Furosemide 40 mg tablet Qty 60 Sig:  Take one tablet by mouth twice a day Physician:  Eden EmmsNishan

## 2013-04-06 ENCOUNTER — Encounter (HOSPITAL_BASED_OUTPATIENT_CLINIC_OR_DEPARTMENT_OTHER): Payer: Medicare Other | Attending: Plastic Surgery

## 2013-04-16 ENCOUNTER — Inpatient Hospital Stay (HOSPITAL_COMMUNITY)
Admission: EM | Admit: 2013-04-16 | Discharge: 2013-04-20 | DRG: 309 | Disposition: A | Discharge status: Home Health | Payer: Medicare Other | Attending: Internal Medicine | Admitting: Internal Medicine

## 2013-04-16 ENCOUNTER — Emergency Department (HOSPITAL_COMMUNITY): Payer: Medicare Other

## 2013-04-16 DIAGNOSIS — I5032 Chronic diastolic (congestive) heart failure: Secondary | ICD-10-CM | POA: Diagnosis present

## 2013-04-16 DIAGNOSIS — E785 Hyperlipidemia, unspecified: Secondary | ICD-10-CM | POA: Diagnosis present

## 2013-04-16 DIAGNOSIS — N189 Chronic kidney disease, unspecified: Secondary | ICD-10-CM | POA: Diagnosis present

## 2013-04-16 DIAGNOSIS — R000 Tachycardia, unspecified: Secondary | ICD-10-CM

## 2013-04-16 DIAGNOSIS — I4892 Unspecified atrial flutter: Secondary | ICD-10-CM

## 2013-04-16 DIAGNOSIS — Z9119 Patient's noncompliance with other medical treatment and regimen: Secondary | ICD-10-CM

## 2013-04-16 DIAGNOSIS — J9620 Acute and chronic respiratory failure, unspecified whether with hypoxia or hypercapnia: Secondary | ICD-10-CM

## 2013-04-16 DIAGNOSIS — I639 Cerebral infarction, unspecified: Secondary | ICD-10-CM

## 2013-04-16 DIAGNOSIS — J9611 Chronic respiratory failure with hypoxia: Secondary | ICD-10-CM | POA: Diagnosis present

## 2013-04-16 DIAGNOSIS — E870 Hyperosmolality and hypernatremia: Secondary | ICD-10-CM

## 2013-04-16 DIAGNOSIS — E1149 Type 2 diabetes mellitus with other diabetic neurological complication: Secondary | ICD-10-CM | POA: Diagnosis present

## 2013-04-16 DIAGNOSIS — R55 Syncope and collapse: Secondary | ICD-10-CM

## 2013-04-16 DIAGNOSIS — R0602 Shortness of breath: Secondary | ICD-10-CM | POA: Diagnosis present

## 2013-04-16 DIAGNOSIS — E876 Hypokalemia: Secondary | ICD-10-CM

## 2013-04-16 DIAGNOSIS — Z6830 Body mass index (BMI) 30.0-30.9, adult: Secondary | ICD-10-CM

## 2013-04-16 DIAGNOSIS — J40 Bronchitis, not specified as acute or chronic: Secondary | ICD-10-CM

## 2013-04-16 DIAGNOSIS — E875 Hyperkalemia: Secondary | ICD-10-CM | POA: Diagnosis not present

## 2013-04-16 DIAGNOSIS — J441 Chronic obstructive pulmonary disease with (acute) exacerbation: Secondary | ICD-10-CM | POA: Diagnosis present

## 2013-04-16 DIAGNOSIS — Z79899 Other long term (current) drug therapy: Secondary | ICD-10-CM

## 2013-04-16 DIAGNOSIS — E162 Hypoglycemia, unspecified: Secondary | ICD-10-CM | POA: Diagnosis present

## 2013-04-16 DIAGNOSIS — I480 Paroxysmal atrial fibrillation: Secondary | ICD-10-CM

## 2013-04-16 DIAGNOSIS — J9610 Chronic respiratory failure, unspecified whether with hypoxia or hypercapnia: Secondary | ICD-10-CM | POA: Diagnosis present

## 2013-04-16 DIAGNOSIS — L97909 Non-pressure chronic ulcer of unspecified part of unspecified lower leg with unspecified severity: Secondary | ICD-10-CM | POA: Diagnosis present

## 2013-04-16 DIAGNOSIS — E1142 Type 2 diabetes mellitus with diabetic polyneuropathy: Secondary | ICD-10-CM | POA: Diagnosis present

## 2013-04-16 DIAGNOSIS — Z7901 Long term (current) use of anticoagulants: Secondary | ICD-10-CM

## 2013-04-16 DIAGNOSIS — E669 Obesity, unspecified: Secondary | ICD-10-CM | POA: Diagnosis present

## 2013-04-16 DIAGNOSIS — J189 Pneumonia, unspecified organism: Secondary | ICD-10-CM

## 2013-04-16 DIAGNOSIS — E1169 Type 2 diabetes mellitus with other specified complication: Secondary | ICD-10-CM | POA: Diagnosis present

## 2013-04-16 DIAGNOSIS — G4733 Obstructive sleep apnea (adult) (pediatric): Secondary | ICD-10-CM | POA: Diagnosis present

## 2013-04-16 DIAGNOSIS — I509 Heart failure, unspecified: Secondary | ICD-10-CM | POA: Diagnosis present

## 2013-04-16 DIAGNOSIS — I4891 Unspecified atrial fibrillation: Principal | ICD-10-CM | POA: Diagnosis present

## 2013-04-16 DIAGNOSIS — Z720 Tobacco use: Secondary | ICD-10-CM

## 2013-04-16 DIAGNOSIS — I10 Essential (primary) hypertension: Secondary | ICD-10-CM

## 2013-04-16 DIAGNOSIS — Z9981 Dependence on supplemental oxygen: Secondary | ICD-10-CM

## 2013-04-16 DIAGNOSIS — L03119 Cellulitis of unspecified part of limb: Secondary | ICD-10-CM

## 2013-04-16 DIAGNOSIS — E119 Type 2 diabetes mellitus without complications: Secondary | ICD-10-CM

## 2013-04-16 DIAGNOSIS — L03116 Cellulitis of left lower limb: Secondary | ICD-10-CM

## 2013-04-16 DIAGNOSIS — F319 Bipolar disorder, unspecified: Secondary | ICD-10-CM | POA: Diagnosis present

## 2013-04-16 DIAGNOSIS — I5033 Acute on chronic diastolic (congestive) heart failure: Secondary | ICD-10-CM

## 2013-04-16 DIAGNOSIS — R21 Rash and other nonspecific skin eruption: Secondary | ICD-10-CM

## 2013-04-16 DIAGNOSIS — L02419 Cutaneous abscess of limb, unspecified: Secondary | ICD-10-CM | POA: Diagnosis present

## 2013-04-16 DIAGNOSIS — Z8673 Personal history of transient ischemic attack (TIA), and cerebral infarction without residual deficits: Secondary | ICD-10-CM

## 2013-04-16 DIAGNOSIS — T68XXXA Hypothermia, initial encounter: Secondary | ICD-10-CM

## 2013-04-16 DIAGNOSIS — Z91199 Patient's noncompliance with other medical treatment and regimen due to unspecified reason: Secondary | ICD-10-CM

## 2013-04-16 DIAGNOSIS — I129 Hypertensive chronic kidney disease with stage 1 through stage 4 chronic kidney disease, or unspecified chronic kidney disease: Secondary | ICD-10-CM | POA: Diagnosis present

## 2013-04-16 DIAGNOSIS — F17200 Nicotine dependence, unspecified, uncomplicated: Secondary | ICD-10-CM | POA: Diagnosis present

## 2013-04-16 LAB — CBC WITH DIFFERENTIAL/PLATELET
BASOS ABS: 0 10*3/uL (ref 0.0–0.1)
Basophils Relative: 0 % (ref 0–1)
Eosinophils Absolute: 0.3 10*3/uL (ref 0.0–0.7)
Eosinophils Relative: 2 % (ref 0–5)
HCT: 37.3 % — ABNORMAL LOW (ref 39.0–52.0)
Hemoglobin: 11.5 g/dL — ABNORMAL LOW (ref 13.0–17.0)
Lymphocytes Relative: 16 % (ref 12–46)
Lymphs Abs: 1.8 10*3/uL (ref 0.7–4.0)
MCH: 29.5 pg (ref 26.0–34.0)
MCHC: 30.8 g/dL (ref 30.0–36.0)
MCV: 95.6 fL (ref 78.0–100.0)
MONO ABS: 0.5 10*3/uL (ref 0.1–1.0)
Monocytes Relative: 4 % (ref 3–12)
Neutro Abs: 9.1 10*3/uL — ABNORMAL HIGH (ref 1.7–7.7)
Neutrophils Relative %: 78 % — ABNORMAL HIGH (ref 43–77)
Platelets: 239 10*3/uL (ref 150–400)
RBC: 3.9 MIL/uL — ABNORMAL LOW (ref 4.22–5.81)
RDW: 15.9 % — AB (ref 11.5–15.5)
WBC: 11.6 10*3/uL — ABNORMAL HIGH (ref 4.0–10.5)

## 2013-04-16 LAB — PROTIME-INR
INR: 1.61 — AB (ref 0.00–1.49)
PROTHROMBIN TIME: 18.7 s — AB (ref 11.6–15.2)

## 2013-04-16 LAB — GLUCOSE, CAPILLARY
Glucose-Capillary: 134 mg/dL — ABNORMAL HIGH (ref 70–99)
Glucose-Capillary: 107 mg/dL — ABNORMAL HIGH (ref 70–99)

## 2013-04-16 MED ORDER — ALBUTEROL SULFATE (2.5 MG/3ML) 0.083% IN NEBU
5.0000 mg | INHALATION_SOLUTION | Freq: Once | RESPIRATORY_TRACT | Status: AC
  Administered 2013-04-16: 5 mg via RESPIRATORY_TRACT
  Filled 2013-04-16: qty 6

## 2013-04-16 MED ORDER — IPRATROPIUM BROMIDE 0.02 % IN SOLN
0.5000 mg | Freq: Once | RESPIRATORY_TRACT | Status: AC
  Administered 2013-04-16: 0.5 mg via RESPIRATORY_TRACT
  Filled 2013-04-16: qty 2.5

## 2013-04-16 NOTE — ED Notes (Signed)
Pt arrives from home via EMS, originally called to home for increased sob and swelling in lower legs. EMS states that upon arrival cbg was 14, pt was alert and oriented. Given 2 amps of d50, cbg then was 59. Pt currently complains of blurred vision and headache.Denies cp.

## 2013-04-16 NOTE — ED Notes (Signed)
CBG 134 

## 2013-04-16 NOTE — ED Notes (Signed)
Dr. Wickline at bedside.  

## 2013-04-17 ENCOUNTER — Inpatient Hospital Stay (HOSPITAL_COMMUNITY): Payer: Medicare Other

## 2013-04-17 ENCOUNTER — Encounter (HOSPITAL_COMMUNITY): Payer: Self-pay | Admitting: Emergency Medicine

## 2013-04-17 DIAGNOSIS — F319 Bipolar disorder, unspecified: Secondary | ICD-10-CM

## 2013-04-17 DIAGNOSIS — R0902 Hypoxemia: Secondary | ICD-10-CM

## 2013-04-17 DIAGNOSIS — E162 Hypoglycemia, unspecified: Secondary | ICD-10-CM | POA: Diagnosis present

## 2013-04-17 DIAGNOSIS — J441 Chronic obstructive pulmonary disease with (acute) exacerbation: Secondary | ICD-10-CM

## 2013-04-17 DIAGNOSIS — I369 Nonrheumatic tricuspid valve disorder, unspecified: Secondary | ICD-10-CM

## 2013-04-17 DIAGNOSIS — I4891 Unspecified atrial fibrillation: Secondary | ICD-10-CM | POA: Diagnosis present

## 2013-04-17 DIAGNOSIS — J9610 Chronic respiratory failure, unspecified whether with hypoxia or hypercapnia: Secondary | ICD-10-CM

## 2013-04-17 DIAGNOSIS — R0602 Shortness of breath: Secondary | ICD-10-CM | POA: Diagnosis present

## 2013-04-17 DIAGNOSIS — I5032 Chronic diastolic (congestive) heart failure: Secondary | ICD-10-CM

## 2013-04-17 LAB — BASIC METABOLIC PANEL
BUN: 16 mg/dL (ref 6–23)
BUN: 16 mg/dL (ref 6–23)
CHLORIDE: 106 meq/L (ref 96–112)
CO2: 31 mEq/L (ref 19–32)
CO2: 27 mEq/L (ref 19–32)
CREATININE: 1.08 mg/dL (ref 0.50–1.35)
Calcium: 8.1 mg/dL — ABNORMAL LOW (ref 8.4–10.5)
Calcium: 7.5 mg/dL — ABNORMAL LOW (ref 8.4–10.5)
Chloride: 105 mEq/L (ref 96–112)
Creatinine, Ser: 1.05 mg/dL (ref 0.50–1.35)
GFR calc Af Amer: 88 mL/min — ABNORMAL LOW (ref >90)
GFR calc non Af Amer: 73 mL/min — ABNORMAL LOW (ref >90)
GFR calc non Af Amer: 76 mL/min — ABNORMAL LOW (ref >90)
GFR, EST AFRICAN AMERICAN: 85 mL/min — AB (ref >90)
Glucose, Bld: 130 mg/dL — ABNORMAL HIGH (ref 70–99)
Glucose, Bld: 364 mg/dL — ABNORMAL HIGH (ref 70–99)
POTASSIUM: 4.1 meq/L (ref 3.7–5.3)
POTASSIUM: 4.3 meq/L (ref 3.7–5.3)
SODIUM: 146 meq/L (ref 137–147)
SODIUM: 142 meq/L (ref 137–147)

## 2013-04-17 LAB — GLUCOSE, CAPILLARY
GLUCOSE-CAPILLARY: 243 mg/dL — AB (ref 70–99)
Glucose-Capillary: 253 mg/dL — ABNORMAL HIGH (ref 70–99)
Glucose-Capillary: 269 mg/dL — ABNORMAL HIGH (ref 70–99)
Glucose-Capillary: 328 mg/dL — ABNORMAL HIGH (ref 70–99)
Glucose-Capillary: 302 mg/dL — ABNORMAL HIGH (ref 70–99)
Glucose-Capillary: 233 mg/dL — ABNORMAL HIGH (ref 70–99)
Glucose-Capillary: 195 mg/dL — ABNORMAL HIGH (ref 70–99)
Glucose-Capillary: 261 mg/dL — ABNORMAL HIGH (ref 70–99)
Glucose-Capillary: 203 mg/dL — ABNORMAL HIGH (ref 70–99)

## 2013-04-17 LAB — CBC
HCT: 33.8 % — ABNORMAL LOW (ref 39.0–52.0)
HEMOGLOBIN: 10.2 g/dL — AB (ref 13.0–17.0)
MCH: 29.1 pg (ref 26.0–34.0)
MCHC: 30.2 g/dL (ref 30.0–36.0)
MCV: 96.6 fL (ref 78.0–100.0)
Platelets: 239 10*3/uL (ref 150–400)
RBC: 3.5 MIL/uL — AB (ref 4.22–5.81)
RDW: 16 % — ABNORMAL HIGH (ref 11.5–15.5)
WBC: 11.8 10*3/uL — ABNORMAL HIGH (ref 4.0–10.5)

## 2013-04-17 LAB — SEDIMENTATION RATE: SED RATE: 40 mm/h — AB (ref 0–16)

## 2013-04-17 LAB — PROTIME-INR
INR: 1.64 — ABNORMAL HIGH (ref 0.00–1.49)
Prothrombin Time: 19 seconds — ABNORMAL HIGH (ref 11.6–15.2)

## 2013-04-17 LAB — PRO B NATRIURETIC PEPTIDE: PRO B NATRI PEPTIDE: 1419 pg/mL — AB (ref 0–125)

## 2013-04-17 LAB — LITHIUM LEVEL: Lithium Lvl: 0.41 mEq/L — ABNORMAL LOW (ref 0.80–1.40)

## 2013-04-17 LAB — MRSA PCR SCREENING: MRSA by PCR: NEGATIVE

## 2013-04-17 MED ORDER — NICOTINE 21 MG/24HR TD PT24
21.0000 mg | MEDICATED_PATCH | Freq: Every day | TRANSDERMAL | Status: DC
  Administered 2013-04-17 – 2013-04-20 (×4): 21 mg via TRANSDERMAL
  Filled 2013-04-17 (×4): qty 1

## 2013-04-17 MED ORDER — WARFARIN - PHARMACIST DOSING INPATIENT
Freq: Every day | Status: DC
Start: 2013-04-17 — End: 2013-04-20

## 2013-04-17 MED ORDER — DILTIAZEM HCL 100 MG IV SOLR
5.0000 mg/h | INTRAVENOUS | Status: DC
  Administered 2013-04-17: 5 mg/h via INTRAVENOUS

## 2013-04-17 MED ORDER — WARFARIN SODIUM 6 MG PO TABS
6.0000 mg | ORAL_TABLET | Freq: Once | ORAL | Status: AC
  Administered 2013-04-17: 6 mg via ORAL
  Filled 2013-04-17: qty 1

## 2013-04-17 MED ORDER — INSULIN ASPART 100 UNIT/ML ~~LOC~~ SOLN: 0.0000 [IU] | SUBCUTANEOUS | Status: DC | PRN

## 2013-04-17 MED ORDER — WARFARIN - PHYSICIAN DOSING INPATIENT: Freq: Every day | Status: DC

## 2013-04-17 MED ORDER — INSULIN ASPART 100 UNIT/ML ~~LOC~~ SOLN
0.0000 [IU] | Freq: Three times a day (TID) | SUBCUTANEOUS | Status: DC
  Administered 2013-04-17: 5 [IU] via SUBCUTANEOUS
  Administered 2013-04-17: 3 [IU] via SUBCUTANEOUS

## 2013-04-17 MED ORDER — POTASSIUM CHLORIDE ER 10 MEQ PO TBCR
10.0000 meq | EXTENDED_RELEASE_TABLET | Freq: Every day | ORAL | Status: DC
  Administered 2013-04-17 – 2013-04-20 (×4): 10 meq via ORAL
  Filled 2013-04-17 (×4): qty 1

## 2013-04-17 MED ORDER — WARFARIN SODIUM 5 MG PO TABS
5.0000 mg | ORAL_TABLET | ORAL | Status: DC
  Filled 2013-04-17: qty 1

## 2013-04-17 MED ORDER — DILTIAZEM HCL 60 MG PO TABS
60.0000 mg | ORAL_TABLET | Freq: Two times a day (BID) | ORAL | Status: DC
  Filled 2013-04-17 (×2): qty 1

## 2013-04-17 MED ORDER — SODIUM CHLORIDE 0.9 % IV SOLN: 250.0000 mL | INTRAVENOUS | Status: DC | PRN

## 2013-04-17 MED ORDER — SODIUM CHLORIDE 0.9 % IJ SOLN: 3.0000 mL | INTRAMUSCULAR | Status: DC | PRN

## 2013-04-17 MED ORDER — ONDANSETRON HCL 4 MG/2ML IJ SOLN
4.0000 mg | Freq: Four times a day (QID) | INTRAMUSCULAR | Status: DC | PRN
  Administered 2013-04-17: 4 mg via INTRAVENOUS
  Filled 2013-04-17: qty 2

## 2013-04-17 MED ORDER — PANTOPRAZOLE SODIUM 40 MG PO TBEC
40.0000 mg | DELAYED_RELEASE_TABLET | Freq: Every day | ORAL | Status: DC
  Administered 2013-04-17 – 2013-04-20 (×4): 40 mg via ORAL
  Filled 2013-04-17 (×4): qty 1

## 2013-04-17 MED ORDER — DILTIAZEM HCL 25 MG/5ML IV SOLN
10.0000 mg | Freq: Once | INTRAVENOUS | Status: AC
  Administered 2013-04-17: 10 mg via INTRAVENOUS
  Filled 2013-04-17: qty 5

## 2013-04-17 MED ORDER — LITHIUM CARBONATE 300 MG PO CAPS
300.0000 mg | ORAL_CAPSULE | Freq: Two times a day (BID) | ORAL | Status: DC
  Administered 2013-04-17 – 2013-04-18 (×4): 300 mg via ORAL
  Filled 2013-04-17 (×5): qty 1

## 2013-04-17 MED ORDER — WARFARIN SODIUM 2.5 MG PO TABS
2.5000 mg | ORAL_TABLET | ORAL | Status: DC
Start: 2013-04-18 — End: 2013-04-17

## 2013-04-17 MED ORDER — DILTIAZEM HCL 60 MG PO TABS
60.0000 mg | ORAL_TABLET | Freq: Three times a day (TID) | ORAL | Status: DC
  Administered 2013-04-17 – 2013-04-18 (×4): 60 mg via ORAL
  Filled 2013-04-17 (×7): qty 1

## 2013-04-17 MED ORDER — SODIUM CHLORIDE 0.9 % IJ SOLN
3.0000 mL | Freq: Two times a day (BID) | INTRAMUSCULAR | Status: DC
  Administered 2013-04-17 – 2013-04-20 (×5): 3 mL via INTRAVENOUS

## 2013-04-17 MED ORDER — PERFLUTREN LIPID MICROSPHERE
INTRAVENOUS | Status: AC
  Filled 2013-04-17: qty 10

## 2013-04-17 MED ORDER — MORPHINE SULFATE ER 15 MG PO TBCR
15.0000 mg | EXTENDED_RELEASE_TABLET | Freq: Two times a day (BID) | ORAL | Status: DC
  Administered 2013-04-17 – 2013-04-20 (×7): 15 mg via ORAL
  Filled 2013-04-17 (×7): qty 1

## 2013-04-17 MED ORDER — INSULIN GLARGINE 100 UNIT/ML ~~LOC~~ SOLN
10.0000 [IU] | Freq: Every day | SUBCUTANEOUS | Status: DC
  Administered 2013-04-17 – 2013-04-20 (×4): 10 [IU] via SUBCUTANEOUS
  Filled 2013-04-17 (×4): qty 0.1

## 2013-04-17 MED ORDER — HYDROCODONE-ACETAMINOPHEN 5-325 MG PO TABS
1.0000 | ORAL_TABLET | Freq: Once | ORAL | Status: AC
  Administered 2013-04-17: 1 via ORAL
  Filled 2013-04-17: qty 1

## 2013-04-17 MED ORDER — LISINOPRIL 40 MG PO TABS
40.0000 mg | ORAL_TABLET | Freq: Every day | ORAL | Status: DC
  Administered 2013-04-17: 40 mg via ORAL
  Filled 2013-04-17 (×2): qty 1

## 2013-04-17 MED ORDER — ALBUTEROL SULFATE (2.5 MG/3ML) 0.083% IN NEBU
2.5000 mg | INHALATION_SOLUTION | Freq: Four times a day (QID) | RESPIRATORY_TRACT | Status: DC | PRN
  Administered 2013-04-19: 2.5 mg via RESPIRATORY_TRACT
  Filled 2013-04-17: qty 3

## 2013-04-17 MED ORDER — HYDROCODONE-ACETAMINOPHEN 5-325 MG PO TABS
1.0000 | ORAL_TABLET | Freq: Four times a day (QID) | ORAL | Status: DC | PRN
  Administered 2013-04-17 – 2013-04-19 (×7): 1 via ORAL
  Filled 2013-04-17 (×8): qty 1

## 2013-04-17 MED ORDER — FUROSEMIDE 20 MG PO TABS
20.0000 mg | ORAL_TABLET | Freq: Two times a day (BID) | ORAL | Status: DC
  Administered 2013-04-17 – 2013-04-20 (×4): 20 mg via ORAL
  Filled 2013-04-17 (×11): qty 1

## 2013-04-17 NOTE — Progress Notes (Signed)
Inpatient Diabetes Program Recommendations  AACE/ADA: New Consensus Statement on Inpatient Glycemic Control (2013)  Target Ranges:  Prepandial:   less than 140 mg/dL      Peak postprandial:   less than 180 mg/dL (1-2 hours)      Critically ill patients:  140 - 180 mg/dL  Results for Sean KungURNER, Sean Warren (MRN [id number]) as of 04/17/2013 08:57  Ref. Range 04/16/2013 20:47 04/16/2013 23:22 04/17/2013 02:07 04/17/2013 04:02 04/17/2013 06:09  Glucose-Capillary Latest Range: 70-99 mg/dL 782134 (H) 956107 (H) 213253 (H) 269 (H) 328 (H)    Admitted with hypoglycemia Outpatient Diabetes medications: Amaryl 2mg , Novolog 0-20 per SSI Current orders for Inpatient glycemic control: Novolog 0-20 PRN? Please discontinue PRN order (no parameters)  Inpatient Diabetes Program Recommendations Insulin - Basal: add basal Lantus 20 units  Correction (SSI): add sensitive scale TID+HS scale  This coordinator will page Dr. Jerral RalphGhimire with recommendation. Thank you  Piedad ClimesGina Jelesa Mangini BSN, RN,CDE Inpatient Diabetes Coordinator [phone] (team pager)

## 2013-04-17 NOTE — Progress Notes (Signed)
INITIAL NUTRITION ASSESSMENT  DOCUMENTATION CODES Per approved criteria  -Obesity Unspecified   INTERVENTION: No nutrition intervention at this time --- patient declined RD to follow for nutrition care plan  NUTRITION DIAGNOSIS: Increased nutrient needs related to wound healing as evidenced by estimated nutrition needs  Goal: Pt to meet >/= 90% of their estimated nutrition needs   Monitor:  PO intake, weight, labs, I/O's  Reason for Assessment: Malnutrition Screening Tool Report  58 y.o. male  Admitting Dx: Atrial fibrillation with RVR  ASSESSMENT: Patient with PMH of COPD, CHF, A-fib, OSA presented with confusion and shortness of breath; EMS was called and patient was found to be in A-fib with RVR with glucose of 14.  Patient reports his appetite is good in the hospitalization, however, at home he only consumes 1 meal per day; he states he doesn't get hungry; smokes cigarettes during the day; has had a 30 lb weight loss since November 2014, however, his weights generally fluctuate (likely given fluid); patient is at nutrition risk given chronic medical issues; declined addition of nutrition supplements.  Height: Ht Readings from Last 1 Encounters:  04/17/13 5\' 8"  (1.727 m)    Weight: Wt Readings from Last 1 Encounters:  04/17/13 231 lb 0.7 oz (104.8 kg)    Ideal Body Weight: 154 lb  % Ideal Body Weight: 150%  Wt Readings from Last 20 Encounters:  04/17/13 231 lb 0.7 oz (104.8 kg)  02/11/13 261 lb 14.5 oz (118.8 kg)  12/05/12 273 lb 13 oz (124.2 kg)  11/27/12 265 lb (120.203 kg)  11/19/12 265 lb 14 oz (120.6 kg)  10/31/12 300 lb (136.079 kg)  10/12/12 300 lb (136.079 kg)  08/28/12 260 lb 5.8 oz (118.1 kg)  08/09/12 256 lb 6.3 oz (116.3 kg)  05/08/12 261 lb (118.389 kg)  01/24/12 258 lb 1.9 oz (117.082 kg)  01/04/12 251 lb 12.3 oz (114.2 kg)  01/03/12 258 lb 8 oz (117.255 kg)  06/25/11 224 lb 8 oz (101.833 kg)  05/30/11 225 lb (102.059 kg)  05/16/11 214 lb  (97.07 kg)  05/13/11 216 lb (97.977 kg)    Usual Body Weight: 261 lb  % Usual Body Weight: 88%  BMI:  Body mass index is 35.14 kg/(m^2).  Estimated Nutritional Needs: Kcal: 2000-2200 Protein: 100-110 gm Fluid: 2.0-2.2 L  Skin: chronic bilateral LE ulcerations  Diet Order: Carb Control  EDUCATION NEEDS: -No education needs identified at this time   Intake/Output Summary (Last 24 hours) at 04/17/13 1440 Last data filed at 04/17/13 1300  Gross per 24 hour  Intake 669.34 ml  Output   2625 ml  Net -1955.66 ml    Labs:   Recent Labs Lab 04/16/13 2327 04/17/13 0550  NA 146 142  K 4.1 4.3  CL 106 105  CO2 31 27  BUN 16 16  CREATININE 1.08 1.05  CALCIUM 8.1* 7.5*  GLUCOSE 130* 364*    CBG (last 3)   Recent Labs  04/17/13 0817 04/17/13 1003 04/17/13 1202  GLUCAP 302* 233* 243*    Scheduled Meds: . diltiazem  60 mg Oral Q8H  . furosemide  20 mg Oral BID  . insulin aspart  0-9 Units Subcutaneous TID WC  . insulin glargine  10 Units Subcutaneous Daily  . lisinopril  40 mg Oral Daily  . lithium carbonate  300 mg Oral BID WC  . morphine  15 mg Oral BID  . nicotine  21 mg Transdermal Daily  . pantoprazole  40 mg Oral Daily  .  potassium chloride  10 mEq Oral Daily  . sodium chloride  3 mL Intravenous Q12H  . [START ON 04/18/2013] warfarin  2.5 mg Oral Q48H  . warfarin  5 mg Oral Q48H  . Warfarin - Physician Dosing Inpatient   Does not apply q1800    Continuous Infusions:   Past Medical History  Diagnosis Date  . Hypertension   . Diabetes mellitus   . Bipolar 1 disorder   . Hyperlipidemia   . Atrial fibrillation   . COPD (chronic obstructive pulmonary disease)   . Obstructive sleep apnea   . Chronic diastolic heart failure     Echocardiogram 05/14/11: Mild LVH, EF 50-55%, no wall motion abnormalities, grade 2 diastolic dysfunction.  . Chest pain     Myoview 05/17/11 demonstrated an EF of 61%, small fixed inferoseptal defect consistent with thinning  vs prior infarct; no ischemia. - low risk  . Diabetic neuropathy   . Chronic back pain   . History of stroke   . Mental disorder     bipolar  . CHF (congestive heart failure)   . Stroke   . Neuromuscular disorder     diabetic neuropathy  . Chronic respiratory failure with hypoxia 08/08/2012  . Noncompliance with medication regimen   . Tobacco abuse     Past Surgical History  Procedure Laterality Date  . No past surgeries      Maureen Chatters, RD, LDN Pager #: [phone] After-Hours Pager #: [phone]

## 2013-04-17 NOTE — Consult Note (Signed)
WOC wound consult note Reason for Consult:Bilateral LE ulcerations, chronic, non-healing. Patient states he "bangs his legs and arms all the time". Wound type:venous insufficiency with trauma and/or arterial insufficiency componants Pressure Ulcer POA: Yes Measurement:Left pretibial: 7cm x 2.5cm x 0.4cm.  Right pretibial:  2cm x 1cm x 0.4cm Wound bed: pale pink with dried serum partially obscuring wound bed. Drainage (amount, consistency, odor) No odor, but serous exudate is present in a moderate amount. Periwound: Hemosiderin staining present bilaterally, edematous Dressing procedure/placement/frequency: Due to the exudate and edema, I will implement an absorptive dressing as a wound contact layer and with the chronicity of the wounds, that dressing is impregnated with a silver additive (silver hydrofiber, Aquacel Ag+, Hart RochesterLawson [id number]).  Nursing to apply once daily, then cover with a soft silicone foam.  To address edema without firm compression I will ask for bilateral ACE wraps from toe to knee (metatarsal head to patellar notch) along with elevation of the extremities as tolerated. WOC nursing team will not follow, but will remain available to this patient, the nursing and medical team.  Please re-consult if needed. Thanks, Ladona MowLaurie Shigeko Manard, MSN, RN, GNP, [place], CWON-AP [phone])

## 2013-04-17 NOTE — H&P (Signed)
PCP:   [name], MD   Chief Complaint:  ams sob  HPI: 58 yo male crf on 2 l o2 at home, copd, chf, afib, osa comes in with confusion and sob earlier at home.  ems was called he was found to be in afib with rvr, and a glucose of 14.  Given d50 and his confusion improved rapidly with correction of his hypoglycemia.  He also had some mild wheezing so was given some albuterol.  Pt is now back to his baseline breathing status and mental status is back to normal, but still in afib w rvr.  He denies recent fevers, n/v/d.  No coughing.  No le edema or swelling.  He has some chronic wounds to ble which have had no change recently.  Pt has eaten in the ED and his glucose is stable now.  Review of Systems:  Positive and negative as per HPI otherwise all other systems are negative  Past Medical History: Past Medical History  Diagnosis Date  . Hypertension   . Diabetes mellitus   . Bipolar 1 disorder   . Hyperlipidemia   . Atrial fibrillation   . COPD (chronic obstructive pulmonary disease)   . Obstructive sleep apnea   . Chronic diastolic heart failure     Echocardiogram 05/14/11: Mild LVH, EF 50-55%, no wall motion abnormalities, grade 2 diastolic dysfunction.  . Chest pain     Myoview 05/17/11 demonstrated an EF of 61%, small fixed inferoseptal defect consistent with thinning vs prior infarct; no ischemia. - low risk  . Diabetic neuropathy   . Chronic back pain   . History of stroke   . Mental disorder     bipolar  . CHF (congestive heart failure)   . Stroke   . Neuromuscular disorder     diabetic neuropathy  . Chronic respiratory failure with hypoxia 08/08/2012  . Noncompliance with medication regimen   . Tobacco abuse    Past Surgical History  Procedure Laterality Date  . No past surgeries      Medications: Prior to Admission medications   Medication Sig Start Date End Date Taking? Authorizing Provider  albuterol (PROVENTIL) (5 MG/ML) 0.5% nebulizer solution Take 2.5 mg by  nebulization every 6 (six) hours as needed for wheezing or shortness of breath.  12/05/12  Yes Elliot Cousin, MD  clonazePAM (KLONOPIN) 0.5 MG tablet Take 1 tablet by mouth 3 (three) times daily as needed. For anxiety 01/29/13  Yes Historical Provider, MD  diazepam (VALIUM) 10 MG tablet Take 1 tablet by mouth daily as needed for anxiety.  11/24/12  Yes Historical Provider, MD  diltiazem (CARDIZEM) 60 MG tablet Take 60 mg by mouth 2 (two) times daily.   Yes Historical Provider, MD  furosemide (LASIX) 20 MG tablet Take 20 mg by mouth 2 (two) times daily.   Yes Historical Provider, MD  glimepiride (AMARYL) 2 MG tablet Take 2 mg by mouth daily. 11/12/12  Yes Historical Provider, MD  HYDROcodone-acetaminophen (NORCO/VICODIN) 5-325 MG per tablet Take 1 tablet by mouth every 6 (six) hours as needed for moderate pain.   Yes Historical Provider, MD  lisinopril (PRINIVIL,ZESTRIL) 40 MG tablet Take 40 mg by mouth daily. 11/12/12  Yes Historical Provider, MD  lithium carbonate 300 MG capsule Take 300 mg by mouth 2 (two) times daily with a meal.   Yes Historical Provider, MD  morphine (MS CONTIN) 15 MG 12 hr tablet Take 15 mg by mouth 2 (two) times daily. 01/28/13  Yes Historical Provider, MD  nicotine (NICODERM CQ - DOSED IN MG/24 HOURS) 21 mg/24hr patch Place 21 mg onto the skin daily.   Yes Historical Provider, MD  nystatin-triamcinolone (MYCOLOG II) cream Apply 1 application topically daily.   Yes Historical Provider, MD  omeprazole (PRILOSEC) 20 MG capsule Take 20 mg by mouth daily.   Yes Historical Provider, MD  potassium chloride (K-DUR) 10 MEQ tablet Take 10 mEq by mouth daily.   Yes Historical Provider, MD  warfarin (COUMADIN) 5 MG tablet Take 2.5-5 mg by mouth daily. *alternates between 2.5mg  one day, then 5mg  the next   Yes Historical Provider, MD  insulin aspart (NOVOLOG) 100 UNIT/ML injection Inject 0-20 Units into the skin as needed for high blood sugar (Has a sliding scale at home). 12/05/12   Elliot Cousin, MD    Allergies:   Allergies  Allergen Reactions  . Flexeril [Cyclobenzaprine] Other (See Comments)    Alters Mental Status-ALL MUSCLE RELAXERS  . Muscle Relief [Capsaicin]     Hysteria     Social History:  reports that he has been smoking Cigarettes.  He has a 43.5 pack-year smoking history. He has never used smokeless tobacco. He reports that he does not drink alcohol or use illicit drugs.  Family History: none  Physical Exam: Filed Vitals:   04/17/13 0000 04/17/13 0030 04/17/13 0132 04/17/13 0145  BP: 132/83 115/99 121/71   Pulse:      Temp:      TempSrc:      Resp: 18 30 22 28   SpO2:   95%    General appearance: alert, cooperative and no distress Head: Normocephalic, without obvious abnormality, atraumatic Eyes: negative Nose: Nares normal. Septum midline. Mucosa normal. No drainage or sinus tenderness. Neck: no JVD and supple, symmetrical, trachea midline Lungs: clear to auscultation bilaterally Heart: irregularly irregular rhythm Abdomen: soft, non-tender; bowel sounds normal; no masses,  no organomegaly Extremities: venous stasis dermatitis noted Pulses: 2+ and symmetric Skin: Skin color, texture, turgor normal. No rashes or lesions Neurologic: Grossly normal  Labs on Admission:   Recent Labs  04/16/13 2327  NA 146  K 4.1  CL 106  CO2 31  GLUCOSE 130*  BUN 16  CREATININE 1.08  CALCIUM 8.1*    Recent Labs  04/16/13 2327  WBC 11.6*  NEUTROABS 9.1*  HGB 11.5*  HCT 37.3*  MCV 95.6  PLT 239   Radiological Exams on Admission: Dg Chest Portable 1 View  04/16/2013   CLINICAL DATA:  Shortness of breath, COPD  EXAM: PORTABLE CHEST - 1 VIEW  COMPARISON:  12/05/2012  FINDINGS: Mild cardiomegaly without CHF or definite pneumonia. No collapse or consolidation. Negative for effusion or pneumothorax. Trachea midline.  IMPRESSION: Cardiomegaly without acute process   Electronically Signed   By: Ruel Favors M.D.   On: 04/16/2013 23:59     Assessment/Plan  58 yo male with metabolic encephalopathy from hypoglycemia medication induced and afib w rvr  Principal Problem:   Atrial fibrillation with RVR- dilt gtt.  Stepdown.  Cont coumadin.    Active Problems:   Bipolar disorder  stable   Obesity (BMI 30.0-34.)  noted   COPD with acute exacerbation  Mild, cont nebs.  No steroids at this time   Chronic respiratory failure with hypoxia  stable   Chronic diastolic heart failure stable   Hypoglycemia  Hold dm meds, 2hour glucose cks for now.   Atrial fibrillation with rapid ventricular response  As above   SOB (shortness of breath)  As above  Sean Warren A 04/17/2013, 1:59 AM

## 2013-04-17 NOTE — ED Notes (Signed)
Pt denies CP and SOB. Pt reports buttock pain.

## 2013-04-17 NOTE — Progress Notes (Signed)
PATIENT DETAILS Name: Sean Warren Age: 58 y.o. Sex: male Date of Birth: Jul 23, 1955 Admit Date: 04/16/2013 Admitting Physician Phillips Grout, MD ERD:EYCXK,GYJEH, MD  Subjective: Current no complaints. Still in A. Fib with RVR and on a Cardizem infusion.  Assessment/Plan: Principal Problem:   Atrial fibrillation with RVR -patient was admitted, and started on Cardizem infusion. Rate much better. Had to transition to oral Cardizem. Cardiology now following. - Also on chronic Coumadin therapy, INR subtherapeutic- been currently dosed by pharmacy  Hypoglycemia - Secondary to poor oral intake, patient uses Amaryl and sliding scale insulin as an outpatient - Allow for some permissive hyper glycemia, check A1c - CBGs now more than 300, start low-dose Lantus and SSI  Chronic bilateral lower extremity wounds - Per wound care - The patient his legs have been unchanged for approximately one year - Check ESR, x-rays of bilateral legs  Bipolar disorder -Currently stable, continue with lithium- levels subtherapeutic on admission  COPD with mild exacerbation - Lungs clear on exam - Continue with nebs, since only minimal exacerbation on admission not started on steroids. Currently doing much better - On home O2  Chronic respiratory failure - On home O2  Obesity - Counseled regarding importance of weight loss  Disposition: Remain inpatient  DVT Prophylaxis: Not needed as on Coumadin  Code Status: Full code   Family Communication None at bedside  Procedures:  None  CONSULTS:  cardiology  Time spent 40 minutes-which includes 50% of the time with face-to-face with patient/ family and coordinating care related to the above assessment and plan.    MEDICATIONS: Scheduled Meds: . diltiazem  60 mg Oral Q8H  . furosemide  20 mg Oral BID  . insulin aspart  0-9 Units Subcutaneous TID WC  . insulin glargine  10 Units Subcutaneous Daily  . lisinopril  40 mg Oral  Daily  . lithium carbonate  300 mg Oral BID WC  . morphine  15 mg Oral BID  . nicotine  21 mg Transdermal Daily  . pantoprazole  40 mg Oral Daily  . potassium chloride  10 mEq Oral Daily  . sodium chloride  3 mL Intravenous Q12H  . [START ON 04/18/2013] warfarin  2.5 mg Oral Q48H  . warfarin  5 mg Oral Q48H  . Warfarin - Physician Dosing Inpatient   Does not apply q1800   Continuous Infusions:  PRN Meds:.sodium chloride, albuterol, HYDROcodone-acetaminophen, sodium chloride  Antibiotics: Anti-infectives   None       PHYSICAL EXAM: Vital signs in last 24 hours: Filed Vitals:   04/17/13 0400 04/17/13 0450 04/17/13 0700 04/17/13 1100  BP: 122/75 132/74 122/68 126/57  Pulse: 105 105 83 81  Temp:   97.6 F (36.4 C) 98.1 F (36.7 C)  TempSrc:   Oral Oral  Resp: _0 Height:  _1  (1.727 m)    Weight:  104.8 kg (231 lb 0.7 oz)    SpO2: 94% 100% 100% 100%    Weight change:  Filed Weights   04/17/13 0450  Weight: 104.8 kg (231 lb 0.7 oz)   Body mass index is 35.14 kg/(m^2).   Gen Exam: Awake and alert with clear speech.   Neck: Supple, No JVD.   Chest: B/L Clear.- However decreased air entry at bases bilaterally CVS: S1 S2 Regular, no murmurs.  Abdomen: soft, BS +, non tender, non distended.  Extremities: no edema, lower extremities warm to touch. Neurologic: Non Focal.  Skin: No Rash.   Wounds: N/A.   Intake/Output from previous day:  Intake/Output Summary (Last 24 hours) at 04/17/13 1215 Last data filed at 04/17/13 1205  Gross per 24 hour  Intake  69.34 ml  Output   2150 ml  Net -2080.66 ml     LAB RESULTS: CBC  Recent Labs Lab 04/16/13 2327 04/17/13 0550  WBC 11.6* 11.8*  HGB 11.5* 10.2*  HCT 37.3* 33.8*  PLT 239 239  MCV 95.6 96.6  MCH 29.5 29.1  MCHC 30.8 30.2  RDW 15.9* 16.0*  LYMPHSABS 1.8  --   MONOABS 0.5  --   EOSABS 0.3  --   BASOSABS 0.0  --     Chemistries   Recent Labs Lab 04/16/13 2327 04/17/13 0550  NA 146 142   K 4.1 4.3  CL 106 105  CO2 31 27  GLUCOSE 130* 364*  BUN 16 16  CREATININE 1.08 1.05  CALCIUM 8.1* 7.5*    CBG:  Recent Labs Lab 04/17/13 0402 04/17/13 0609 04/17/13 0817 04/17/13 1003 04/17/13 1202  GLUCAP 269* 328* 302* 233* 243*    GFR Estimated Creatinine Clearance: 88.9 ml/min (by C-G formula based on Cr of 1.05).  Coagulation profile  Recent Labs Lab 04/16/13 2327 04/17/13 0550  INR 1.61* 1.64*    Cardiac Enzymes No results found for this basename: CK, CKMB, TROPONINI, MYOGLOBIN,  in the last 168 hours  No components found with this basename: POCBNP,  No results found for this basename: DDIMER,  in the last 72 hours No results found for this basename: HGBA1C,  in the last 72 hours No results found for this basename: CHOL, HDL, LDLCALC, TRIG, CHOLHDL, LDLDIRECT,  in the last 72 hours No results found for this basename: TSH, T4TOTAL, FREET3, T3FREE, THYROIDAB,  in the last 72 hours No results found for this basename: VITAMINB12, FOLATE, FERRITIN, TIBC, IRON, RETICCTPCT,  in the last 72 hours No results found for this basename: LIPASE, AMYLASE,  in the last 72 hours  Urine Studies No results found for this basename: UACOL, UAPR, USPG, UPH, UTP, UGL, UKET, UBIL, UHGB, UNIT, UROB, ULEU, UEPI, UWBC, URBC, UBAC, CAST, CRYS, UCOM, BILUA,  in the last 72 hours  MICROBIOLOGY: Recent Results (from the past 240 hour(s))  MRSA PCR SCREENING     Status: None   Collection Time    04/17/13  4:49 AM      Result Value Range Status   MRSA by PCR NEGATIVE  NEGATIVE Final   Comment:            The GeneXpert MRSA Assay (FDA     approved for NASAL specimens     only), is one component of a     comprehensive MRSA colonization     surveillance program. It is not     intended to diagnose MRSA     infection nor to guide or     monitor treatment for     MRSA infections.    RADIOLOGY STUDIES/RESULTS: Dg Tibia/fibula Left  04/17/2013   CLINICAL DATA:  Lower extremity  cellulitis  EXAM: LEFT TIBIA AND FIBULA - 2 VIEW  COMPARISON:  None.  FINDINGS: AP and lateral views of the left tibia and fibula reveal the bones to be adequately mineralized. There is no lytic or blastic lesion. There is no periosteal reaction. The observed portions of the knee and ankle exhibit no significant degenerative change nor evidence of an acute fracture or dislocation. No soft tissue gas collections  are demonstrated. There is irregularity of the surface of the soft tissues over the mid shin consistent with the known cellulitis.  IMPRESSION: There is no plain film evidence of acute osteomyelitis.   Electronically Signed   By: David  Martinique   On: 04/17/2013 10:54   Dg Tibia/fibula Right  04/17/2013   CLINICAL DATA:  Lower extremity cellulitis  EXAM: RIGHT TIBIA AND FIBULA - 2 VIEW  COMPARISON:  None.  FINDINGS: AP and lateral views of the right tibia and fibula reveal the bones to be adequately mineralized. There is no lytic or blastic lesion or periosteal reaction. The observed portions of the knee and ankle reveal no significant degenerative change nor fracture or dislocation. The overlying soft tissues demonstrate mild irregularity over the mid shin anteriorly. There is no soft tissue gas nor foreign body.  IMPRESSION: There is no evidence of acute osteomyelitis.   Electronically Signed   By: David  Martinique   On: 04/17/2013 10:55   Dg Chest Portable 1 View  04/16/2013   CLINICAL DATA:  Shortness of breath, COPD  EXAM: PORTABLE CHEST - 1 VIEW  COMPARISON:  12/05/2012  FINDINGS: Mild cardiomegaly without CHF or definite pneumonia. No collapse or consolidation. Negative for effusion or pneumothorax. Trachea midline.  IMPRESSION: Cardiomegaly without acute process   Electronically Signed   By: Daryll Brod M.D.   On: 04/16/2013 23:59    Oren Binet, MD  [facility] Pager:[phone]  If 7PM-7AM, please contact night-coverage [email or web address] Password [email or web address] 04/17/2013, 12:15 PM    LOS: 1 day

## 2013-04-17 NOTE — Progress Notes (Signed)
Utilization review completed. Olesya Wike, RN, BSN. 

## 2013-04-17 NOTE — Consult Note (Signed)
CARDIOLOGY CONSULT NOTE   Patient ID: Sean Warren MRN: [id number], DOB/AGE: [birth year]   Admit date: 04/16/2013 Date of Consult: 04/17/2013   Primary Physician: Lonia BloodGARBA,LAWAL, MD Primary Cardiologist: Dr. Eden EmmsNishan  Pt. Profile Middle-aged gentleman with bipolar disorder and severe COPD admitted after a hypoglycemic episode.  Problem List  Past Medical History  Diagnosis Date  . Hypertension   . Diabetes mellitus   . Bipolar 1 disorder   . Hyperlipidemia   . Atrial fibrillation   . COPD (chronic obstructive pulmonary disease)   . Obstructive sleep apnea   . Chronic diastolic heart failure     Echocardiogram 05/14/11: Mild LVH, EF 50-55%, no wall motion abnormalities, grade 2 diastolic dysfunction.  . Chest pain     Myoview 05/17/11 demonstrated an EF of 61%, small fixed inferoseptal defect consistent with thinning vs prior infarct; no ischemia. - low risk  . Diabetic neuropathy   . Chronic back pain   . History of stroke   . Mental disorder     bipolar  . CHF (congestive heart failure)   . Stroke   . Neuromuscular disorder     diabetic neuropathy  . Chronic respiratory failure with hypoxia 08/08/2012  . Noncompliance with medication regimen   . Tobacco abuse     Past Surgical History  Procedure Laterality Date  . No past surgeries       Allergies  Allergies  Allergen Reactions  . Flexeril [Cyclobenzaprine] Other (See Comments)    Alters Mental Status-ALL MUSCLE RELAXERS  . Muscle Relief [Capsaicin]     Hysteria     HPI   This 58 year old gentleman has a history of chronic permanent atrial fibrillation.  He has been on long-term Coumadin.  He checks his INR at home and dosage adjustments are managed through the office of his primary care physician Dr. Mikeal HawthorneGarba.  On admission this time his INR is subtherapeutic.  The patient denies missing any doses of his cardiac medication.  The patient denies any history of recent chest pain.  He has not been having any  worsening of his chronic oxygen-dependent dyspnea.  He has had some chronic lower extremity edema with leg ulcers.  Inpatient Medications  . diltiazem  60 mg Oral BID  . furosemide  20 mg Oral BID  . insulin aspart  0-9 Units Subcutaneous TID WC  . insulin glargine  10 Units Subcutaneous Daily  . lisinopril  40 mg Oral Daily  . lithium carbonate  300 mg Oral BID WC  . morphine  15 mg Oral BID  . nicotine  21 mg Transdermal Daily  . pantoprazole  40 mg Oral Daily  . potassium chloride  10 mEq Oral Daily  . sodium chloride  3 mL Intravenous Q12H  . [START ON 04/18/2013] warfarin  2.5 mg Oral Q48H  . warfarin  5 mg Oral Q48H  . Warfarin - Physician Dosing Inpatient   Does not apply q1800    Family History No family history on file.   Social History History   Social History  . Marital Status: Divorced    Spouse Name: N/A    Number of Children: N/A  . Years of Education: N/A   Occupational History  . Not on file.   Social History Main Topics  . Smoking status: Current Every Day Smoker -- 1.50 packs/day for 29 years    Types: Cigarettes  . Smokeless tobacco: Never Used  . Alcohol Use: No  . Drug Use: No  . Sexual  Activity: Not Currently   Other Topics Concern  . Not on file   Social History Narrative  . No narrative on file     Review of Systems  General:  No chills, fever, night sweats or weight changes.  Cardiovascular:  No chest pain,  positive for chronic exertional dyspnea, edema, orthopnea, palpitations, paroxysmal nocturnal dyspnea. Dermatological:  Erythema and swelling of lower extremities with leg ulcers  Respiratory: No cough, dyspnea Urologic: No hematuria, dysuria Abdominal:   No nausea, vomiting, diarrhea, bright red blood per rectum, melena, or hematemesis Neurologic:  No visual changes, wkns, changes in mental status. All other systems reviewed and are otherwise negative except as noted above.  Physical Exam  Blood pressure 122/68, pulse 83,  temperature 97.6 F (36.4 C), temperature source Oral, resp. rate 20, height 5\' 8"  (1.727 m), weight 231 lb 0.7 oz (104.8 kg), SpO2 100.00%.  General: Pleasant, NAD.nasal oxygen in place  Psych: Normal affect. Neuro: Alert and oriented X 3. Moves all extremities spontaneously. HEENT: Normal  Neck: Supple without bruits or JVD. Lungs:  Mild expiratory wheezing. Heart:  Irregularly irregular rhythm.  No gallop murmur or rub. Abdomen: Soft, non-tender, non-distended, BS + x 4.  Extremities: No clubbing, cyanosis or edema. DP/PT/Radials 2+ and equal bilaterally.  Labs  No results found for this basename: CKTOTAL, CKMB, TROPONINI,  in the last 72 hours Lab Results  Component Value Date   WBC 11.8* 04/17/2013   HGB 10.2* 04/17/2013   HCT 33.8* 04/17/2013   MCV 96.6 04/17/2013   PLT 239 04/17/2013     Recent Labs Lab 04/17/13 0550  NA 142  K 4.3  CL 105  CO2 27  BUN 16  CREATININE 1.05  CALCIUM 7.5*  GLUCOSE 364*   Lab Results  Component Value Date   CHOL 166 08/28/2012   HDL 43 08/28/2012   LDLCALC 91 08/28/2012   TRIG 269* 12/04/2012   Lab Results  Component Value Date   DDIMER 0.32 05/13/2011    Radiology/Studies  Dg Chest Portable 1 View  04/16/2013   CLINICAL DATA:  Shortness of breath, COPD  EXAM: PORTABLE CHEST - 1 VIEW  COMPARISON:  12/05/2012  FINDINGS: Mild cardiomegaly without CHF or definite pneumonia. No collapse or consolidation. Negative for effusion or pneumothorax. Trachea midline.  IMPRESSION: Cardiomegaly without acute process   Electronically Signed   By: Ruel Favors M.D.   On: 04/16/2013 23:59    ECG Atrial fibrillation Anteroseptal infarct, age indeterminate Prolonged QT interval Confirmed by Bebe Shaggy MD,  2-D echo on 05/22/12: - Left ventricle: Poor image quality unable to estimate EF but likely moderately reduced The cavity size was mildly dilated. There was moderate concentric hypertrophy. - Left atrium: The atrium was mildly dilated. - Right  ventricle: The cavity size was mildly dilated. - Right atrium: The atrium was mildly dilated. - Atrial septum: No defect or patent foramen ovale was identified.   ASSESSMENT AND PLAN 1. chronic atrial fibrillation on long-term Coumadin.  Rapid ventricular response. 2. severe COPD, oxygen dependent at home 3. bipolar disorder 4. diabetes mellitus with recent hypoglycemia 5. history of medication noncompliance  Plan: We'll transition him back to oral diltiazem. We will update his echocardiogram and use Definity to evaluate LV systolic function. Continue long-term Coumadin for anticoagulation.   Karie Schwalbe,  04/17/2013, 9:23 AM

## 2013-04-17 NOTE — Progress Notes (Signed)
ANTICOAGULATION CONSULT NOTE - Initial Consult  Pharmacy Consult for Coumadin Indication: atrial fibrillation  Allergies  Allergen Reactions  . Flexeril [Cyclobenzaprine] Other (See Comments)    Alters Mental Status-ALL MUSCLE RELAXERS  . Muscle Relief [Capsaicin]     Hysteria     Patient Measurements: Height: 5\' 8"  (172.7 cm) Weight: 231 lb 0.7 oz (104.8 kg) IBW/kg (Calculated) : 68.4  Vital Signs: Temp: 98.1 F (36.7 C) (01/30 1100) Temp src: Oral (01/30 1100) BP: 126/57 mmHg (01/30 1100) Pulse Rate: 81 (01/30 1100)  Labs:  Recent Labs  04/16/13 2327 04/17/13 0550  HGB 11.5* 10.2*  HCT 37.3* 33.8*  PLT 239 239  LABPROT 18.7* 19.0*  INR 1.61* 1.64*  CREATININE 1.08 1.05    Estimated Creatinine Clearance: 88.9 ml/min (by C-G formula based on Cr of 1.05).   Medical History: Past Medical History  Diagnosis Date  . Hypertension   . Diabetes mellitus   . Bipolar 1 disorder   . Hyperlipidemia   . Atrial fibrillation   . COPD (chronic obstructive pulmonary disease)   . Obstructive sleep apnea   . Chronic diastolic heart failure     Echocardiogram 05/14/11: Mild LVH, EF 50-55%, no wall motion abnormalities, grade 2 diastolic dysfunction.  . Chest pain     Myoview 05/17/11 demonstrated an EF of 61%, small fixed inferoseptal defect consistent with thinning vs prior infarct; no ischemia. - low risk  . Diabetic neuropathy   . Chronic back pain   . History of stroke   . Mental disorder     bipolar  . CHF (congestive heart failure)   . Stroke   . Neuromuscular disorder     diabetic neuropathy  . Chronic respiratory failure with hypoxia 08/08/2012  . Noncompliance with medication regimen   . Tobacco abuse     Medications:  Prescriptions prior to admission  Medication Sig Dispense Refill  . albuterol (PROVENTIL) (5 MG/ML) 0.5% nebulizer solution Take 2.5 mg by nebulization every 6 (six) hours as needed for wheezing or shortness of breath.       . clonazePAM  (KLONOPIN) 0.5 MG tablet Take 1 tablet by mouth 3 (three) times daily as needed. For anxiety      . diazepam (VALIUM) 10 MG tablet Take 1 tablet by mouth daily as needed for anxiety.       Marland Kitchen diltiazem (CARDIZEM) 60 MG tablet Take 60 mg by mouth 2 (two) times daily.      . furosemide (LASIX) 20 MG tablet Take 20 mg by mouth 2 (two) times daily.      Marland Kitchen glimepiride (AMARYL) 2 MG tablet Take 2 mg by mouth daily.      Marland Kitchen HYDROcodone-acetaminophen (NORCO/VICODIN) 5-325 MG per tablet Take 1 tablet by mouth every 6 (six) hours as needed for moderate pain.      Marland Kitchen lisinopril (PRINIVIL,ZESTRIL) 40 MG tablet Take 40 mg by mouth daily.      Marland Kitchen lithium carbonate 300 MG capsule Take 300 mg by mouth 2 (two) times daily with a meal.      . morphine (MS CONTIN) 15 MG 12 hr tablet Take 15 mg by mouth 2 (two) times daily.      . nicotine (NICODERM CQ - DOSED IN MG/24 HOURS) 21 mg/24hr patch Place 21 mg onto the skin daily.      Marland Kitchen nystatin-triamcinolone (MYCOLOG II) cream Apply 1 application topically daily.      Marland Kitchen omeprazole (PRILOSEC) 20 MG capsule Take 20 mg by mouth daily.      Marland Kitchen  potassium chloride (K-DUR) 10 MEQ tablet Take 10 mEq by mouth daily.      Marland Kitchen. warfarin (COUMADIN) 5 MG tablet Take 2.5-5 mg by mouth daily. *alternates between 2.5mg  one day, then 5mg  the next      . insulin aspart (NOVOLOG) 100 UNIT/ML injection Inject 0-20 Units into the skin as needed for high blood sugar (Has a sliding scale at home).        Assessment: 58 y.o. male admitted in afib with RVR. On coumadin PTA for afib. Baseline INR 1.64 (subtherapeutic) on home dose of 2.5mg  alternating with 5mg . Last dose of 2.5mg  taken on 04/15/13 so dose missed yesterday. Hgb low but stable. No bleeding noted.  Goal of Therapy:  INR 2-3 Monitor platelets by anticoagulation protocol: Yes   Plan:  1. Daily INR 2. D/c scheduled coumadin dosing 3. Coumadin 6 mg po today  Christoper Fabianaron Jalisha Enneking, PharmD, BCPS Clinical pharmacist, pager [phone] 04/17/2013,2:38  PM

## 2013-04-17 NOTE — ED Notes (Signed)
Paged MD Onalee Huaavid to see if we can send pt to tele floor on Cardizem drip.

## 2013-04-17 NOTE — Progress Notes (Signed)
CBG at 0800 is 302; Dr. Jerral RalphGhimire paged regarding lab value; no new orders received; will continue to monitor.

## 2013-04-17 NOTE — ED Notes (Signed)
Pt's CBG 269. Matt RN notified

## 2013-04-17 NOTE — Progress Notes (Signed)
  Echocardiogram 2D Echocardiogram has been performed.  Jorje GuildCHUNG, Robertine Kipper 04/17/2013, 2:43 PM

## 2013-04-17 NOTE — Progress Notes (Signed)
[facility]  Patient Status: Active (receiving services up to time of hospitalization)  [facility] is providing the following services: RN, PT and OT  If patient discharges after hours, please call [phone].   Sean Warren 04/17/2013, 11:53 AM

## 2013-04-17 NOTE — ED Provider Notes (Signed)
CSN: [id number]     Arrival date & time 04/16/13  2044 History   First MD Initiated Contact with Patient 04/16/13 2306     Chief Complaint  Patient presents with  . Hypoglycemia    Patient is a 58 y.o. male presenting with shortness of breath. The history is provided by the patient.  Shortness of Breath Severity:  Moderate Onset quality:  Gradual Duration:  3 days Timing:  Intermittent Progression:  Worsening Chronicity:  Recurrent Relieved by:  Position changes Exacerbated by: lying flat. Associated symptoms: cough and wheezing   Associated symptoms: no chest pain, no fever and no syncope   Pt reports he has had increased SOB for past 3 days No CP He reports chronic LE swelling/redness for past month   It is noted that when EMS arrived, CBG was 14 and given D50 He reports he takes glimepiride, took his morning dose but does not take any insulin.  He reports decreased PO intake today  Past Medical History  Diagnosis Date  . Hypertension   . Diabetes mellitus   . Bipolar 1 disorder   . Hyperlipidemia   . Atrial fibrillation   . COPD (chronic obstructive pulmonary disease)   . Obstructive sleep apnea   . Chronic diastolic heart failure     Echocardiogram 05/14/11: Mild LVH, EF 50-55%, no wall motion abnormalities, grade 2 diastolic dysfunction.  . Chest pain     Myoview 05/17/11 demonstrated an EF of 61%, small fixed inferoseptal defect consistent with thinning vs prior infarct; no ischemia. - low risk  . Diabetic neuropathy   . Chronic back pain   . History of stroke   . Mental disorder     bipolar  . CHF (congestive heart failure)   . Stroke   . Neuromuscular disorder     diabetic neuropathy  . Chronic respiratory failure with hypoxia 08/08/2012  . Noncompliance with medication regimen   . Tobacco abuse    Past Surgical History  Procedure Laterality Date  . No past surgeries     No family history on file. History  Substance Use Topics  . Smoking status:  Current Every Day Smoker -- 1.50 packs/day for 29 years    Types: Cigarettes  . Smokeless tobacco: Never Used  . Alcohol Use: No    Review of Systems  Constitutional: Negative for fever.  Respiratory: Positive for cough, shortness of breath and wheezing.   Cardiovascular: Negative for chest pain and syncope.  Gastrointestinal: Positive for diarrhea. Negative for blood in stool.  All other systems reviewed and are negative.    Allergies  Flexeril and Muscle relief  Home Medications   Current Outpatient Rx  Name  Route  Sig  Dispense  Refill  . albuterol (PROVENTIL) (5 MG/ML) 0.5% nebulizer solution   Nebulization   Take 2.5 mg by nebulization every 6 (six) hours as needed for wheezing or shortness of breath.          . clonazePAM (KLONOPIN) 0.5 MG tablet   Oral   Take 1 tablet by mouth 3 (three) times daily as needed. For anxiety         . diazepam (VALIUM) 10 MG tablet   Oral   Take 1 tablet by mouth daily as needed for anxiety.          Marland Kitchen diltiazem (CARDIZEM) 60 MG tablet   Oral   Take 60 mg by mouth 2 (two) times daily.         . furosemide (  LASIX) 20 MG tablet   Oral   Take 20 mg by mouth 2 (two) times daily.         Marland Kitchen. glimepiride (AMARYL) 2 MG tablet   Oral   Take 2 mg by mouth daily.         Marland Kitchen. HYDROcodone-acetaminophen (NORCO/VICODIN) 5-325 MG per tablet   Oral   Take 1 tablet by mouth every 6 (six) hours as needed for moderate pain.         Marland Kitchen. lisinopril (PRINIVIL,ZESTRIL) 40 MG tablet   Oral   Take 40 mg by mouth daily.         Marland Kitchen. lithium carbonate 300 MG capsule   Oral   Take 300 mg by mouth 2 (two) times daily with a meal.         . morphine (MS CONTIN) 15 MG 12 hr tablet   Oral   Take 15 mg by mouth 2 (two) times daily.         . nicotine (NICODERM CQ - DOSED IN MG/24 HOURS) 21 mg/24hr patch   Transdermal   Place 21 mg onto the skin daily.         Marland Kitchen. nystatin-triamcinolone (MYCOLOG II) cream   Topical   Apply 1  application topically daily.         Marland Kitchen. omeprazole (PRILOSEC) 20 MG capsule   Oral   Take 20 mg by mouth daily.         . potassium chloride (K-DUR) 10 MEQ tablet   Oral   Take 10 mEq by mouth daily.         Marland Kitchen. warfarin (COUMADIN) 5 MG tablet   Oral   Take 2.5-5 mg by mouth daily. *alternates between 2.5mg  one day, then 5mg  the next         . insulin aspart (NOVOLOG) 100 UNIT/ML injection   Subcutaneous   Inject 0-20 Units into the skin as needed for high blood sugar (Has a sliding scale at home).          BP 126/83  Pulse 96  Temp(Src) 98.7 F (37.1 C) (Oral)  Resp 20  SpO2 98% Physical Exam CONSTITUTIONAL: Well developed/well nourished HEAD: Normocephalic/atraumatic EYES: EOMI/PERRL ENMT: Mucous membranes moist NECK: supple no meningeal signs SPINE:entire spine nontender CV: S1/S2 noted, no murmurs/rubs/gallops noted LUNGS: wheezing bilaterally no apparent distress, he is on his daily oxygen 2L ABDOMEN: soft, nontender, no rebound or guarding GU:no cva tenderness NEURO: Pt is awake/alert, moves all extremitiesx4 EXTREMITIES: pulses normal, full ROM, symmetric edema to bilateral LE with erythema and 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  Pt reports this is chronic SKIN: warm, color normal PSYCH: no abnormalities of mood noted  ED Course  Procedures (including critical care time) CRITICAL CARE Performed by: Joya GaskinsWICKLINE,Bauer Ausborn W Total critical care time: 33 Critical care time was exclusive of separately billable procedures and treating other patients. Critical care was necessary to treat or prevent imminent or life-threatening deterioration. Critical care was time spent personally by me on the following activities: development of treatment plan with patient and/or surrogate as well as nursing, discussions with consultants, evaluation of patient's response to treatment, examination of patient, obtaining history from patient or  surrogate, ordering and performing treatments and interventions, ordering and review of laboratory studies, ordering and review of radiographic studies, pulse oximetry and re-evaluation of patient's condition.  Labs Review Labs Reviewed  GLUCOSE, CAPILLARY - Abnormal; Notable for the following:    Glucose-Capillary 134 (*)  All other components within normal limits  BASIC METABOLIC PANEL - Abnormal; Notable for the following:    Glucose, Bld 130 (*)    Calcium 8.1 (*)    GFR calc non Af Amer 73 (*)    GFR calc Af Amer 85 (*)    All other components within normal limits  CBC WITH DIFFERENTIAL - Abnormal; Notable for the following:    WBC 11.6 (*)    RBC 3.90 (*)    Hemoglobin 11.5 (*)    HCT 37.3 (*)    RDW 15.9 (*)    Neutrophils Relative % 78 (*)    Neutro Abs 9.1 (*)    All other components within normal limits  PRO B NATRIURETIC PEPTIDE - Abnormal; Notable for the following:    Pro B Natriuretic peptide (BNP) 1419.0 (*)    All other components within normal limits  LITHIUM LEVEL - Abnormal; Notable for the following:    Lithium Lvl 0.41 (*)    All other components within normal limits  PROTIME-INR - Abnormal; Notable for the following:    Prothrombin Time 18.7 (*)    INR 1.61 (*)    All other components within normal limits  GLUCOSE, CAPILLARY - Abnormal; Notable for the following:    Glucose-Capillary 107 (*)    All other components within normal limits   Imaging Review Dg Chest Portable 1 View  04/16/2013   CLINICAL DATA:  Shortness of breath, COPD  EXAM: PORTABLE CHEST - 1 VIEW  COMPARISON:  12/05/2012  FINDINGS: Mild cardiomegaly without CHF or definite pneumonia. No collapse or consolidation. Negative for effusion or pneumothorax. Trachea midline.  IMPRESSION: Cardiomegaly without acute process   Electronically Signed   By: Ruel Favors M.D.   On: 04/16/2013 23:59    EKG Interpretation    Date/Time:  Friday April 17 2013 00:36:15 EST Ventricular Rate:  130 PR  Interval:    QRS Duration: 94 QT Interval:  349 QTC Calculation: 513 R Axis:   82 Text Interpretation:  Atrial fibrillation Anteroseptal infarct, age indeterminate Prolonged QT interval Confirmed by Bebe Shaggy  MD, Devante Capano ([id number]) on 04/17/2013 12:44:06 AM          2:25 AM Pt now in afib with RVR, HR> 130, requiring cardizem drip His glucose has improved He will need admission for better rate control Pt was stabilized in the ER D/w dr Onalee Hua, will admit to stepdown  MDM  No diagnosis found. Nursing notes including past medical history and social history reviewed and considered in documentation xrays reviewed and considered Previous records reviewed and considered Labs/vital reviewed and considered     Joya Gaskins, MD 04/17/13 [phone]

## 2013-04-18 DIAGNOSIS — L03119 Cellulitis of unspecified part of limb: Secondary | ICD-10-CM

## 2013-04-18 DIAGNOSIS — L02419 Cutaneous abscess of limb, unspecified: Secondary | ICD-10-CM

## 2013-04-18 DIAGNOSIS — E119 Type 2 diabetes mellitus without complications: Secondary | ICD-10-CM

## 2013-04-18 LAB — CBC
HCT: 39.3 % (ref 39.0–52.0)
HEMOGLOBIN: 11.4 g/dL — AB (ref 13.0–17.0)
MCH: 29.2 pg (ref 26.0–34.0)
MCHC: 29 g/dL — AB (ref 30.0–36.0)
MCV: 100.5 fL — ABNORMAL HIGH (ref 78.0–100.0)
Platelets: 305 10*3/uL (ref 150–400)
RBC: 3.91 MIL/uL — ABNORMAL LOW (ref 4.22–5.81)
RDW: 16.2 % — ABNORMAL HIGH (ref 11.5–15.5)
WBC: 14.5 10*3/uL — ABNORMAL HIGH (ref 4.0–10.5)

## 2013-04-18 LAB — BASIC METABOLIC PANEL
BUN: 12 mg/dL (ref 6–23)
BUN: 11 mg/dL (ref 6–23)
CALCIUM: 8.6 mg/dL (ref 8.4–10.5)
CHLORIDE: 105 meq/L (ref 96–112)
CO2: 32 meq/L (ref 19–32)
CO2: 31 meq/L (ref 19–32)
CREATININE: 1.13 mg/dL (ref 0.50–1.35)
CREATININE: 0.98 mg/dL (ref 0.50–1.35)
Calcium: 8.2 mg/dL — ABNORMAL LOW (ref 8.4–10.5)
Chloride: 106 mEq/L (ref 96–112)
GFR calc Af Amer: 80 mL/min — ABNORMAL LOW (ref >90)
GFR calc Af Amer: >90 mL/min (ref >90)
GFR calc non Af Amer: 69 mL/min — ABNORMAL LOW (ref >90)
GFR calc non Af Amer: 88 mL/min — ABNORMAL LOW (ref >90)
GLUCOSE: 249 mg/dL — AB (ref 70–99)
GLUCOSE: 161 mg/dL — AB (ref 70–99)
POTASSIUM: 5.5 meq/L — AB (ref 3.7–5.3)
Potassium: 6.1 mEq/L — ABNORMAL HIGH (ref 3.7–5.3)
SODIUM: 144 meq/L (ref 137–147)
Sodium: 142 mEq/L (ref 137–147)

## 2013-04-18 LAB — GLUCOSE, CAPILLARY
GLUCOSE-CAPILLARY: 178 mg/dL — AB (ref 70–99)
Glucose-Capillary: 191 mg/dL — ABNORMAL HIGH (ref 70–99)
Glucose-Capillary: 219 mg/dL — ABNORMAL HIGH (ref 70–99)
Glucose-Capillary: 186 mg/dL — ABNORMAL HIGH (ref 70–99)
Glucose-Capillary: 224 mg/dL — ABNORMAL HIGH (ref 70–99)
Glucose-Capillary: 178 mg/dL — ABNORMAL HIGH (ref 70–99)
Glucose-Capillary: 162 mg/dL — ABNORMAL HIGH (ref 70–99)

## 2013-04-18 LAB — PROTIME-INR
INR: 1.43 (ref 0.00–1.49)
Prothrombin Time: 17.1 seconds — ABNORMAL HIGH (ref 11.6–15.2)

## 2013-04-18 MED ORDER — VANCOMYCIN HCL 10 G IV SOLR
2000.0000 mg | Freq: Once | INTRAVENOUS | Status: AC
  Administered 2013-04-18: 2000 mg via INTRAVENOUS
  Filled 2013-04-18: qty 2000

## 2013-04-18 MED ORDER — SODIUM POLYSTYRENE SULFONATE 15 GM/60ML PO SUSP
30.0000 g | Freq: Once | ORAL | Status: AC
  Administered 2013-04-18: 30 g via ORAL
  Filled 2013-04-18: qty 120

## 2013-04-18 MED ORDER — WARFARIN SODIUM 6 MG PO TABS
6.0000 mg | ORAL_TABLET | Freq: Once | ORAL | Status: AC
  Administered 2013-04-18: 6 mg via ORAL
  Filled 2013-04-18: qty 1

## 2013-04-18 MED ORDER — DILTIAZEM HCL 90 MG PO TABS
90.0000 mg | ORAL_TABLET | Freq: Three times a day (TID) | ORAL | Status: DC
  Administered 2013-04-18 – 2013-04-19 (×3): 90 mg via ORAL
  Filled 2013-04-18 (×6): qty 1

## 2013-04-18 MED ORDER — DEXTROSE 50 % IV SOLN
1.0000 | Freq: Once | INTRAVENOUS | Status: AC
  Administered 2013-04-18: 50 mL via INTRAVENOUS
  Filled 2013-04-18: qty 50

## 2013-04-18 MED ORDER — VANCOMYCIN HCL IN DEXTROSE 1-5 GM/200ML-% IV SOLN
1000.0000 mg | Freq: Two times a day (BID) | INTRAVENOUS | Status: DC
  Administered 2013-04-18 – 2013-04-20 (×4): 1000 mg via INTRAVENOUS
  Filled 2013-04-18 (×5): qty 200

## 2013-04-18 MED ORDER — LORAZEPAM 1 MG PO TABS
1.0000 mg | ORAL_TABLET | Freq: Once | ORAL | Status: AC
  Administered 2013-04-18: 1 mg via ORAL
  Filled 2013-04-18: qty 1

## 2013-04-18 MED ORDER — INSULIN ASPART 100 UNIT/ML ~~LOC~~ SOLN
6.0000 [IU] | Freq: Once | SUBCUTANEOUS | Status: AC
  Administered 2013-04-18: 6 [IU] via INTRAVENOUS

## 2013-04-18 NOTE — Progress Notes (Signed)
PATIENT DETAILS Name: Sean Warren Age: 58 y.o. Sex: male Date of Birth: [birth year] Admit Date: 04/16/2013 Admitting Physician Phillips Grout, MD PJS:[id number],VOPFY, MD  Subjective: Current no complaints, off Cardizem infusion  Assessment/Plan: Principal Problem:   Atrial fibrillation with RVR -patient was admitted, and started on Cardizem infusion. Rate much better, has been transitioned to oral Cardizem. -Cardiology now following. - Also on chronic Coumadin therapy, INR subtherapeutic- been currently dosed by pharmacy  Hypoglycemia - Secondary to poor oral intake, patient uses Amaryl and sliding scale insulin as an outpatient - Allow for some permissive hyper glycemia, check A1c - CBGs now more than 300, start low-dose Lantus and SSI-suspect will require Metformin on discharge.  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 Currently doing much better - On home O2  Chronic respiratory failure - On home O2  Obesity - Counseled regarding importance of weight loss  OSA -CPAP QHS  Tobacco Use -on transdermal Nicotine -counseled extensively  Disposition: Remain inpatient-transfer to Telemetry  DVT Prophylaxis: Not needed as on Coumadin  Code Status: Full code   Family Communication None at bedside  Procedures:  None  CONSULTS:  cardiology  Time spent 40 minutes-which includes 50% of the time with  face-to-face with patient/ family and coordinating care related to the above assessment and plan.  MEDICATIONS: Scheduled Meds: . diltiazem  60 mg Oral Q8H  . furosemide  20 mg Oral BID  . insulin aspart  0-9 Units Subcutaneous TID WC  . insulin glargine  10 Units Subcutaneous Daily  . lithium carbonate  300 mg Oral BID WC  . morphine  15 mg Oral BID  . nicotine  21 mg Transdermal Daily  . pantoprazole  40 mg Oral Daily  . potassium chloride  10 mEq Oral Daily  . sodium chloride  3 mL Intravenous Q12H  . Warfarin - Pharmacist Dosing Inpatient   Does not apply q1800   Continuous Infusions:  PRN Meds:.sodium chloride, albuterol, HYDROcodone-acetaminophen, ondansetron (ZOFRAN) IV, sodium chloride  Antibiotics: Anti-infectives   None       PHYSICAL EXAM: Vital signs in last 24 hours: Filed Vitals:   04/18/13 0400 04/18/13 0500 04/18/13 0600 04/18/13 0700  BP: 112/79  130/55 104/49  Pulse: 105  101 94  Temp: 97.7 F (36.5 C)   97.3 F (36.3 C)  TempSrc: Axillary   Oral  Resp: 22  14 20   Height:      Weight:  105.3 kg (232 lb 2.3 oz)    SpO2: 98%  97% 100%    Weight change: 0.5 kg (1 lb 1.6 oz) Filed Weights   04/17/13 0450 04/18/13 0500  Weight: 104.8 kg (231 lb 0.7 oz) 105.3 kg (232 lb 2.3 oz)   Body mass index is 35.31 kg/(m^2).   Gen Exam: Awake and alert with clear speech.   Neck: Supple, No JVD.   Chest: B/L Clear.- However decreased air entry at bases bilaterally CVS: S1 S2 Regular, no murmurs.  Abdomen: soft, BS +,  non tender, non distended.  Extremities: Chronic erythema with open wounds:Left pretibial: 7cm x 2.5cm x 0.4cm. Right pretibial: 2cm x 1cm x 0.4cm Neurologic: Non Focal.   Skin: No Rash.   Wounds: N/A.   Intake/Output from previous day:  Intake/Output Summary (Last 24 hours) at 04/18/13 0941 Last data filed at 04/18/13 0900  Gross per 24 hour  Intake 1024.42 ml  Output   2950 ml  Net -1925.58 ml     LAB RESULTS: CBC  Recent  Labs Lab 04/16/13 2327 04/17/13 0550 04/18/13 0243  WBC 11.6* 11.8* 14.5*  HGB 11.5* 10.2* 11.4*  HCT 37.3* 33.8* 39.3  PLT 239 239 305  MCV 95.6 96.6 100.5*  MCH 29.5 29.1 29.2  MCHC 30.8 30.2 29.0*  RDW 15.9* 16.0* 16.2*  LYMPHSABS 1.8  --   --   MONOABS 0.5  --   --   EOSABS 0.3  --   --   BASOSABS 0.0  --   --     Chemistries   Recent Labs Lab 04/16/13 2327 04/17/13 0550 04/18/13 0243  NA 146 142 144  K 4.1 4.3 6.1*  CL 106 105 106  CO2 31 27 32  GLUCOSE 130* 364* 249*  BUN 16 16 12   CREATININE 1.08 1.05 1.13  CALCIUM 8.1* 7.5* 8.6    CBG:  Recent Labs Lab 04/17/13 1400 04/17/13 1724 04/17/13 2200 04/18/13 0042 04/18/13 0440  GLUCAP 195* 261* 203* 191* 219*    GFR Estimated Creatinine Clearance: 82.8 ml/min (by C-G formula based on Cr of 1.13).  Coagulation profile  Recent Labs Lab 04/16/13 2327 04/17/13 0550 04/18/13 0243  INR 1.61* 1.64* 1.43    Cardiac Enzymes No results found for this basename: CK, CKMB, TROPONINI, MYOGLOBIN,  in the last 168 hours  No components found with this basename: POCBNP,  No results found for this basename: DDIMER,  in the last 72 hours No results found for this basename: HGBA1C,  in the last 72 hours No results found for this basename: CHOL, HDL, LDLCALC, TRIG, CHOLHDL, LDLDIRECT,  in the last 72 hours No results found for this basename: TSH, T4TOTAL, FREET3, T3FREE, THYROIDAB,  in the last 72 hours No results found for this basename: VITAMINB12, FOLATE, FERRITIN, TIBC, IRON, RETICCTPCT,  in the last 72 hours No results found for this basename: LIPASE, AMYLASE,  in the last 72 hours  Urine Studies No results found for this basename: UACOL, UAPR, USPG, UPH, UTP, UGL, UKET, UBIL, UHGB, UNIT, UROB, ULEU, UEPI, UWBC, URBC, UBAC, CAST, CRYS, UCOM, BILUA,  in the last 72 hours  MICROBIOLOGY: Recent Results (from the past 240 hour(s))  MRSA PCR SCREENING     Status: None   Collection Time    04/17/13  4:49 AM       Result Value Range Status   MRSA by PCR NEGATIVE  NEGATIVE Final   Comment:            The GeneXpert MRSA Assay (FDA     approved for NASAL specimens     only), is one component of a     comprehensive MRSA colonization     surveillance program. It is not     intended to diagnose MRSA     infection nor to guide or     monitor treatment for     MRSA infections.    RADIOLOGY STUDIES/RESULTS: Dg Tibia/fibula Left  04/17/2013   CLINICAL DATA:  Lower extremity cellulitis  EXAM: LEFT TIBIA AND FIBULA -  2 VIEW  COMPARISON:  None.  FINDINGS: AP and lateral views of the left tibia and fibula reveal the bones to be adequately mineralized. There is no lytic or blastic lesion. There is no periosteal reaction. The observed portions of the knee and ankle exhibit no significant degenerative change nor evidence of an acute fracture or dislocation. No soft tissue gas collections are demonstrated. There is irregularity of the surface of the soft tissues over the mid shin consistent with the known cellulitis.  IMPRESSION: There is no plain film evidence of acute osteomyelitis.   Electronically Signed   By: David  Martinique   On: 04/17/2013 10:54   Dg Tibia/fibula Right  04/17/2013   CLINICAL DATA:  Lower extremity cellulitis  EXAM: RIGHT TIBIA AND FIBULA - 2 VIEW  COMPARISON:  None.  FINDINGS: AP and lateral views of the right tibia and fibula reveal the bones to be adequately mineralized. There is no lytic or blastic lesion or periosteal reaction. The observed portions of the knee and ankle reveal no significant degenerative change nor fracture or dislocation. The overlying soft tissues demonstrate mild irregularity over the mid shin anteriorly. There is no soft tissue gas nor foreign body.  IMPRESSION: There is no evidence of acute osteomyelitis.   Electronically Signed   By: David  Martinique   On: 04/17/2013 10:55   Dg Chest Portable 1 View  04/16/2013   CLINICAL DATA:  Shortness of breath, COPD  EXAM: PORTABLE  CHEST - 1 VIEW  COMPARISON:  12/05/2012  FINDINGS: Mild cardiomegaly without CHF or definite pneumonia. No collapse or consolidation. Negative for effusion or pneumothorax. Trachea midline.  IMPRESSION: Cardiomegaly without acute process   Electronically Signed   By: Daryll Brod M.D.   On: 04/16/2013 23:59    Oren Binet, MD  [facility] Pager:[phone]  If 7PM-7AM, please contact night-coverage [email or web address] Password [email or web address] 04/18/2013, 9:41 AM   LOS: 2 days

## 2013-04-18 NOTE — Progress Notes (Signed)
ANTIBIOTIC CONSULT NOTE - INITIAL  Pharmacy Consult for vanc Indication: cellulitis  Allergies  Allergen Reactions  . Flexeril [Cyclobenzaprine] Other (See Comments)    Alters Mental Status-ALL MUSCLE RELAXERS  . Muscle Relief [Capsaicin]     Hysteria     Patient Measurements: Height: 5\' 8"  (172.7 cm) Weight: 232 lb 2.3 oz (105.3 kg) IBW/kg (Calculated) : 68.4   Vital Signs: Temp: 97.3 F (36.3 C) (01/31 0700) Temp src: Oral (01/31 0700) BP: 104/49 mmHg (01/31 0700) Pulse Rate: 94 (01/31 0700) Intake/Output from previous day: 01/30 0701 - 01/31 0700 In: 739.8 [P.O.:720; I.V.:19.8] Out: 2750 [Urine:2750] Intake/Output from this shift: Total I/O In: 300 [P.O.:300] Out: 200 [Urine:200]  Labs:  Recent Labs  04/16/13 2327 04/17/13 0550 04/18/13 0243  WBC 11.6* 11.8* 14.5*  HGB 11.5* 10.2* 11.4*  PLT 239 239 305  CREATININE 1.08 1.05 1.13   Estimated Creatinine Clearance: 82.8 ml/min (by C-G formula based on Cr of 1.13). No results found for this basename: VANCOTROUGH, Leodis BinetVANCOPEAK, VANCORANDOM, GENTTROUGH, GENTPEAK, GENTRANDOM, TOBRATROUGH, TOBRAPEAK, TOBRARND, AMIKACINPEAK, AMIKACINTROU, AMIKACIN,  in the last 72 hours   Microbiology: Recent Results (from the past 720 hour(s))  MRSA PCR SCREENING     Status: None   Collection Time    04/17/13  4:49 AM      Result Value Range Status   MRSA by PCR NEGATIVE  NEGATIVE Final   Comment:            The GeneXpert MRSA Assay (FDA     approved for NASAL specimens     only), is one component of a     comprehensive MRSA colonization     surveillance program. It is not     intended to diagnose MRSA     infection nor to guide or     monitor treatment for     MRSA infections.    Medical History: Past Medical History  Diagnosis Date  . Hypertension   . Diabetes mellitus   . Bipolar 1 disorder   . Hyperlipidemia   . Atrial fibrillation   . COPD (chronic obstructive pulmonary disease)   . Obstructive sleep apnea    . Chronic diastolic heart failure     Echocardiogram 05/14/11: Mild LVH, EF 50-55%, no wall motion abnormalities, grade 2 diastolic dysfunction.  . Chest pain     Myoview 05/17/11 demonstrated an EF of 61%, small fixed inferoseptal defect consistent with thinning vs prior infarct; no ischemia. - low risk  . Diabetic neuropathy   . Chronic back pain   . History of stroke   . Mental disorder     bipolar  . CHF (congestive heart failure)   . Stroke   . Neuromuscular disorder     diabetic neuropathy  . Chronic respiratory failure with hypoxia 08/08/2012  . Noncompliance with medication regimen   . Tobacco abuse     Medications:  Scheduled:  . diltiazem  60 mg Oral Q8H  . furosemide  20 mg Oral BID  . insulin aspart  0-9 Units Subcutaneous TID WC  . insulin glargine  10 Units Subcutaneous Daily  . lithium carbonate  300 mg Oral BID WC  . morphine  15 mg Oral BID  . nicotine  21 mg Transdermal Daily  . pantoprazole  40 mg Oral Daily  . potassium chloride  10 mEq Oral Daily  . sodium chloride  3 mL Intravenous Q12H  . warfarin  6 mg Oral ONCE-1800  . Warfarin - Pharmacist Dosing Inpatient  Does not apply q1800   Assessment: 58 y.o. male admitted in afib with RVR. To start vanc for lower extremity wounds with cellulitis. WBC elev. Renal function stable.  No cultures pending.   Goal of Therapy:  Vancomycin trough level 10-15 mcg/ml  Plan:  Vanc 2 gram load now then 1gram Q12H Follow clinical course  Vanc trough when clinically appropriate   Thank you, Piedad Climes, PharmD Clinical Pharmacist - Resident Pager: [phone] Pharmacy: [phone] 04/18/2013 10:05 AM

## 2013-04-18 NOTE — Progress Notes (Signed)
ANTICOAGULATION CONSULT NOTE - Initial Consult  Pharmacy Consult for Coumadin Indication: atrial fibrillation  Allergies  Allergen Reactions  . Flexeril [Cyclobenzaprine] Other (See Comments)    Alters Mental Status-ALL MUSCLE RELAXERS  . Muscle Relief [Capsaicin]     Hysteria     Patient Measurements: Height: 5\' 8"  (172.7 cm) Weight: 232 lb 2.3 oz (105.3 kg) IBW/kg (Calculated) : 68.4  Vital Signs: Temp: 97.3 F (36.3 C) (01/31 0700) Temp src: Oral (01/31 0700) BP: 104/49 mmHg (01/31 0700) Pulse Rate: 94 (01/31 0700)  Labs:  Recent Labs  04/16/13 2327 04/17/13 0550 04/18/13 0243  HGB 11.5* 10.2* 11.4*  HCT 37.3* 33.8* 39.3  PLT 239 239 305  LABPROT 18.7* 19.0* 17.1*  INR 1.61* 1.64* 1.43  CREATININE 1.08 1.05 1.13    Estimated Creatinine Clearance: 82.8 ml/min (by C-G formula based on Cr of 1.13).   Assessment: 58 y.o. male admitted in afib with RVR. On coumadin PTA for afib. Baseline INR 1.64 (subtherapeutic) on home dose of 2.5mg  alternating with 5mg . Dose of 2.5mg  taken on 04/15/13 and missed dose on 1/29. Hgb low but stable. No bleeding noted.  INR down today likely secondary to missed dose.   Goal of Therapy:  INR 2-3 Monitor platelets by anticoagulation protocol: Yes   Plan:  1. Daily INR 2. Coumadin 6 mg po again today  Thank you, Piedad ClimesKelley Miller, PharmD Clinical Pharmacist - Resident Pager: [phone] Pharmacy: [phone] 04/18/2013 10:02 AM

## 2013-04-18 NOTE — Evaluation (Signed)
Physical Therapy Evaluation Patient Details Name: Sean Warren MRN: [id number] DOB: 07-29-1955 Today's Date: 04/18/2013 Time: 6578-4696 PT Time Calculation (min): 21 min  PT Assessment / Plan / Recommendation History of Present Illness  58 yo male crf on 2 l o2 at home, copd, chf, afib, osa comes in with confusion and sob earlier at home.  ems was called he was found to be in afib with rvr, and a glucose of 14.  Clinical Impression  Pt functioning near baseline. Pt using w/c primarily at home. Pt safe to d/c home with 24/7 assist and con't HHPT and aide services. Acute PT to con't to follow to progress mobility as able.    PT Assessment  Patient needs continued PT services    Follow Up Recommendations  Home health PT;Supervision/Assistance - 24 hour    Does the patient have the potential to tolerate intense rehabilitation      Barriers to Discharge        Equipment Recommendations  None recommended by PT    Recommendations for Other Services     Frequency Min 2X/week    Precautions / Restrictions Precautions Precautions: Fall Restrictions Weight Bearing Restrictions: No   Pertinent Vitals/Pain LE discomfort but didn't rate      Mobility  Bed Mobility Overal bed mobility:  (pt received sitting EOB) Transfers Overall transfer level: Needs assistance Equipment used: Rolling walker (2 wheeled) Transfers: Pharmacologist;Sit to/from Stand Sit to Stand: Min assist Stand pivot transfers: Min assist General transfer comment: pt with good use of RW Ambulation/Gait Ambulation/Gait assistance: Min guard Ambulation Distance (Feet): 2 Feet Assistive device: Rolling walker (2 wheeled) Gait Pattern/deviations: Step-to pattern;Shuffle;Decreased stride length Gait velocity: slow General Gait Details: pt deferred further ambulation at this time due to "I don't ever walk that much."    Exercises     PT Diagnosis: Difficulty walking;Generalized weakness  PT Problem  List: Decreased strength;Decreased activity tolerance;Decreased balance;Decreased mobility PT Treatment Interventions: DME instruction;Gait training;Functional mobility training;Therapeutic activities;Therapeutic exercise     PT Goals(Current goals can be found in the care plan section) Acute Rehab PT Goals Patient Stated Goal: home PT Goal Formulation: With patient Time For Goal Achievement: 04/25/13 Potential to Achieve Goals: Good  Visit Information  Last PT Received On: 04/18/13 Assistance Needed: +1 History of Present Illness: 58 yo male crf on 2 l o2 at home, copd, chf, afib, osa comes in with confusion and sob earlier at home.  ems was called he was found to be in afib with rvr, and a glucose of 14.       Prior Functioning  Home Living Family/patient expects to be discharged to:: Private residence Living Arrangements:  (reports he lives in a trail[place] with friends and has HH servic) Available Help at Discharge: Friend(s);Home health;Available 24 hours/day Type of Home:  ([place]) Home Access:  (pt reports "people carry me in") Home Layout: One level (handicapped accessible) Home Equipment: Walker - 2 wheels;Wheelchair - manual;Shower seat Prior Function Level of Independence: Needs assistance Gait / Transfers Assistance Needed: uses w/c 100% of time, minimal ambulation ADL's / Homemaking Assistance Needed: assist with bathing, dressing. reports he takes himself to the bathroom. can feed self but someone else does the cooking. reports HH to assist with the showering Comments: pt reports "I just sit in my w/c all day. I don't do anything." Communication Communication: No difficulties Dominant Hand: Right    Cognition  Cognition Arousal/Alertness: Awake/alert Behavior During Therapy: WFL for tasks assessed/performed Overall Cognitive Status:  Within Functional Limits for tasks assessed    Extremity/Trunk Assessment Upper Extremity Assessment Upper Extremity Assessment:  Generalized weakness Lower Extremity Assessment Lower Extremity Assessment: Generalized weakness Cervical / Trunk Assessment Cervical / Trunk Assessment: Normal   Balance Balance Overall balance assessment: Needs assistance Sitting-balance support: Feet supported;No upper extremity supported Sitting balance-Leahy Scale: Good Postural control:  (pt unable to maintain cervical extension) Standing balance support: Bilateral upper extremity supported Standing balance-Leahy Scale: Fair Standing balance comment: pt stood x 3 min to perform pericare and wash face . pt with wide base of support and support of L UE General Comments General comments (skin integrity, edema, etc.): pt with bilat LE sores with bilat ace wrap. Pt found incontinent of urine despite pt stating he is aware when he has to have a BM or urinate. ace wraps removed due to being soiled. RN to order new ones.   End of Session PT - End of Session Equipment Utilized During Treatment: Gait belt Activity Tolerance: Patient tolerated treatment well Patient left:  (sitting EOB) Nurse Communication: Mobility status  GP     Marcene BrawnChadwell, Sean Anding Marie 04/18/2013, 1:03 PM  Lewis ShockAshly Chen Saadeh, PT, DPT Pager #: [phone] Office #: [phone]

## 2013-04-18 NOTE — Progress Notes (Signed)
Pt refused CPAP qHS.  Pt currently on 3.5L [place].

## 2013-04-18 NOTE — Progress Notes (Signed)
Patient Name: Sean Warren Date of Encounter: 04/18/2013     Principal Problem:   Atrial fibrillation with RVR Active Problems:   Bipolar disorder   Obesity (BMI 30.0-34.9)   COPD with acute exacerbation   Chronic respiratory failure with hypoxia   Chronic diastolic heart failure   Hypoglycemia   Atrial fibrillation with rapid ventricular response   SOB (shortness of breath)    SUBJECTIVE Patient denies any chest discomfort or increased dyspnea.  He still has a thick cough.  CURRENT MEDS . diltiazem  60 mg Oral Q8H  . furosemide  20 mg Oral BID  . insulin aspart  0-9 Units Subcutaneous TID WC  . insulin glargine  10 Units Subcutaneous Daily  . lithium carbonate  300 mg Oral BID WC  . morphine  15 mg Oral BID  . nicotine  21 mg Transdermal Daily  . pantoprazole  40 mg Oral Daily  . potassium chloride  10 mEq Oral Daily  . sodium chloride  3 mL Intravenous Q12H  . vancomycin  2,000 mg Intravenous Once  . vancomycin  1,000 mg Intravenous Q12H  . warfarin  6 mg Oral ONCE-1800  . Warfarin - Pharmacist Dosing Inpatient   Does not apply q1800    OBJECTIVE  Filed Vitals:   04/18/13 0400 04/18/13 0500 04/18/13 0600 04/18/13 0700  BP: 112/79  130/55 104/49  Pulse: 105  101 94  Temp: 97.7 F (36.5 C)   97.3 F (36.3 C)  TempSrc: Axillary   Oral  Resp: 22  14 20   Height:      Weight:  232 lb 2.3 oz (105.3 kg)    SpO2: 98%  97% 100%    Intake/Output Summary (Last 24 hours) at 04/18/13 1017 Last data filed at 04/18/13 0900  Gross per 24 hour  Intake    720 ml  Output   2650 ml  Net  -1930 ml   Filed Weights   04/17/13 0450 04/18/13 0500  Weight: 231 lb 0.7 oz (104.8 kg) 232 lb 2.3 oz (105.3 kg)    PHYSICAL EXAM General: Pleasant, NAD.nasal oxygen in place  Psych: Normal affect.  Neuro: Alert and oriented X 3. Moves all extremities spontaneously.  HEENT: Normal  Neck: Supple without bruits or JVD.  Lungs: Mild expiratory wheezing.  Heart: Irregularly  irregular rhythm. No gallop murmur or rub.  Abdomen: Soft, non-tender, non-distended, BS + x 4.  Extremities: No clubbing, cyanosis or edema. DP/PT/Radials 2+ and equal bilaterally.   Accessory Clinical Findings  CBC  Recent Labs  04/16/13 2327 04/17/13 0550 04/18/13 0243  WBC 11.6* 11.8* 14.5*  NEUTROABS 9.1*  --   --   HGB 11.5* 10.2* 11.4*  HCT 37.3* 33.8* 39.3  MCV 95.6 96.6 100.5*  PLT 239 239 305   Basic Metabolic Panel  Recent Labs  04/17/13 0550 04/18/13 0243  NA 142 144  K 4.3 6.1*  CL 105 106  CO2 27 32  GLUCOSE 364* 249*  BUN 16 12  CREATININE 1.05 1.13  CALCIUM 7.5* 8.6   Liver Function Tests No results found for this basename: AST, ALT, ALKPHOS, BILITOT, PROT, ALBUMIN,  in the last 72 hours No results found for this basename: LIPASE, AMYLASE,  in the last 72 hours Cardiac Enzymes No results found for this basename: CKTOTAL, CKMB, CKMBINDEX, TROPONINI,  in the last 72 hours BNP No components found with this basename: POCBNP,  D-Dimer No results found for this basename: DDIMER,  in the last 72  hours Hemoglobin A1C No results found for this basename: HGBA1C,  in the last 72 hours Fasting Lipid Panel No results found for this basename: CHOL, HDL, LDLCALC, TRIG, CHOLHDL, LDLDIRECT,  in the last 72 hours Thyroid Function Tests No results found for this basename: TSH, T4TOTAL, FREET3, T3FREE, THYROIDAB,  in the last 72 hours  TELE  Telemetry shows atrial fibrillation with controlled ventricular response at rest.  ECG 2-D echo on 04/17/13: Study Conclusions  - Left ventricle: The cavity size was normal. Wall thickness was increased in a pattern of mild LVH. Systolic function was normal. The estimated ejection fraction was in the range of 55% to 60%. Wall motion was normal; there were no regional wall motion abnormalities. - Left atrium: The atrium was mildly dilated. - Right atrium: The atrium was moderately dilated.   Radiology/Studies  Dg  Tibia/fibula Left  04/17/2013   CLINICAL DATA:  Lower extremity cellulitis  EXAM: LEFT TIBIA AND FIBULA - 2 VIEW  COMPARISON:  None.  FINDINGS: AP and lateral views of the left tibia and fibula reveal the bones to be adequately mineralized. There is no lytic or blastic lesion. There is no periosteal reaction. The observed portions of the knee and ankle exhibit no significant degenerative change nor evidence of an acute fracture or dislocation. No soft tissue gas collections are demonstrated. There is irregularity of the surface of the soft tissues over the mid shin consistent with the known cellulitis.  IMPRESSION: There is no plain film evidence of acute osteomyelitis.   Electronically Signed   By: David  [place]   On: 04/17/2013 10:54   Dg Tibia/fibula Right  04/17/2013   CLINICAL DATA:  Lower extremity cellulitis  EXAM: RIGHT TIBIA AND FIBULA - 2 VIEW  COMPARISON:  None.  FINDINGS: AP and lateral views of the right tibia and fibula reveal the bones to be adequately mineralized. There is no lytic or blastic lesion or periosteal reaction. The observed portions of the knee and ankle reveal no significant degenerative change nor fracture or dislocation. The overlying soft tissues demonstrate mild irregularity over the mid shin anteriorly. There is no soft tissue gas nor foreign body.  IMPRESSION: There is no evidence of acute osteomyelitis.   Electronically Signed   By: David  [place]   On: 04/17/2013 10:55   Dg Chest Portable 1 View  04/16/2013   CLINICAL DATA:  Shortness of breath, COPD  EXAM: PORTABLE CHEST - 1 VIEW  COMPARISON:  12/05/2012  FINDINGS: Mild cardiomegaly without CHF or definite pneumonia. No collapse or consolidation. Negative for effusion or pneumothorax. Trachea midline.  IMPRESSION: Cardiomegaly without acute process   Electronically Signed   By: Ruel Favorsrevor  Shick M.D.   On: 04/16/2013 23:59    ASSESSMENT AND PLAN 1. chronic atrial fibrillation on long-term Coumadin. Rapid ventricular  response.  2. severe COPD, oxygen dependent at home  3. bipolar disorder  4. diabetes mellitus with recent hypoglycemia  5. history of medication noncompliance  Plan: Will increase dose of oral diltiazem up to 90 mg every8 hours.  Signed, Cassell Clementhomas Eliceo Gladu MD

## 2013-04-19 DIAGNOSIS — I5033 Acute on chronic diastolic (congestive) heart failure: Secondary | ICD-10-CM

## 2013-04-19 DIAGNOSIS — I509 Heart failure, unspecified: Secondary | ICD-10-CM

## 2013-04-19 LAB — BASIC METABOLIC PANEL
BUN: 11 mg/dL (ref 6–23)
CO2: 30 mEq/L (ref 19–32)
CREATININE: 0.88 mg/dL (ref 0.50–1.35)
Calcium: 8.5 mg/dL (ref 8.4–10.5)
Chloride: 100 mEq/L (ref 96–112)
Glucose, Bld: 123 mg/dL — ABNORMAL HIGH (ref 70–99)
Potassium: 4.4 mEq/L (ref 3.7–5.3)
Sodium: 142 mEq/L (ref 137–147)

## 2013-04-19 LAB — CBC
HCT: 36.5 % — ABNORMAL LOW (ref 39.0–52.0)
HEMOGLOBIN: 10.5 g/dL — AB (ref 13.0–17.0)
MCH: 28.6 pg (ref 26.0–34.0)
MCHC: 28.8 g/dL — ABNORMAL LOW (ref 30.0–36.0)
MCV: 99.5 fL (ref 78.0–100.0)
PLATELETS: 233 10*3/uL (ref 150–400)
RBC: 3.67 MIL/uL — AB (ref 4.22–5.81)
RDW: 15.8 % — ABNORMAL HIGH (ref 11.5–15.5)
WBC: 11.5 10*3/uL — AB (ref 4.0–10.5)

## 2013-04-19 LAB — GLUCOSE, CAPILLARY
GLUCOSE-CAPILLARY: 211 mg/dL — AB (ref 70–99)
Glucose-Capillary: 125 mg/dL — ABNORMAL HIGH (ref 70–99)
Glucose-Capillary: 164 mg/dL — ABNORMAL HIGH (ref 70–99)
Glucose-Capillary: 133 mg/dL — ABNORMAL HIGH (ref 70–99)

## 2013-04-19 LAB — PROTIME-INR
INR: 1.44 (ref 0.00–1.49)
PROTHROMBIN TIME: 17.2 s — AB (ref 11.6–15.2)

## 2013-04-19 MED ORDER — LITHIUM CARBONATE 300 MG PO CAPS
300.0000 mg | ORAL_CAPSULE | Freq: Two times a day (BID) | ORAL | Status: DC
  Administered 2013-04-19 – 2013-04-20 (×4): 300 mg via ORAL
  Filled 2013-04-19 (×5): qty 1

## 2013-04-19 MED ORDER — LORAZEPAM 1 MG PO TABS
1.0000 mg | ORAL_TABLET | Freq: Once | ORAL | Status: AC
  Administered 2013-04-19: 1 mg via ORAL
  Filled 2013-04-19: qty 1

## 2013-04-19 MED ORDER — PNEUMOCOCCAL VAC POLYVALENT 25 MCG/0.5ML IJ INJ
0.5000 mL | INJECTION | INTRAMUSCULAR | Status: AC
  Administered 2013-04-20: 0.5 mL via INTRAMUSCULAR
  Filled 2013-04-19: qty 0.5

## 2013-04-19 MED ORDER — INSULIN ASPART 100 UNIT/ML ~~LOC~~ SOLN
0.0000 [IU] | Freq: Three times a day (TID) | SUBCUTANEOUS | Status: DC
  Administered 2013-04-19 (×2): 1 [IU] via SUBCUTANEOUS
  Administered 2013-04-19: 2 [IU] via SUBCUTANEOUS
  Administered 2013-04-20 (×2): 1 [IU] via SUBCUTANEOUS
  Administered 2013-04-20: 2 [IU] via SUBCUTANEOUS

## 2013-04-19 MED ORDER — LORAZEPAM 2 MG/ML IJ SOLN: 0.5000 mg | Freq: Once | INTRAMUSCULAR | Status: DC

## 2013-04-19 MED ORDER — WARFARIN SODIUM 6 MG PO TABS
6.0000 mg | ORAL_TABLET | Freq: Once | ORAL | Status: AC
  Administered 2013-04-19: 6 mg via ORAL
  Filled 2013-04-19: qty 1

## 2013-04-19 MED ORDER — DILTIAZEM HCL ER COATED BEADS 300 MG PO CP24
300.0000 mg | ORAL_CAPSULE | Freq: Every day | ORAL | Status: DC
  Administered 2013-04-19 – 2013-04-20 (×2): 300 mg via ORAL
  Filled 2013-04-19 (×2): qty 1

## 2013-04-19 NOTE — Progress Notes (Signed)
Patient Name: Sean Warren Date of Encounter: 04/19/2013     Principal Problem:   Atrial fibrillation with RVR Active Problems:   Bipolar disorder   Obesity (BMI 30.0-34.9)   COPD with acute exacerbation   Chronic respiratory failure with hypoxia   Chronic diastolic heart failure   Hypoglycemia   Atrial fibrillation with rapid ventricular response   SOB (shortness of breath)    SUBJECTIVE  Patient has no complaints today. No chest pain. No worsening of chronic dyspnea.  CURRENT MEDS . diltiazem  90 mg Oral Q8H  . furosemide  20 mg Oral BID  . insulin aspart  0-9 Units Subcutaneous TID WC  . insulin glargine  10 Units Subcutaneous Daily  . lithium carbonate  300 mg Oral BID WC  . morphine  15 mg Oral BID  . nicotine  21 mg Transdermal Daily  . pantoprazole  40 mg Oral Daily  . potassium chloride  10 mEq Oral Daily  . sodium chloride  3 mL Intravenous Q12H  . vancomycin  1,000 mg Intravenous Q12H  . Warfarin - Pharmacist Dosing Inpatient   Does not apply q1800    OBJECTIVE  Filed Vitals:   04/18/13 1342 04/18/13 2100 04/18/13 2213 04/19/13 0535  BP: 157/90  148/83 151/73  Pulse: 88  85 123  Temp: 98.3 F (36.8 C)  98.3 F (36.8 C) 97.5 F (36.4 C)  TempSrc: Oral  Oral Oral  Resp: 20 20  22   Height:      Weight:    232 lb 12.9 oz (105.6 kg)  SpO2: 98%  96% 96%    Intake/Output Summary (Last 24 hours) at 04/19/13 1018 Last data filed at 04/19/13 0320  Gross per 24 hour  Intake      0 ml  Output   1000 ml  Net  -1000 ml   Filed Weights   04/17/13 0450 04/18/13 0500 04/19/13 0535  Weight: 231 lb 0.7 oz (104.8 kg) 232 lb 2.3 oz (105.3 kg) 232 lb 12.9 oz (105.6 kg)    PHYSICAL EXAM  General: Pleasant, NAD.nasal oxygen in place  Psych: Normal affect.  Neuro: Alert and oriented X 3. Moves all extremities spontaneously.  HEENT: Normal  Neck: Supple without bruits or JVD.  Lungs: Mild expiratory wheezing.  Heart: Irregularly irregular rhythm. No  gallop murmur or rub.  Abdomen: Soft, non-tender, non-distended, BS + x 4.  Extremities: No clubbing, cyanosis or edema. DP/PT/Radials 2+ and equal bilaterally.   Accessory Clinical Findings  CBC  Recent Labs  04/16/13 2327  04/18/13 0243 04/19/13 0615  WBC 11.6*  < > 14.5* 11.5*  NEUTROABS 9.1*  --   --   --   HGB 11.5*  < > 11.4* 10.5*  HCT 37.3*  < > 39.3 36.5*  MCV 95.6  < > 100.5* 99.5  PLT 239  < > 305 233  < > = values in this interval not displayed. Basic Metabolic Panel  Recent Labs  04/18/13 1522 04/19/13 0615  NA 142 142  K 5.5* 4.4  CL 105 100  CO2 31 30  GLUCOSE 161* 123*  BUN 11 11  CREATININE 0.98 0.88  CALCIUM 8.2* 8.5   Liver Function Tests No results found for this basename: AST, ALT, ALKPHOS, BILITOT, PROT, ALBUMIN,  in the last 72 hours No results found for this basename: LIPASE, AMYLASE,  in the last 72 hours Cardiac Enzymes No results found for this basename: CKTOTAL, CKMB, CKMBINDEX, TROPONINI,  in the last  72 hours BNP No components found with this basename: POCBNP,  D-Dimer No results found for this basename: DDIMER,  in the last 72 hours Hemoglobin A1C No results found for this basename: HGBA1C,  in the last 72 hours Fasting Lipid Panel No results found for this basename: CHOL, HDL, LDLCALC, TRIG, CHOLHDL, LDLDIRECT,  in the last 72 hours Thyroid Function Tests No results found for this basename: TSH, T4TOTAL, FREET3, T3FREE, THYROIDAB,  in the last 72 hours  TELE  Atrial fibrillation with rate increased into 120s at times.    Radiology/Studies  Dg Tibia/fibula Left  04/17/2013   CLINICAL DATA:  Lower extremity cellulitis  EXAM: LEFT TIBIA AND FIBULA - 2 VIEW  COMPARISON:  None.  FINDINGS: AP and lateral views of the left tibia and fibula reveal the bones to be adequately mineralized. There is no lytic or blastic lesion. There is no periosteal reaction. The observed portions of the knee and ankle exhibit no significant  degenerative change nor evidence of an acute fracture or dislocation. No soft tissue gas collections are demonstrated. There is irregularity of the surface of the soft tissues over the mid shin consistent with the known cellulitis.  IMPRESSION: There is no plain film evidence of acute osteomyelitis.   Electronically Signed   By: David  [place]   On: 04/17/2013 10:54   Dg Tibia/fibula Right  04/17/2013   CLINICAL DATA:  Lower extremity cellulitis  EXAM: RIGHT TIBIA AND FIBULA - 2 VIEW  COMPARISON:  None.  FINDINGS: AP and lateral views of the right tibia and fibula reveal the bones to be adequately mineralized. There is no lytic or blastic lesion or periosteal reaction. The observed portions of the knee and ankle reveal no significant degenerative change nor fracture or dislocation. The overlying soft tissues demonstrate mild irregularity over the mid shin anteriorly. There is no soft tissue gas nor foreign body.  IMPRESSION: There is no evidence of acute osteomyelitis.   Electronically Signed   By: David  [place]   On: 04/17/2013 10:55   Dg Chest Portable 1 View  04/16/2013   CLINICAL DATA:  Shortness of breath, COPD  EXAM: PORTABLE CHEST - 1 VIEW  COMPARISON:  12/05/2012  FINDINGS: Mild cardiomegaly without CHF or definite pneumonia. No collapse or consolidation. Negative for effusion or pneumothorax. Trachea midline.  IMPRESSION: Cardiomegaly without acute process   Electronically Signed   By: Ruel Favorsrevor  Shick M.D.   On: 04/16/2013 23:59    ASSESSMENT AND PLAN 1. chronic atrial fibrillation on long-term Coumadin. Rapid ventricular response.  2. severe COPD, oxygen dependent at home  3. bipolar disorder  4. diabetes mellitus with recent hypoglycemia  5. history of medication noncompliance   Plan: Will switch diltiazem to long-acting form to improve outpatient compliance.    Signed, Cassell Clementhomas Makita Blow MD

## 2013-04-19 NOTE — Progress Notes (Signed)
PATIENT DETAILS Name: Sean Warren Age: 58 y.o. Sex: male Date of Birth: [birth year] Admit Date: 04/16/2013 Admitting Physician Phillips Grout, MD YQM:GNOIB,BCWUG, MD  Subjective: Current no complaints.  Assessment/Plan: Principal Problem:   Atrial fibrillation with RVR -patient was admitted, and started on Cardizem infusion. Rate much better, has been transitioned to oral Cardizem- cardiology recommending extended release Cardizem. - Also on chronic Coumadin therapy, INR subtherapeutic- been currently dosed by pharmacy  Hypoglycemia - Secondary to poor oral intake, patient uses Amaryl and sliding scale insulin as an outpatient - Allow for some permissive hyper glycemia, check A1c - CBGs are relatively well controlled with, continue low-dose Lantus and SSI-suspect will require Metformin on discharge. We'll not resume Amaryl on discharge.  Hyperkalemia -? 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  Bipolar disorder -Currently stable, continue with lithium- levels subtherapeutic on admission  COPD with mild exacerbation - Lungs clear on exam - Continue with nebs, since only minimal exacerbation on admission not started on steroids. Currently doing much better - On home O2  Chronic respiratory failure - On home O2  Obesity - Counseled regarding importance of weight loss  OSA -CPAP QHS  Tobacco Use -on transdermal Nicotine -counseled extensively  Disposition: Remain inpatient- home with home services in the next 1-2 days  DVT Prophylaxis: Not needed as  on Coumadin  Code Status: Full code   Family Communication None at bedside  Procedures:  None  CONSULTS:  cardiology  MEDICATIONS: Scheduled Meds: . diltiazem  300 mg Oral Daily  . furosemide  20 mg Oral BID  . insulin aspart  0-9 Units Subcutaneous TID WC  . insulin glargine  10 Units Subcutaneous Daily  . lithium carbonate  300 mg Oral BID WC  . morphine  15 mg Oral BID  . nicotine  21 mg Transdermal Daily  . pantoprazole  40 mg Oral Daily  . potassium chloride  10 mEq Oral Daily  . sodium chloride  3 mL Intravenous Q12H  . vancomycin  1,000 mg Intravenous Q12H  . Warfarin - Pharmacist Dosing Inpatient   Does not apply q1800   Continuous Infusions:  PRN Meds:.sodium chloride, albuterol, HYDROcodone-acetaminophen, ondansetron (ZOFRAN) IV, sodium chloride  Antibiotics: Anti-infectives   Start     Dose/Rate Route Frequency Ordered Stop   04/18/13 2300  vancomycin (VANCOCIN) IVPB 1000 mg/200 mL premix     1,000 mg 200 mL/hr over 60 Minutes Intravenous Every 12 hours 04/18/13 1009     04/18/13 1100  vancomycin (VANCOCIN) 2,000 mg in sodium chloride 0.9 % 500 mL IVPB     2,000 mg 250 mL/hr over 120 Minutes Intravenous  Once 04/18/13 1008 04/18/13 1432       PHYSICAL EXAM: Vital signs in last 24 hours: Filed Vitals:   04/18/13 2100 04/18/13 2213 04/19/13 0535 04/19/13 1113  BP:  148/83 151/73 119/66  Pulse:  85 123   Temp:  98.3 F (36.8 C) 97.5 F (36.4 C)   TempSrc:  Oral Oral   Resp: 20  22   Height:      Weight:   105.6 kg (232 lb  12.9 oz)   SpO2:  96% 96%     Weight change: 0.3 kg (10.6 oz) Filed Weights   04/17/13 0450 04/18/13 0500 04/19/13 0535  Weight: 104.8 kg (231 lb 0.7 oz) 105.3 kg (232 lb 2.3 oz) 105.6 kg (232 lb 12.9 oz)   Body mass index is 35.41 kg/(m^2).   Gen Exam: Awake and alert with clear speech.   Neck: Supple, No JVD.   Chest: B/L Clear. CVS: S1 S2 Regular, no murmurs.  Abdomen: soft, BS +, non tender, non distended.    Extremities: Chronic erythema with open wounds:Left pretibial: 7cm x 2.5cm x 0.4cm. Right pretibial: 2cm x 1cm x 0.4cm Neurologic: Non Focal.   Skin: No Rash.   Wounds: N/A.   Intake/Output from previous day:  Intake/Output Summary (Last 24 hours) at 04/19/13 1155 Last data filed at 04/19/13 0320  Gross per 24 hour  Intake      0 ml  Output   1000 ml  Net  -1000 ml     LAB RESULTS: CBC  Recent Labs Lab 04/16/13 2327 04/17/13 0550 04/18/13 0243 04/19/13 0615  WBC 11.6* 11.8* 14.5* 11.5*  HGB 11.5* 10.2* 11.4* 10.5*  HCT 37.3* 33.8* 39.3 36.5*  PLT 239 239 305 233  MCV 95.6 96.6 100.5* 99.5  MCH 29.5 29.1 29.2 28.6  MCHC 30.8 30.2 29.0* 28.8*  RDW 15.9* 16.0* 16.2* 15.8*  LYMPHSABS 1.8  --   --   --   MONOABS 0.5  --   --   --   EOSABS 0.3  --   --   --   BASOSABS 0.0  --   --   --     Chemistries   Recent Labs Lab 04/16/13 2327 04/17/13 0550 04/18/13 0243 04/18/13 1522 04/19/13 0615  NA 146 142 144 142 142  K 4.1 4.3 6.1* 5.5* 4.4  CL 106 105 106 105 100  CO2 31 27 32 31 30  GLUCOSE 130* 364* 249* 161* 123*  BUN 16 16 12 11 11   CREATININE 1.08 1.05 1.13 0.98 0.88  CALCIUM 8.1* 7.5* 8.6 8.2* 8.5    CBG:  Recent Labs Lab 04/18/13 1006 04/18/13 1158 04/18/13 1645 04/18/13 2219 04/19/13 0759  GLUCAP 186* 178* 178* 162* 125*    GFR Estimated Creatinine Clearance: 106.5 ml/min (by C-G formula based on Cr of 0.88).  Coagulation profile  Recent Labs Lab 04/16/13 2327 04/17/13 0550 04/18/13 0243 04/19/13 0615  INR 1.61* 1.64* 1.43 1.44    Cardiac Enzymes No results found for this basename: CK, CKMB, TROPONINI, MYOGLOBIN,  in the last 168 hours  No components found with this basename: POCBNP,  No results found for this basename: DDIMER,  in the last 72 hours No results found for this basename: HGBA1C,  in the last 72 hours No results found for this basename: CHOL, HDL, LDLCALC, TRIG, CHOLHDL, LDLDIRECT,  in the last 72 hours No results  found for this basename: TSH, T4TOTAL, FREET3, T3FREE, THYROIDAB,  in the last 72 hours No results found for this basename: VITAMINB12, FOLATE, FERRITIN, TIBC, IRON, RETICCTPCT,  in the last 72 hours No results found for this basename: LIPASE, AMYLASE,  in the last 72 hours  Urine Studies No results found for this basename: UACOL, UAPR, USPG, UPH, UTP, UGL, UKET, UBIL, UHGB, UNIT, UROB, ULEU, UEPI, UWBC, URBC, UBAC, CAST, CRYS, UCOM, BILUA,  in the last 72 hours  MICROBIOLOGY: Recent Results (from the past 240 hour(s))  MRSA PCR SCREENING  Status: None   Collection Time    04/17/13  4:49 AM      Result Value Range Status   MRSA by PCR NEGATIVE  NEGATIVE Final   Comment:            The GeneXpert MRSA Assay (FDA     approved for NASAL specimens     only), is one component of a     comprehensive MRSA colonization     surveillance program. It is not     intended to diagnose MRSA     infection nor to guide or     monitor treatment for     MRSA infections.    RADIOLOGY STUDIES/RESULTS: Dg Tibia/fibula Left  04/17/2013   CLINICAL DATA:  Lower extremity cellulitis  EXAM: LEFT TIBIA AND FIBULA - 2 VIEW  COMPARISON:  None.  FINDINGS: AP and lateral views of the left tibia and fibula reveal the bones to be adequately mineralized. There is no lytic or blastic lesion. There is no periosteal reaction. The observed portions of the knee and ankle exhibit no significant degenerative change nor evidence of an acute fracture or dislocation. No soft tissue gas collections are demonstrated. There is irregularity of the surface of the soft tissues over the mid shin consistent with the known cellulitis.  IMPRESSION: There is no plain film evidence of acute osteomyelitis.   Electronically Signed   By: David  Martinique   On: 04/17/2013 10:54   Dg Tibia/fibula Right  04/17/2013   CLINICAL DATA:  Lower extremity cellulitis  EXAM: RIGHT TIBIA AND FIBULA - 2 VIEW  COMPARISON:  None.  FINDINGS: AP and lateral views  of the right tibia and fibula reveal the bones to be adequately mineralized. There is no lytic or blastic lesion or periosteal reaction. The observed portions of the knee and ankle reveal no significant degenerative change nor fracture or dislocation. The overlying soft tissues demonstrate mild irregularity over the mid shin anteriorly. There is no soft tissue gas nor foreign body.  IMPRESSION: There is no evidence of acute osteomyelitis.   Electronically Signed   By: David  Martinique   On: 04/17/2013 10:55   Dg Chest Portable 1 View  04/16/2013   CLINICAL DATA:  Shortness of breath, COPD  EXAM: PORTABLE CHEST - 1 VIEW  COMPARISON:  12/05/2012  FINDINGS: Mild cardiomegaly without CHF or definite pneumonia. No collapse or consolidation. Negative for effusion or pneumothorax. Trachea midline.  IMPRESSION: Cardiomegaly without acute process   Electronically Signed   By: Daryll Brod M.D.   On: 04/16/2013 23:59    Oren Binet, MD  [facility] Pager:[phone]  If 7PM-7AM, please contact night-coverage [email or web address] Password [email or web address] 04/19/2013, 11:55 AM   LOS: 3 days

## 2013-04-19 NOTE — Progress Notes (Signed)
ANTICOAGULATION CONSULT NOTE - Initial Consult  Pharmacy Consult for Coumadin Indication: atrial fibrillation  Allergies  Allergen Reactions  . Flexeril [Cyclobenzaprine] Other (See Comments)    Alters Mental Status-ALL MUSCLE RELAXERS  . Muscle Relief [Capsaicin]     Hysteria     Patient Measurements: Height: 5\' 8"  (172.7 cm) Weight: 232 lb 12.9 oz (105.6 kg) IBW/kg (Calculated) : 68.4  Vital Signs: Temp: 97.5 F (36.4 C) (02/01 0535) Temp src: Oral (02/01 0535) BP: 119/66 mmHg (02/01 1113) Pulse Rate: 123 (02/01 0535)  Labs:  Recent Labs  04/17/13 0550 04/18/13 0243 04/18/13 1522 04/19/13 0615  HGB 10.2* 11.4*  --  10.5*  HCT 33.8* 39.3  --  36.5*  PLT 239 305  --  233  LABPROT 19.0* 17.1*  --  17.2*  INR 1.64* 1.43  --  1.44  CREATININE 1.05 1.13 0.98 0.88    Estimated Creatinine Clearance: 106.5 ml/min (by C-G formula based on Cr of 0.88).   Assessment: 58 y.o. male admitted in afib with RVR. On coumadin PTA for afib. Baseline INR 1.64 (subtherapeutic) on home dose of 2.5mg  alternating with 5mg . Dose of 2.5mg  taken on 04/15/13 and missed dose on 1/29. INR 1.44 today.  Dose did get charted last PM.  Goal of Therapy:  INR 2-3 Monitor platelets by anticoagulation protocol: Yes   Plan:   Coumadin 6mg  PO x1 Daily INR

## 2013-04-19 NOTE — Progress Notes (Signed)
Pt refuses CPAP qHS. 

## 2013-04-20 DIAGNOSIS — J9620 Acute and chronic respiratory failure, unspecified whether with hypoxia or hypercapnia: Secondary | ICD-10-CM

## 2013-04-20 LAB — BASIC METABOLIC PANEL
BUN: 12 mg/dL (ref 6–23)
CALCIUM: 8.9 mg/dL (ref 8.4–10.5)
CHLORIDE: 104 meq/L (ref 96–112)
CO2: 33 meq/L — AB (ref 19–32)
Creatinine, Ser: 0.87 mg/dL (ref 0.50–1.35)
GFR calc Af Amer: >90 mL/min (ref >90)
GFR calc non Af Amer: >90 mL/min (ref >90)
Glucose, Bld: 145 mg/dL — ABNORMAL HIGH (ref 70–99)
Potassium: 4.1 mEq/L (ref 3.7–5.3)
Sodium: 146 mEq/L (ref 137–147)

## 2013-04-20 LAB — PROTIME-INR
INR: 1.75 — AB (ref 0.00–1.49)
Prothrombin Time: 19.9 seconds — ABNORMAL HIGH (ref 11.6–15.2)

## 2013-04-20 LAB — GLUCOSE, CAPILLARY
GLUCOSE-CAPILLARY: 150 mg/dL — AB (ref 70–99)
Glucose-Capillary: 146 mg/dL — ABNORMAL HIGH (ref 70–99)
Glucose-Capillary: 175 mg/dL — ABNORMAL HIGH (ref 70–99)

## 2013-04-20 MED ORDER — WARFARIN SODIUM 6 MG PO TABS: 6.0000 mg | ORAL_TABLET | Freq: Once | ORAL | Status: DC

## 2013-04-20 MED ORDER — WARFARIN SODIUM 5 MG PO TABS: 5.0000 mg | ORAL_TABLET | Freq: Every day | ORAL | Status: AC

## 2013-04-20 MED ORDER — DOXYCYCLINE HYCLATE 50 MG PO CAPS: 100.0000 mg | ORAL_CAPSULE | Freq: Two times a day (BID) | ORAL | Status: DC

## 2013-04-20 MED ORDER — WARFARIN SODIUM 5 MG PO TABS
5.0000 mg | ORAL_TABLET | Freq: Every day | ORAL | Status: AC
  Administered 2013-04-20: 5 mg via ORAL
  Filled 2013-04-20: qty 1

## 2013-04-20 MED ORDER — HYDROCODONE-ACETAMINOPHEN 5-325 MG PO TABS: 1.0000 | ORAL_TABLET | Freq: Four times a day (QID) | ORAL | Status: AC | PRN

## 2013-04-20 MED ORDER — INSULIN GLARGINE 100 UNIT/ML ~~LOC~~ SOLN: 10.0000 [IU] | Freq: Every day | SUBCUTANEOUS | Status: DC

## 2013-04-20 MED ORDER — MORPHINE SULFATE ER 15 MG PO TBCR: 15.0000 mg | EXTENDED_RELEASE_TABLET | Freq: Two times a day (BID) | ORAL | Status: AC

## 2013-04-20 MED ORDER — DOXYCYCLINE HYCLATE 100 MG PO TABS
100.0000 mg | ORAL_TABLET | Freq: Two times a day (BID) | ORAL | Status: DC
  Administered 2013-04-20: 100 mg via ORAL
  Filled 2013-04-20 (×2): qty 1

## 2013-04-20 MED ORDER — DILTIAZEM HCL ER COATED BEADS 300 MG PO CP24: 300.0000 mg | ORAL_CAPSULE | Freq: Every day | ORAL | Status: DC

## 2013-04-20 MED ORDER — CLONAZEPAM 0.5 MG PO TABS
0.5000 mg | ORAL_TABLET | Freq: Two times a day (BID) | ORAL | Status: DC | PRN
  Administered 2013-04-20: 0.5 mg via ORAL
  Filled 2013-04-20: qty 1

## 2013-04-20 MED ORDER — DILTIAZEM HCL ER COATED BEADS 300 MG PO CP24: 300.0000 mg | ORAL_CAPSULE | Freq: Every day | ORAL | Status: AC

## 2013-04-20 MED ORDER — CLONAZEPAM 0.5 MG PO TABS: 0.5000 mg | ORAL_TABLET | Freq: Two times a day (BID) | ORAL | Status: AC | PRN

## 2013-04-20 MED ORDER — DIAZEPAM 5 MG PO TABS: 10.0000 mg | ORAL_TABLET | Freq: Every day | ORAL | Status: DC | PRN

## 2013-04-20 MED ORDER — DIAZEPAM 10 MG PO TABS: 10.0000 mg | ORAL_TABLET | Freq: Every day | ORAL | Status: DC | PRN

## 2013-04-20 MED ORDER — WARFARIN SODIUM 5 MG PO TABS: 2.5000 mg | ORAL_TABLET | Freq: Every day | ORAL | Status: DC

## 2013-04-20 MED ORDER — WARFARIN SODIUM 5 MG PO TABS: 5.0000 mg | ORAL_TABLET | Freq: Every day | ORAL | Status: DC

## 2013-04-20 MED ORDER — INSULIN ASPART 100 UNIT/ML ~~LOC~~ SOLN: 0.0000 [IU] | Freq: Three times a day (TID) | SUBCUTANEOUS | Status: DC

## 2013-04-20 NOTE — Progress Notes (Signed)
Patient ID: Sean Warren, male   DOB: 06/21/1955, 58 y.o.   MRN: [id number]   Patient Name: Sean Warren Date of Encounter: 04/20/2013     Principal Problem:   Atrial fibrillation with RVR Active Problems:   Bipolar disorder   Obesity (BMI 30.0-34.9)   COPD with acute exacerbation   Chronic respiratory failure with hypoxia   Chronic diastolic heart failure   Hypoglycemia   Atrial fibrillation with rapid ventricular response   SOB (shortness of breath)    SUBJECTIVE  Patient has no complaints today. No chest pain. No worsening of chronic dyspnea.   CURRENT MEDS . diltiazem  300 mg Oral Daily  . furosemide  20 mg Oral BID  . insulin aspart  0-9 Units Subcutaneous TID WC  . insulin glargine  10 Units Subcutaneous Daily  . lithium carbonate  300 mg Oral BID WC  . morphine  15 mg Oral BID  . nicotine  21 mg Transdermal Daily  . pantoprazole  40 mg Oral Daily  . pneumococcal 23 valent vaccine  0.5 mL Intramuscular Tomorrow-1000  . potassium chloride  10 mEq Oral Daily  . sodium chloride  3 mL Intravenous Q12H  . vancomycin  1,000 mg Intravenous Q12H  . Warfarin - Pharmacist Dosing Inpatient   Does not apply q1800    OBJECTIVE  Filed Vitals:   04/19/13 1416 04/19/13 1956 04/19/13 2343 04/20/13 0402  BP: 128/66 118/73  143/79  Pulse: 96 93  98  Temp: 97.8 F (36.6 C) 98.6 F (37 C)  98.5 F (36.9 C)  TempSrc: Oral Oral  Oral  Resp: 20 20    Height:      Weight:    229 lb 11.5 oz (104.2 kg)  SpO2: 97% 92% 93% 92%    Intake/Output Summary (Last 24 hours) at 04/20/13 0837 Last data filed at 04/20/13 0331  Gross per 24 hour  Intake      0 ml  Output    200 ml  Net   -200 ml   Filed Weights   04/18/13 0500 04/19/13 0535 04/20/13 0402  Weight: 232 lb 2.3 oz (105.3 kg) 232 lb 12.9 oz (105.6 kg) 229 lb 11.5 oz (104.2 kg)    PHYSICAL EXAM  General: Pleasant, NAD.nasal oxygen in place  Tremulous  Psych: Normal affect.  Neuro: Alert and oriented X 3. Moves  all extremities spontaneously.  HEENT: Normal  Neck: Supple without bruits or JVD.  Lungs: Mild expiratory wheezing.  Heart: Irregularly irregular rhythm. No gallop murmur or rub.  Abdomen: Soft, non-tender, non-distended, BS + x 4.  Extremities: Plus one edema bilaterally with ACE wraps and erythema    Accessory Clinical Findings  CBC  Recent Labs  04/18/13 0243 04/19/13 0615  WBC 14.5* 11.5*  HGB 11.4* 10.5*  HCT 39.3 36.5*  MCV 100.5* 99.5  PLT 305 233   Basic Metabolic Panel  Recent Labs  04/18/13 1522 04/19/13 0615  NA 142 142  K 5.5* 4.4  CL 105 100  CO2 31 30  GLUCOSE 161* 123*  BUN 11 11  CREATININE 0.98 0.88  CALCIUM 8.2* 8.5    TELE  Atrial fibrillation with rate increased into 100  at times.    Radiology/Studies   Dg Chest Portable 1 View  04/16/2013   CLINICAL DATA:  Shortness of breath, COPD  EXAM: PORTABLE CHEST - 1 VIEW  COMPARISON:  12/05/2012  FINDINGS: Mild cardiomegaly without CHF or definite pneumonia. No collapse or consolidation. Negative for  effusion or pneumothorax. Trachea midline.  IMPRESSION: Cardiomegaly without acute process   Electronically Signed   By: Ruel Favorsrevor  Shick M.D.   On: 04/16/2013 23:59    ASSESSMENT AND PLAN 1. chronic atrial fibrillation on long-term Coumadin. Rapid ventricular response.  2. severe COPD, oxygen dependent at home  3. bipolar disorder  4. diabetes mellitus with recent hypoglycemia  5. history of medication noncompliance   Plan: On [place] cardizem now  His rate will always be a bit high due to oxygen dependant COPD With wheezing would not add beta blocker Can add digoxin in future if needed.  He checks his INR at home and sends results to Dr Mikeal HawthorneGarba.  INR subRx Pharmacy dosing    Not much to add will sign off   Signed, Charlton HawsPeter Hagen Tidd MD

## 2013-04-20 NOTE — Progress Notes (Signed)
Patient had became very anxious, causing himself to hyperventilate. States he is very worked up and is unable to breathe. Provider on call was notified. Provider Claiborne Billingsallahan gave order for ativan PO. This was administered along with a breathing treatment and was very effective. Patient is laying in bed comfortably. Will continue to monitor.  Tanor Glaspy J. Lendell CapriceSullivan RN

## 2013-04-20 NOTE — Discharge Instructions (Signed)

## 2013-04-20 NOTE — Discharge Summary (Addendum)
Physician Discharge Summary  YEDIDYA DUDDY ZOX:[id number] DOB: 01-10-1956 DOA: 04/16/2013  PCP: [name], MD  Admit date: 04/16/2013 Discharge date: 04/20/2013  Time spent: 45 minutes  ADDENDUM:  Just prior to discharge the patient opted not to go to SNF, but rather to go home instead.  Patient will be discharged to home with home health services.  We request a PT / INR be drawn on 2/4 and faxed to Dr. Mikeal Hawthorne.   Recommendations for Outpatient Follow-up:  INR check on 2/4.  Patient has been sub therapeutic.  D/C to home on 5 Mg daily. Patient will need wound care for bilateral lower extremities.  Specific instructions are below under wound care. Follow up with Cardiology in 2 weeks. CBC / BMET / Lithium level in 1 week. Monitor CBGs closely.   Discharge Diagnoses:  Principal Problem:   Atrial fibrillation with RVR Active Problems:   Bipolar disorder   Obesity (BMI 30.0-34.9)   COPD with acute exacerbation   Chronic respiratory failure with hypoxia   Chronic diastolic heart failure   Hypoglycemia   Atrial fibrillation with rapid ventricular response   SOB (shortness of breath)   Discharge Condition: stable.  Diet recommendation: carb modified  Filed Weights   04/18/13 0500 04/19/13 0535 04/20/13 0402  Weight: 105.3 kg (232 lb 2.3 oz) 105.6 kg (232 lb 12.9 oz) 104.2 kg (229 lb 11.5 oz)    History of present illness at the time of admission:  58 yo male chronic renal failure on 2 l o2 at home, copd, chf, afib, osa comes in with confusion and sob. EMS found him to be in afib with rvr, and a glucose of 14. Given d50 and his confusion improved rapidly with correction of his hypoglycemia. He also had some mild wheezing so was given some albuterol. Pt is now back to his baseline breathing status and mental status is back to normal, but still in afib w rvr. He denies recent fevers, n/v/d. No coughing. No le edema or swelling. He has some chronic wounds which have had no change  recently.   Hospital Course:   Atrial fibrillation with RVR  -patient was admitted, and started on Cardizem infusion. His rate improved.  He was transitioned to oral Cardizem then to extended release Cardizem.  - Also on chronic Coumadin therapy, INR subtherapeutic -Will be discharged on 5 mg daily.  INR to be checked 2/3 [facility] cardiology, if his rate becomes uncontrolled in the future, to add a digoxin. Given severe COPD ovoid beta blockers.  Hypoglycemia  - Secondary to poor oral intake, patient uses Amaryl and sliding scale insulin as an outpatient  - Hgb A1C is 7.2 - CBGs are relatively well controlled inpatient will discharge on metformin and sliding scale insulin - discontinue amaryl.  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  Will d/c on 10 days of doxycycline. - The patient his legs have been unchanged for approximately one year  - x-rays of bilateral legs-does not show any osteomyelitis  - Patient claims he goes to a wound care clinic, we'll defer further evaluation/investigations at this time.  Wound Care Consultation    "absorptive dressing as a wound contact layer and with the chronicity of the wounds, that dressing is impregnated with a silver additive (silver hydrofiber, Aquacel Ag+, [facility] [place] [phone]). Nursing to apply once daily, then  cover with a soft silicone foam. To address edema without firm compression -  bilateral ACE wraps from toe to knee (metatarsal head to patellar notch) along with elevation of the extremities as tolerated."   Bipolar disorder  -Currently stable, continue with lithium- levels subtherapeutic on admission  -Continue lithium 300 mg bid with Lithium level check in 1 week. -Bipolar appears stable at this time.  COPD with mild exacerbation  - Lungs clear on  exam  - Continue with nebs, since only minimal exacerbation on admission not started on steroids. Currently doing much better  - On home O2   Chronic respiratory failure  - On home O2   Obesity  - Counseled regarding importance of weight loss   OSA  -CPAP QHS   Tobacco Use  -on transdermal Nicotine  -counseled extensively   Discharge Exam: Filed Vitals:   04/20/13 1352  BP: 130/82  Pulse: 97  Temp: 98.4 F (36.9 C)  Resp: 20    General: obese male with generalized tremor, sitting on the side of the bed. Neck: supple & without JVD Cardiovascular: regular without m/r/g Respiratory: clear without accessory muscle movement.  On oxygen nasal canula. Abdomen:  Soft, nt, nd, +bs, no appreciable masses Extremities:  Chronic erythema with open wounds:Left pretibial: 7cm x 2.5cm x 0.4cm. Right pretibial: 2cm x 1cm x 0.4cm   Discharge Instructions      Discharge Orders   Future Appointments Provider Department Dept Phone   04/22/2013 3:00 PM [facility] [phone]   05/06/2013 3:30 PM Wendall StadePeter C Nishan, MD [facility] [address] [facility] [phone]   Future Orders Complete By Expires   Diet - low sodium heart healthy  As directed    Increase activity slowly  As directed        Medication List    STOP taking these medications       diltiazem 60 MG tablet  Commonly known as:  CARDIZEM     glimepiride 2 MG tablet  Commonly known as:  AMARYL     lisinopril 40 MG tablet  Commonly known as:  PRINIVIL,ZESTRIL      TAKE these medications       albuterol (5 MG/ML) 0.5% nebulizer solution  Commonly known as:  PROVENTIL  Take 2.5 mg by nebulization every 6 (six) hours as needed for wheezing or shortness of breath.     clonazePAM 0.5 MG tablet  Commonly known as:  KLONOPIN  Take 1 tablet (0.5 mg total) by mouth 2 (two) times daily as needed. For anxiety     diazepam 10 MG tablet  Commonly known as:  VALIUM  Take  1 tablet (10 mg total) by mouth daily as needed for anxiety.     diltiazem 300 MG 24 hr capsule  Commonly known as:  CARDIZEM CD  Take 1 capsule (300 mg total) by mouth daily.     doxycycline 50 MG capsule  Commonly known as:  VIBRAMYCIN  Take 2 capsules (100 mg total) by mouth 2 (two) times daily.     furosemide 20 MG tablet  Commonly known as:  LASIX  Take 20 mg by mouth 2 (two) times daily.     HYDROcodone-acetaminophen 5-325 MG per tablet  Commonly known as:  NORCO/VICODIN  Take 1 tablet by mouth every 6 (six) hours as needed for moderate pain.     insulin aspart 100 UNIT/ML injection  Commonly known as:  novoLOG  Inject 0-20 Units into  the skin as needed for high blood sugar (Has a sliding scale at home).     lithium carbonate 300 MG capsule  Take 300 mg by mouth 2 (two) times daily with a meal.     morphine 15 MG 12 hr tablet  Commonly known as:  MS CONTIN  Take 1 tablet (15 mg total) by mouth 2 (two) times daily.     nicotine 21 mg/24hr patch  Commonly known as:  NICODERM CQ - dosed in mg/24 hours  Place 21 mg onto the skin daily.     nystatin-triamcinolone cream  Commonly known as:  MYCOLOG II  Apply 1 application topically daily.     omeprazole 20 MG capsule  Commonly known as:  PRILOSEC  Take 20 mg by mouth daily.     potassium chloride 10 MEQ tablet  Commonly known as:  K-DUR  Take 10 mEq by mouth daily.     warfarin 5 MG tablet  Commonly known as:  COUMADIN  Take 1 tablet (5 mg total) by mouth daily.  Start taking on:  04/21/2013       Allergies  Allergen Reactions  . Flexeril [Cyclobenzaprine] Other (See Comments)    Alters Mental Status-ALL MUSCLE RELAXERS  . Muscle Relief [Capsaicin]     Hysteria    Follow-up Information   Follow up with Charlton Haws, MD. Schedule an appointment as soon as possible for a visit on 05/06/2013. ( APPOINTMENT TIME IS AT 330PM)    Specialty:  Cardiology   Contact information:   [address] [place] [address] [phone]        The results of significant diagnostics from this hospitalization (including imaging, microbiology, ancillary and laboratory) are listed below for reference.    Significant Diagnostic Studies: Dg Tibia/fibula Left  04/17/2013   CLINICAL DATA:  Lower extremity cellulitis  EXAM: LEFT TIBIA AND FIBULA - 2 VIEW  COMPARISON:  None.  FINDINGS: AP and lateral views of the left tibia and fibula reveal the bones to be adequately mineralized. There is no lytic or blastic lesion. There is no periosteal reaction. The observed portions of the knee and ankle exhibit no significant degenerative change nor evidence of an acute fracture or dislocation. No soft tissue gas collections are demonstrated. There is irregularity of the surface of the soft tissues over the mid shin consistent with the known cellulitis.  IMPRESSION: There is no plain film evidence of acute osteomyelitis.   Electronically Signed   By: David  [place]   On: 04/17/2013 10:54   Dg Tibia/fibula Right  04/17/2013   CLINICAL DATA:  Lower extremity cellulitis  EXAM: RIGHT TIBIA AND FIBULA - 2 VIEW  COMPARISON:  None.  FINDINGS: AP and lateral views of the right tibia and fibula reveal the bones to be adequately mineralized. There is no lytic or blastic lesion or periosteal reaction. The observed portions of the knee and ankle reveal no significant degenerative change nor fracture or dislocation. The overlying soft tissues demonstrate mild irregularity over the mid shin anteriorly. There is no soft tissue gas nor foreign body.  IMPRESSION: There is no evidence of acute osteomyelitis.   Electronically Signed   By: David  [place]   On: 04/17/2013 10:55   Dg Chest Portable 1 View  04/16/2013   CLINICAL DATA:  Shortness of breath, COPD  EXAM: PORTABLE CHEST - 1 VIEW  COMPARISON:  12/05/2012  FINDINGS: Mild cardiomegaly without CHF or definite pneumonia. No collapse or consolidation. Negative for  effusion or  pneumothorax. Trachea midline.  IMPRESSION: Cardiomegaly without acute process   Electronically Signed   By: Ruel Favors M.D.   On: 04/16/2013 23:59    Microbiology: Recent Results (from the past 240 hour(s))  MRSA PCR SCREENING     Status: None   Collection Time    04/17/13  4:49 AM      Result Value Range Status   MRSA by PCR NEGATIVE  NEGATIVE Final   Comment:            The GeneXpert MRSA Assay (FDA     approved for NASAL specimens     only), is one component of a     comprehensive MRSA colonization     surveillance program. It is not     intended to diagnose MRSA     infection nor to guide or     monitor treatment for     MRSA infections.     Labs: Basic Metabolic Panel:  Recent Labs Lab 04/17/13 0550 04/18/13 0243 04/18/13 1522 04/19/13 0615 04/20/13 0810  NA 142 144 142 142 146  K 4.3 6.1* 5.5* 4.4 4.1  CL 105 106 105 100 104  CO2 27 32 31 30 33*  GLUCOSE 364* 249* 161* 123* 145*  BUN 16 12 11 11 12   CREATININE 1.05 1.13 0.98 0.88 0.87  CALCIUM 7.5* 8.6 8.2* 8.5 8.9   CBC:  Recent Labs Lab 04/16/13 2327 04/17/13 0550 04/18/13 0243 04/19/13 0615  WBC 11.6* 11.8* 14.5* 11.5*  NEUTROABS 9.1*  --   --   --   HGB 11.5* 10.2* 11.4* 10.5*  HCT 37.3* 33.8* 39.3 36.5*  MCV 95.6 96.6 100.5* 99.5  PLT 239 239 305 233   BNP: BNP (last 3 results)  Recent Labs  11/27/12 0924 02/08/13 1154 04/16/13 2327  PROBNP 2577.0* 3938.0* 1419.0*   CBG:  Recent Labs Lab 04/19/13 1217 04/19/13 1634 04/19/13 2300 04/20/13 0757 04/20/13 1216  GLUCAP 164* 133* 211* 146* 150*    Signed:  Conley Canal [phone]  [facility] 04/20/2013, 2:36 PM  Attending - Patient seen and examined, agree with the above assessment and plan. He is doing well and is stable to discharge to skilled nursing facility.  Windell Norfolk MD  Addendum-now wants to go home, will arrange home health services.  S Ghimire md

## 2013-04-20 NOTE — Care Management Note (Signed)
    Page 1 of 2   04/20/2013     3:49:40 PM   CARE MANAGEMENT NOTE 04/20/2013  Patient:  Sean Warren,Sean Warren   Account Number:  [id number]  Date Initiated:  04/17/2013  Documentation initiated by:  [facility] LLCBROWN,SARAH  Subjective/Objective Assessment:   Found down - AMS.  Afib with RVR and hyperglycemic.     Action/Plan:   pt eval- rec snf- pt refuses , wants to go home with hh services.   Anticipated DC Date:  04/20/2013   Anticipated DC Plan:  HOME W HOME HEALTH SERVICES      DC Planning Services  CM consult      [facility]   Choice offered to / List presented to:  C-1 Patient        HH arranged  HH-1 RN  HH-2 PT  HH-4 NURSE'S AIDE  HH-6 SOCIAL WORKER      HH agency  [facility]   Status of service:  Completed, signed off Medicare Important Message given?   (If response is "NO", the following Medicare IM given date fields will be blank) Date Medicare IM given:   Date Additional Medicare IM given:    Discharge Disposition:  HOME W HOME HEALTH SERVICES  Per UR Regulation:  Reviewed for med. necessity/level of care/duration of stay  If discussed at Long Length of Stay Meetings, dates discussed:    Comments:  ContactJunita Push:  [place],Lisa Sister     [phone]  04/20/13 15:46 Letha Capeeborah Takyra Cantrall RNl, BSN [phone] patient to dc home with hh services [facility], PT, aide and Social worker.  [facility], Debbie notified. Soc will begin 24-48 hrs post discharge.  Patient has transportation home.  Patient has home oxygen, he states his ride home will be bringing his oxygen tank to hospital to go home with.  04-17-13 8:44am Avie ArenasSarah Brown, RNBSN [phone]- [phone] Per prior admissions has had [facility] f/u in past.

## 2013-04-20 NOTE — Clinical Social Work Note (Signed)
CSW worked patient up for SNF admission, patient has now decided to return home. CSW asked if patient needed transportation. He states that he has someone coming to get him. CSW signing off.  Roddie McBryant Kanishk Stroebel, DentonLCSWA, [place], [phone]

## 2013-04-20 NOTE — Progress Notes (Signed)
ANTICOAGULATION CONSULT NOTE - Follow Up Consult  Pharmacy Consult for Coumadin Indication: atrial fibrillation  Allergies  Allergen Reactions  . Flexeril [Cyclobenzaprine] Other (See Comments)    Alters Mental Status-ALL MUSCLE RELAXERS  . Muscle Relief [Capsaicin]     Hysteria     Patient Measurements: Height: 5\' 8"  (172.7 cm) Weight: 229 lb 11.5 oz (104.2 kg) IBW/kg (Calculated) : 68.4 Heparin Dosing Weight:   Vital Signs: Temp: 98.5 F (36.9 C) (02/02 0402) Temp src: Oral (02/02 0402) BP: 143/79 mmHg (02/02 0402) Pulse Rate: 98 (02/02 0402)  Labs:  Recent Labs  04/18/13 0243 04/18/13 1522 04/19/13 0615 04/20/13 0810  HGB 11.4*  --  10.5*  --   HCT 39.3  --  36.5*  --   PLT 305  --  233  --   LABPROT 17.1*  --  17.2* 19.9*  INR 1.43  --  1.44 1.75*  CREATININE 1.13 0.98 0.88 0.87    Estimated Creatinine Clearance: 106.9 ml/min (by C-G formula based on Cr of 0.87).   Medications:  Scheduled:  . diltiazem  300 mg Oral Daily  . doxycycline  100 mg Oral Q12H  . furosemide  20 mg Oral BID  . insulin aspart  0-9 Units Subcutaneous TID WC  . insulin glargine  10 Units Subcutaneous Daily  . lithium carbonate  300 mg Oral BID WC  . morphine  15 mg Oral BID  . nicotine  21 mg Transdermal Daily  . pantoprazole  40 mg Oral Daily  . potassium chloride  10 mEq Oral Daily  . sodium chloride  3 mL Intravenous Q12H  . Warfarin - Pharmacist Dosing Inpatient   Does not apply q1800    Assessment: 58yo male with AFib, admitted with subtherapeutic INR on home dose of 2.5mg  alternating daily with 5mg .  INR now rising, 1.75 this AM.  No bleeding problems noted.  He is to go home today  Goal of Therapy:  INR 2-3 Monitor platelets by anticoagulation protocol: Yes   Plan:  1-  Coumadin 5mg  daily 2-  Recommend next INR on Wed  Marisue HumbleKendra Evelina Lore, PharmD Clinical Pharmacist [facility] 

## 2013-04-22 ENCOUNTER — Encounter (HOSPITAL_BASED_OUTPATIENT_CLINIC_OR_DEPARTMENT_OTHER): Payer: Medicare Other | Attending: Internal Medicine

## 2013-05-06 ENCOUNTER — Encounter: Payer: Medicare Other | Admitting: Cardiovascular Disease

## 2013-05-11 ENCOUNTER — Encounter (HOSPITAL_BASED_OUTPATIENT_CLINIC_OR_DEPARTMENT_OTHER): Payer: Medicare Other | Attending: General Surgery

## 2013-05-17 ENCOUNTER — Emergency Department (HOSPITAL_COMMUNITY)
Admission: EM | Admit: 2013-05-17 | Discharge: 2013-05-17 | Disposition: A | Discharge status: Home / Self Care | Payer: Medicare Other | Attending: Emergency Medicine | Admitting: Emergency Medicine

## 2013-05-17 ENCOUNTER — Encounter (HOSPITAL_COMMUNITY): Payer: Self-pay | Admitting: Emergency Medicine

## 2013-05-17 ENCOUNTER — Emergency Department (HOSPITAL_COMMUNITY): Payer: Medicare Other

## 2013-05-17 DIAGNOSIS — E1142 Type 2 diabetes mellitus with diabetic polyneuropathy: Secondary | ICD-10-CM | POA: Insufficient documentation

## 2013-05-17 DIAGNOSIS — Z91199 Patient's noncompliance with other medical treatment and regimen due to unspecified reason: Secondary | ICD-10-CM | POA: Insufficient documentation

## 2013-05-17 DIAGNOSIS — F17200 Nicotine dependence, unspecified, uncomplicated: Secondary | ICD-10-CM | POA: Insufficient documentation

## 2013-05-17 DIAGNOSIS — G8929 Other chronic pain: Secondary | ICD-10-CM | POA: Insufficient documentation

## 2013-05-17 DIAGNOSIS — F319 Bipolar disorder, unspecified: Secondary | ICD-10-CM | POA: Insufficient documentation

## 2013-05-17 DIAGNOSIS — F411 Generalized anxiety disorder: Secondary | ICD-10-CM | POA: Insufficient documentation

## 2013-05-17 DIAGNOSIS — J441 Chronic obstructive pulmonary disease with (acute) exacerbation: Secondary | ICD-10-CM | POA: Insufficient documentation

## 2013-05-17 DIAGNOSIS — I509 Heart failure, unspecified: Secondary | ICD-10-CM | POA: Insufficient documentation

## 2013-05-17 DIAGNOSIS — Z8701 Personal history of pneumonia (recurrent): Secondary | ICD-10-CM | POA: Insufficient documentation

## 2013-05-17 DIAGNOSIS — I10 Essential (primary) hypertension: Secondary | ICD-10-CM | POA: Insufficient documentation

## 2013-05-17 DIAGNOSIS — Z79899 Other long term (current) drug therapy: Secondary | ICD-10-CM | POA: Insufficient documentation

## 2013-05-17 DIAGNOSIS — Z794 Long term (current) use of insulin: Secondary | ICD-10-CM | POA: Insufficient documentation

## 2013-05-17 DIAGNOSIS — Z9981 Dependence on supplemental oxygen: Secondary | ICD-10-CM | POA: Insufficient documentation

## 2013-05-17 DIAGNOSIS — R609 Edema, unspecified: Secondary | ICD-10-CM | POA: Insufficient documentation

## 2013-05-17 DIAGNOSIS — I5032 Chronic diastolic (congestive) heart failure: Secondary | ICD-10-CM | POA: Insufficient documentation

## 2013-05-17 DIAGNOSIS — Z9119 Patient's noncompliance with other medical treatment and regimen: Secondary | ICD-10-CM | POA: Insufficient documentation

## 2013-05-17 DIAGNOSIS — M7989 Other specified soft tissue disorders: Secondary | ICD-10-CM | POA: Insufficient documentation

## 2013-05-17 DIAGNOSIS — G4733 Obstructive sleep apnea (adult) (pediatric): Secondary | ICD-10-CM | POA: Insufficient documentation

## 2013-05-17 DIAGNOSIS — I4891 Unspecified atrial fibrillation: Secondary | ICD-10-CM | POA: Insufficient documentation

## 2013-05-17 DIAGNOSIS — F419 Anxiety disorder, unspecified: Secondary | ICD-10-CM

## 2013-05-17 DIAGNOSIS — R0600 Dyspnea, unspecified: Secondary | ICD-10-CM

## 2013-05-17 DIAGNOSIS — Z8673 Personal history of transient ischemic attack (TIA), and cerebral infarction without residual deficits: Secondary | ICD-10-CM | POA: Insufficient documentation

## 2013-05-17 DIAGNOSIS — Z7901 Long term (current) use of anticoagulants: Secondary | ICD-10-CM | POA: Insufficient documentation

## 2013-05-17 DIAGNOSIS — Z792 Long term (current) use of antibiotics: Secondary | ICD-10-CM | POA: Insufficient documentation

## 2013-05-17 DIAGNOSIS — E1149 Type 2 diabetes mellitus with other diabetic neurological complication: Secondary | ICD-10-CM | POA: Insufficient documentation

## 2013-05-17 LAB — CBC WITH DIFFERENTIAL/PLATELET
BASOS ABS: 0.1 10*3/uL (ref 0.0–0.1)
Basophils Relative: 1 % (ref 0–1)
EOS ABS: 0.1 10*3/uL (ref 0.0–0.7)
Eosinophils Relative: 1 % (ref 0–5)
HEMATOCRIT: 35.6 % — AB (ref 39.0–52.0)
Hemoglobin: 11.1 g/dL — ABNORMAL LOW (ref 13.0–17.0)
Lymphocytes Relative: 20 % (ref 12–46)
Lymphs Abs: 1.5 10*3/uL (ref 0.7–4.0)
MCH: 28.6 pg (ref 26.0–34.0)
MCHC: 31.2 g/dL (ref 30.0–36.0)
MCV: 91.8 fL (ref 78.0–100.0)
MONOS PCT: 8 % (ref 3–12)
Monocytes Absolute: 0.6 10*3/uL (ref 0.1–1.0)
NEUTROS PCT: 70 % (ref 43–77)
Neutro Abs: 5.3 10*3/uL (ref 1.7–7.7)
PLATELETS: 265 10*3/uL (ref 150–400)
RBC: 3.88 MIL/uL — ABNORMAL LOW (ref 4.22–5.81)
RDW: 16.3 % — AB (ref 11.5–15.5)
WBC: 7.6 10*3/uL (ref 4.0–10.5)

## 2013-05-17 LAB — BASIC METABOLIC PANEL
BUN: 19 mg/dL (ref 6–23)
CO2: 30 mEq/L (ref 19–32)
Calcium: 9.1 mg/dL (ref 8.4–10.5)
Chloride: 100 mEq/L (ref 96–112)
Creatinine, Ser: 1.23 mg/dL (ref 0.50–1.35)
GFR, EST AFRICAN AMERICAN: 73 mL/min — AB (ref >90)
GFR, EST NON AFRICAN AMERICAN: 63 mL/min — AB (ref >90)
Glucose, Bld: 132 mg/dL — ABNORMAL HIGH (ref 70–99)
POTASSIUM: 4.4 meq/L (ref 3.7–5.3)
SODIUM: 140 meq/L (ref 137–147)

## 2013-05-17 LAB — PRO B NATRIURETIC PEPTIDE: PRO B NATRI PEPTIDE: 1454 pg/mL — AB (ref 0–125)

## 2013-05-17 LAB — PROTIME-INR
INR: 2.73 — ABNORMAL HIGH (ref 0.00–1.49)
PROTHROMBIN TIME: 28 s — AB (ref 11.6–15.2)

## 2013-05-17 LAB — TROPONIN I: Troponin I: <0.3 ng/mL (ref <0.30)

## 2013-05-17 MED ORDER — LORAZEPAM 1 MG PO TABS
1.0000 mg | ORAL_TABLET | Freq: Once | ORAL | Status: AC
  Administered 2013-05-17: 1 mg via ORAL
  Filled 2013-05-17: qty 1

## 2013-05-17 NOTE — ED Notes (Signed)
Received report on pt, pt lying semi fowler position, denies any complaints at present time,

## 2013-05-17 NOTE — ED Provider Notes (Signed)
CSN: [id number]     Arrival date & time 05/17/13  [id number] History  This chart was scribed for Sean B. Bernette MayersSheldon, MD by Sean Warren, ED scribe.  This patient was seen in room APA16A/APA16A and the patient's care was started at 9:40 AM.   Chief Complaint  Patient presents with  . Shortness of Breath    The history is provided by the patient. No language interpreter was used.    HPI Comments: Sean Warren is a 58 y.o. male with h/o COPD, CHF, A-fib, OSA, chronic respiratory failure, DM, HTN, hyperlipidemia who presents to the Emergency Department complaining of SOB that began yesterday.  [facility] and reports that his physician there diagnosed him with pneumonia yesterday and started him on antibiotics which he began taking yesterday.  He denies receiving a CXR.  He reports recent worsening leg swelling, worsening exertional SOB, worsening orthopnea and a small amount of vomiting over the past week.  He states he has been sleeping propped up.  He normally pushes himself in a wheelchair and uses a walker at baseline and states he has become more SOB than usual when exerting himself in this way.  He denies productive cough or CP.  Pt is on home oxygen most of the time.   Past Medical History  Diagnosis Date  . Hypertension   . Diabetes mellitus   . Bipolar 1 disorder   . Hyperlipidemia   . Atrial fibrillation   . COPD (chronic obstructive pulmonary disease)   . Obstructive sleep apnea   . Chronic diastolic heart failure     Echocardiogram 05/14/11: Mild LVH, EF 50-55%, no wall motion abnormalities, grade 2 diastolic dysfunction.  . Chest pain     Myoview 05/17/11 demonstrated an EF of 61%, small fixed inferoseptal defect consistent with thinning vs prior infarct; no ischemia. - low risk  . Diabetic neuropathy   . Chronic back pain   . History of stroke   . Mental disorder     bipolar  . CHF (congestive heart failure)   . Stroke   .  Neuromuscular disorder     diabetic neuropathy  . Chronic respiratory failure with hypoxia 08/08/2012  . Noncompliance with medication regimen   . Tobacco abuse     Past Surgical History  Procedure Laterality Date  . No past surgeries       History reviewed. No pertinent family history.   History  Substance Use Topics  . Smoking status: Current Every Day Smoker -- 1.50 packs/day for 29 years    Types: Cigarettes  . Smokeless tobacco: Never Used  . Alcohol Use: No     Review of Systems A complete 10 system review of systems was obtained and all systems are negative except as noted in the HPI and PMH.     Allergies  Flexeril and Muscle relief  Home Medications   Current Outpatient Rx  Name  Route  Sig  Dispense  Refill  . albuterol (PROVENTIL) (5 MG/ML) 0.5% nebulizer solution   Nebulization   Take 2.5 mg by nebulization every 6 (six) hours as needed for wheezing or shortness of breath.          . clonazePAM (KLONOPIN) 0.5 MG tablet   Oral   Take 1 tablet (0.5 mg total) by mouth 2 (two) times daily as needed. For anxiety   30 tablet   0   . diazepam (VALIUM) 10 MG tablet   Oral  Take 1 tablet (10 mg total) by mouth daily as needed for anxiety.   30 tablet   0   . diltiazem (CARDIZEM CD) 300 MG 24 hr capsule   Oral   Take 1 capsule (300 mg total) by mouth daily.   30 capsule   3   . doxycycline (VIBRAMYCIN) 50 MG capsule   Oral   Take 2 capsules (100 mg total) by mouth 2 (two) times daily.   40 capsule   0   . furosemide (LASIX) 20 MG tablet   Oral   Take 20 mg by mouth 2 (two) times daily.         Marland Kitchen HYDROcodone-acetaminophen (NORCO/VICODIN) 5-325 MG per tablet   Oral   Take 1 tablet by mouth every 6 (six) hours as needed for moderate pain.   30 tablet   0   . insulin aspart (NOVOLOG) 100 UNIT/ML injection   Subcutaneous   Inject 0-20 Units into the skin as needed for high blood sugar (Has a sliding scale at home).         Marland Kitchen lithium  carbonate 300 MG capsule   Oral   Take 300 mg by mouth 2 (two) times daily with a meal.         . morphine (MS CONTIN) 15 MG 12 hr tablet   Oral   Take 1 tablet (15 mg total) by mouth 2 (two) times daily.   30 tablet   0   . nicotine (NICODERM CQ - DOSED IN MG/24 HOURS) 21 mg/24hr patch   Transdermal   Place 21 mg onto the skin daily.         Marland Kitchen nystatin-triamcinolone (MYCOLOG II) cream   Topical   Apply 1 application topically daily.         Marland Kitchen omeprazole (PRILOSEC) 20 MG capsule   Oral   Take 20 mg by mouth daily.         . potassium chloride (K-DUR) 10 MEQ tablet   Oral   Take 10 mEq by mouth daily.         Marland Kitchen warfarin (COUMADIN) 5 MG tablet   Oral   Take 1 tablet (5 mg total) by mouth daily.          Temp(Src) 97.8 F (36.6 C)  Resp 19  SpO2 92%  Physical Exam  Nursing note and vitals reviewed. Constitutional: He is oriented to person, place, and time. He appears well-developed and well-nourished.  HENT:  Head: Normocephalic and atraumatic.  Eyes: EOM are normal. Pupils are equal, round, and reactive to light.  Neck: Normal range of motion. Neck supple.  Cardiovascular: Normal rate, normal heart sounds and intact distal pulses.   Pulmonary/Chest: Effort normal and breath sounds normal. No respiratory distress. He has no wheezes. He has no rales.  Abdominal: Bowel sounds are normal. He exhibits no distension. There is no tenderness.  Musculoskeletal: Normal range of motion. He exhibits edema (builateral lower extremity edema to knees). He exhibits no tenderness.  Pt has Unna boot to both legs  Neurological: He is alert and oriented to person, place, and time. He has normal strength. No cranial nerve deficit or sensory deficit.  Skin: Skin is warm and dry. No rash noted.  Psychiatric: He has a normal mood and affect.    ED Course  Procedures (including critical care time)  DIAGNOSTIC STUDIES: Oxygen Saturation is 92% on [place], low by my interpretation.     COORDINATION OF CARE: 9:47 AM-Discussed treatment plan  which includes CXR and labs with pt at bedside and pt agreed to plan.     Labs Review Labs Reviewed  CBC WITH DIFFERENTIAL - Abnormal; Notable for the following:    RBC 3.88 (*)    Hemoglobin 11.1 (*)    HCT 35.6 (*)    RDW 16.3 (*)    All other components within normal limits  BASIC METABOLIC PANEL - Abnormal; Notable for the following:    Glucose, Bld 132 (*)    GFR calc non Af Amer 63 (*)    GFR calc Af Amer 73 (*)    All other components within normal limits  PRO B NATRIURETIC PEPTIDE - Abnormal; Notable for the following:    Pro B Natriuretic peptide (BNP) 1454.0 (*)    All other components within normal limits  PROTIME-INR - Abnormal; Notable for the following:    Prothrombin Time 28.0 (*)    INR 2.73 (*)    All other components within normal limits  TROPONIN I    Imaging Review Dg Chest 2 View  05/17/2013   CLINICAL DATA:  Shortness of Breath  EXAM: CHEST  2 VIEW  COMPARISON:  04/16/2013  FINDINGS: Cardiomediastinal silhouette is stable. There is poor inspiration with bilateral basilar atelectasis. No segmental infiltrate or pulmonary edema.  IMPRESSION: Limited study by poor inspiration. Bilateral basilar atelectasis. No segmental infiltrate or pulmonary edema.   Electronically Signed   By: Natasha Mead M.D.   On: 05/17/2013 10:34     EKG Interpretation   Date/Time:  Sunday May 17 2013 09:59:30 EST Ventricular Rate:  111 PR Interval:    QRS Duration: 114 QT Interval:  304 QTC Calculation: 413 R Axis:   49 Text Interpretation:  Atrial fibrillation with rapid ventricular response  Incomplete right bundle branch block Cannot rule out Anterior infarct ,  age undetermined Abnormal ECG When compared with ECG of 17-Apr-2013 00:36,  No significant change since last tracing Confirmed by [facility]  MD, Leonette Most  [phone]) on 05/17/2013 10:07:55 AM      MDM   Final diagnoses:  None   Labs and imaging reviewed. No  change from baseline. No signs of PNA, acute CHF, COPD exacerbation; doubt ACS or PE. Pt admits that he 'gets panicky' sometimes. Klonopin he has been taking at SNF not helping much. Advised he will need to discuss anxiolytics with staff physician at SNF.     I personally performed the services described in this documentation, which was scribed in my presence. The recorded information has been reviewed and is accurate.      Sean B. Bernette Mayers, MD 05/17/13 1104

## 2013-05-17 NOTE — ED Notes (Signed)
Dr Sheldon at bedside,  

## 2013-05-17 NOTE — ED Notes (Signed)
Pt c/o anxiety after being assisted to bedside commode, pt requesting to sit in wheelchair and something for anxiety. Dr Bernette MayersSheldon notified, additional orders given

## 2013-05-17 NOTE — ED Notes (Signed)
Pt [facility], per EMS pt has had low O2 sats x2 days 70-80's per pt, EMS states the pt was 90% on 3L [place]. Pt appears anxious, and SOB on arrival to ED.

## 2013-05-17 NOTE — Discharge Instructions (Signed)
Shortness of Breath Shortness of breath means you have trouble breathing. Shortness of breath may indicate that you have a medical problem. You should seek immediate medical care for shortness of breath. CAUSES   Not enough oxygen in the air (as with high altitudes or a smoke-filled room).  Short-term (acute) lung disease, including:  Infections, such as pneumonia.  Fluid in the lungs, such as heart failure.  A blood clot in the lungs (pulmonary embolism).  Long-term (chronic) lung diseases.  Heart disease (heart attack, angina, heart failure, and others).  Low red blood cells (anemia).  Poor physical fitness. This can cause shortness of breath when you exercise.  Chest or back injuries or stiffness.  Being overweight.  Smoking.  Anxiety. This can make you feel like you are not getting enough air. DIAGNOSIS  Serious medical problems can usually be found during your physical exam. Tests may also be done to determine why you are having shortness of breath. Tests may include:  Chest X-rays.  Lung function tests.  Blood tests.  Electrocardiography.  Exercise testing.  Echocardiography.  Imaging scans. Your caregiver may not be able to find a cause for your shortness of breath after your exam. In this case, it is important to have a follow-up exam with your caregiver as directed.  TREATMENT  Treatment for shortness of breath depends on the cause of your symptoms and can vary greatly. HOME CARE INSTRUCTIONS   Do not smoke. Smoking is a common cause of shortness of breath. If you smoke, ask for help to quit.  Avoid being around chemicals or things that may bother your breathing, such as paint fumes and dust.  Rest as needed. Slowly resume your usual activities.  If medicines were prescribed, take them as directed for the full length of time directed. This includes oxygen and any inhaled medicines.  Keep all follow-up appointments as directed by your caregiver. SEEK  MEDICAL CARE IF:   Your condition does not improve in the time expected.  You have a hard time doing your normal activities even with rest.  You have any side effects or problems with the medicines prescribed.  You develop any new symptoms. SEEK IMMEDIATE MEDICAL CARE IF:   Your shortness of breath gets worse.  You feel lightheaded, faint, or develop a cough not controlled with medicines.  You start coughing up blood.  You have pain with breathing.  You have chest pain or pain in your arms, shoulders, or abdomen.  You have a fever.  You are unable to walk up stairs or exercise the way you normally do. MAKE SURE YOU:  Understand these instructions.  Will watch your condition.  Will get help right away if you are not doing well or get worse. Document Released: 11/28/2000 Document Revised: 09/04/2011 Document Reviewed: 05/21/2011 [facility] Patient Information 2014 [place], [place].  Panic Attacks Panic attacks are sudden, short-livedsurges of severe anxiety, fear, or discomfort. They may occur for no reason when you are relaxed, when you are anxious, or when you are sleeping. Panic attacks may occur for a number of reasons:   Healthy people occasionally have panic attacks in extreme, life-threatening situations, such as war or natural disasters. Normal anxiety is a protective mechanism of the body that helps [place] react to danger (fight or flight response).  Panic attacks are often seen with anxiety disorders, such as panic disorder, social anxiety disorder, generalized anxiety disorder, and phobias. Anxiety disorders cause excessive or uncontrollable anxiety. They may interfere with your relationships or  other life activities.  Panic attacks are sometimes seen with other mental illnesses such as depression and posttraumatic stress disorder.  Certain medical conditions, prescription medicines, and drugs of abuse can cause panic attacks. SYMPTOMS  Panic attacks start suddenly,  peak within 20 minutes, and are accompanied by four or more of the following symptoms:  Pounding heart or fast heart rate (palpitations).  Sweating.  Trembling or shaking.  Shortness of breath or feeling smothered.  Feeling choked.  Chest pain or discomfort.  Nausea or strange feeling in your stomach.  Dizziness, lightheadedness, or feeling like you will faint.  Chills or hot flushes.  Numbness or tingling in your lips or hands and feet.  Feeling that things are not real or feeling that you are not yourself.  Fear of losing control or going crazy.  Fear of dying. Some of these symptoms can mimic serious medical conditions. For example, you may think you are having a heart attack. Although panic attacks can be very scary, they are not life threatening. DIAGNOSIS  Panic attacks are diagnosed through an assessment by your health care provider. Your health care provider will ask questions about your symptoms, such as where and when they occurred. Your health care provider will also ask about your medical history and use of alcohol and drugs, including prescription medicines. Your health care provider may order blood tests or other studies to rule out a serious medical condition. Your health care provider may refer you to a mental health professional for further evaluation. TREATMENT   Most healthy people who have one or two panic attacks in an extreme, life-threatening situation will not require treatment.  The treatment for panic attacks associated with anxiety disorders or other mental illness typically involves counseling with a mental health professional, medicine, or a combination of both. Your health care provider will help determine what treatment is best for you.  Panic attacks due to physical illness usually goes away with treatment of the illness. If prescription medicine is causing panic attacks, talk with your health care provider about stopping the medicine, decreasing  the dose, or substituting another medicine.  Panic attacks due to alcohol or drug abuse goes away with abstinence. Some adults need professional help in order to stop drinking or using drugs. HOME CARE INSTRUCTIONS   Take all your medicines as prescribed.   Check with your health care provider before starting new prescription or over-the-counter medicines.  Keep all follow up appointments with your health care provider. SEEK MEDICAL CARE IF:  You are not able to take your medicines as prescribed.  Your symptoms do not improve or get worse. SEEK IMMEDIATE MEDICAL CARE IF:   You experience panic attack symptoms that are different than your usual symptoms.  You have serious thoughts about hurting yourself or others.  You are taking medicine for panic attacks and have a serious side effect. MAKE SURE YOU:  Understand these instructions.  Will watch your condition.  Will get help right away if you are not doing well or get worse. Document Released: 03/05/2005 Document Revised: 12/24/2012 Document Reviewed: 10/17/2012 [facility] Patient Information 2014 [place].

## 2013-05-17 NOTE — ED Notes (Signed)
Pt assisted to wheelchair, diet coke given per his request, comfort measure provided,

## 2013-05-17 NOTE — ED Notes (Signed)
Report given Richard LPN [facility],

## 2013-05-22 ENCOUNTER — Encounter: Payer: Medicare Other | Admitting: Cardiovascular Disease

## 2014-11-10 LAB — BLOOD GAS, ARTERIAL
Acid-Base Excess: 13.8 mmol/L — ABNORMAL HIGH (ref 0.0–2.0)
Acid-Base Excess: 7.2 mmol/L — ABNORMAL HIGH (ref 0.0–2.0)
Acid-Base Excess: 8.9 mmol/L — ABNORMAL HIGH (ref 0.0–2.0)
Bicarbonate: 33.5 mEq/L — ABNORMAL HIGH (ref 20.0–24.0)
Bicarbonate: 36.1 mEq/L — ABNORMAL HIGH (ref 20.0–24.0)
Bicarbonate: 38.7 mEq/L — ABNORMAL HIGH (ref 20.0–24.0)
Drawn by: 21310
Drawn by: 38235
Drawn by: 382351
FIO2: 0.45
FIO2: 0.5
FIO2: 1
MECHVT: 550 mL
MECHVT: 550 mL
O2 Saturation: 89.3 %
O2 Saturation: 93.1 %
O2 Saturation: 94.9 %
PEEP: 5 cmH2O
PEEP: 5 cmH2O
Patient temperature: 37
Patient temperature: 37
Patient temperature: 37
RATE: 14 resp/min
RATE: 14 resp/min
TCO2: 30.5 mmol/L (ref 0–100)
TCO2: 33.1 mmol/L (ref 0–100)
pCO2 arterial: 112 mmHg (ref 35.0–45.0)
pCO2 arterial: 51.7 mmHg — ABNORMAL HIGH (ref 35.0–45.0)
pCO2 arterial: 55.9 mmHg — ABNORMAL HIGH (ref 35.0–45.0)
pH, Arterial: 7.134 — CL (ref 7.350–7.450)
pH, Arterial: 7.427 (ref 7.350–7.450)
pH, Arterial: 7.454 — ABNORMAL HIGH (ref 7.350–7.450)
pO2, Arterial: 59.8 mmHg — ABNORMAL LOW (ref 80.0–100.0)
pO2, Arterial: 69.9 mmHg — ABNORMAL LOW (ref 80.0–100.0)
pO2, Arterial: 85.9 mmHg (ref 80.0–100.0)

## 2014-11-17 IMAGING — CR DG CHEST 1V PORT
2 series · 2 of 2 positions shown · non-contrast
Comparison: 10/12/2012

CLINICAL DATA: Short of breath

PORTABLE CHEST - 1 VIEW

[portable (1 of 2)]
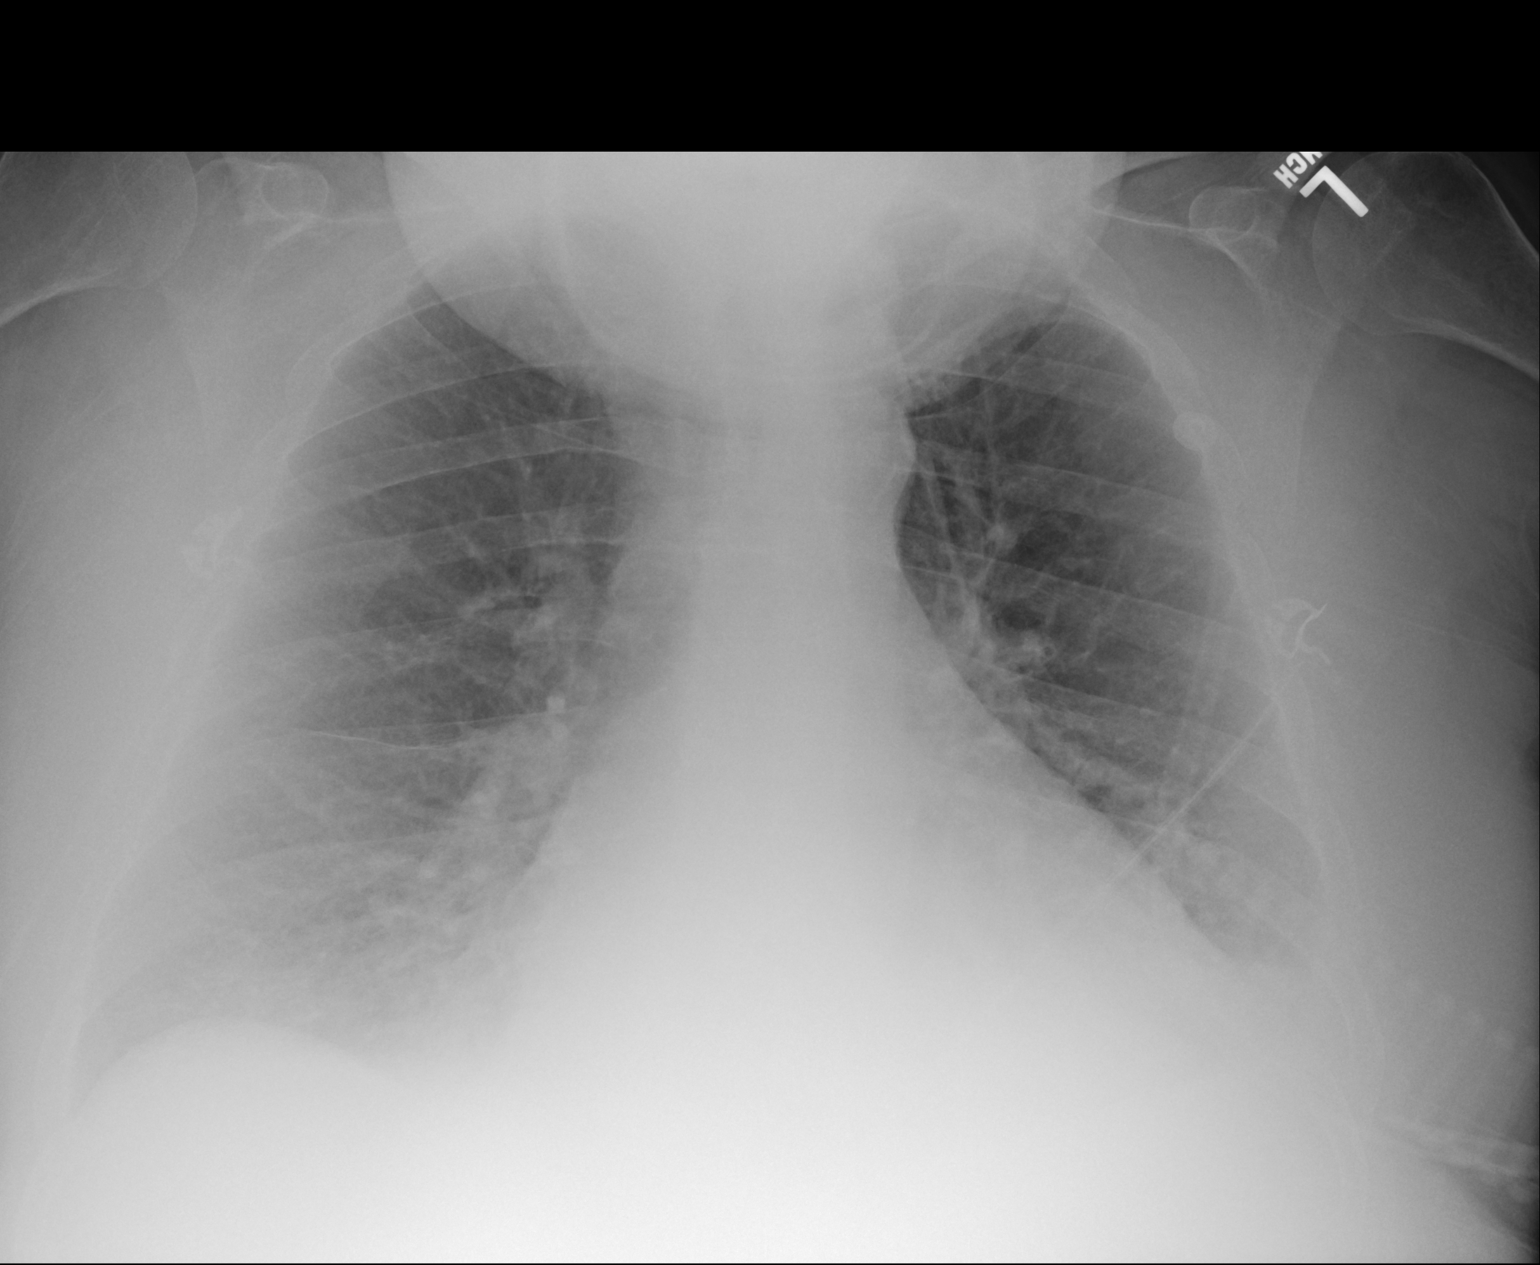

[portable (2 of 2)]
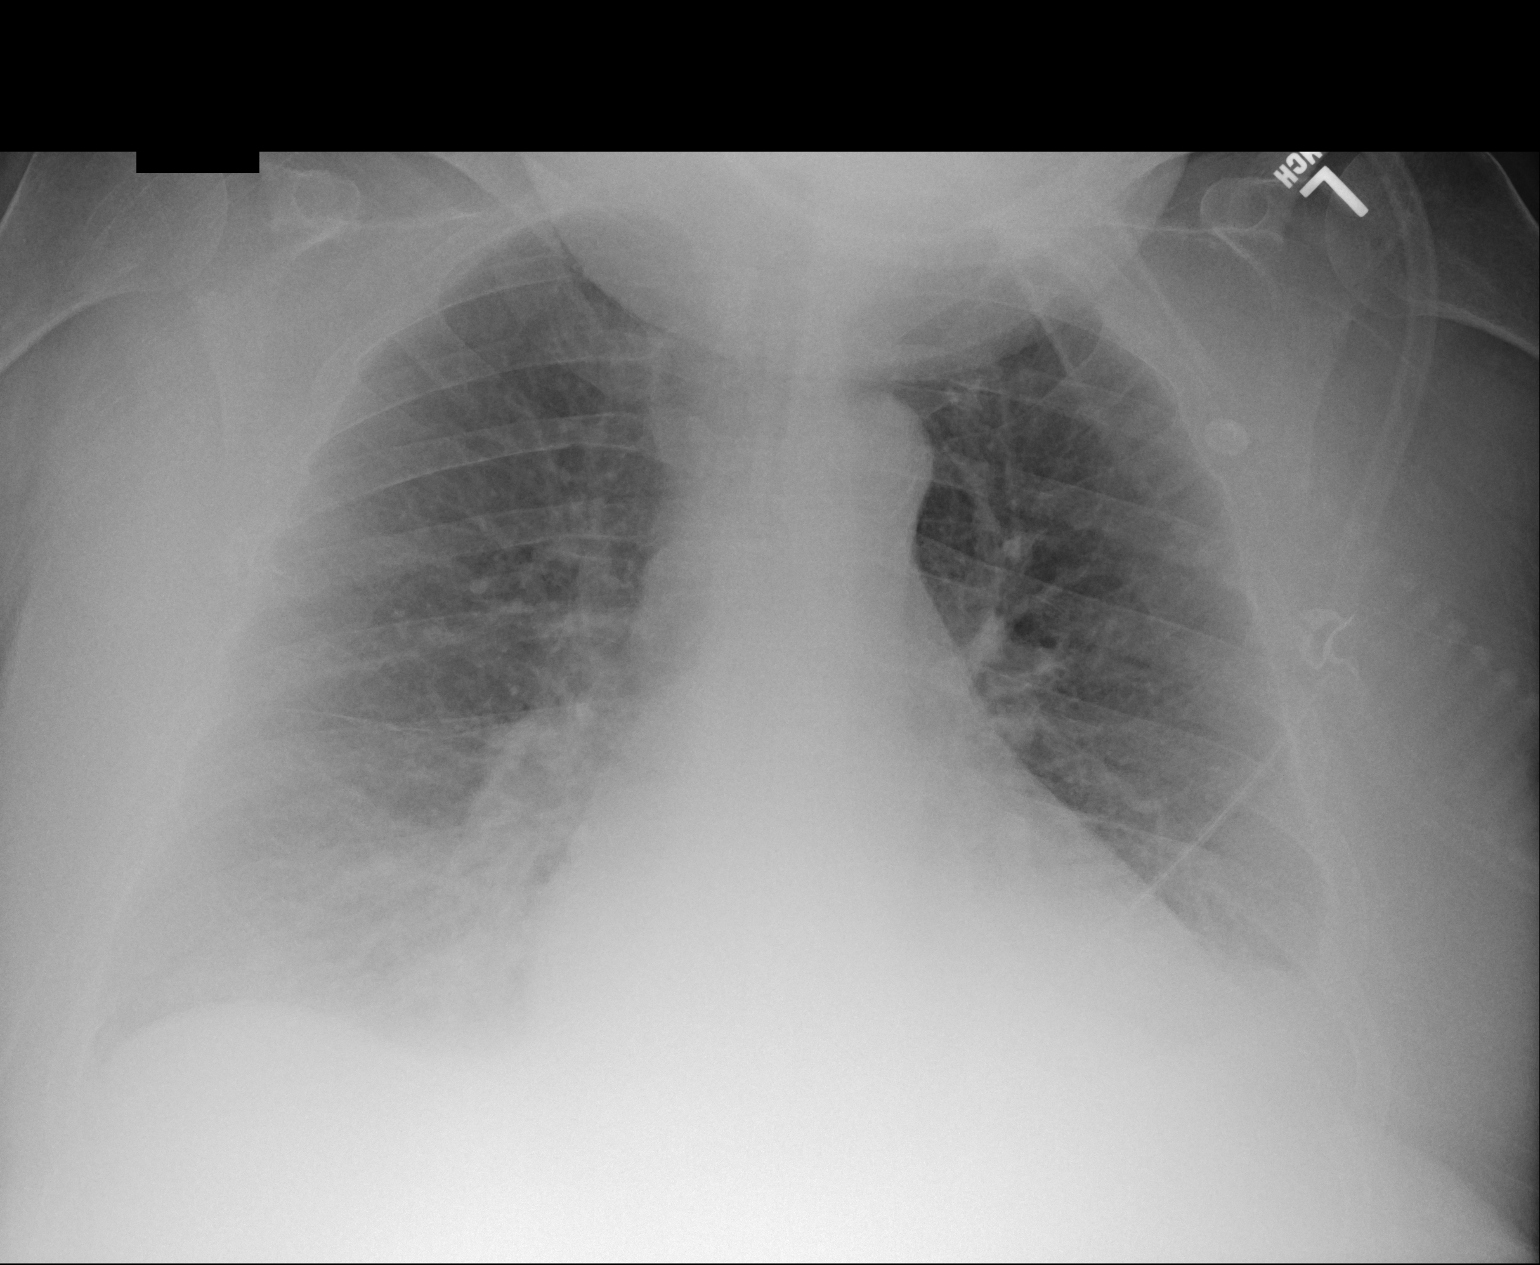

[2 of 2 positions shown; findings below may reference images not displayed]

FINDINGS: Cardiac enlargement with vascular congestion.  Small
pleural effusions and mild bibasilar atelectasis.  Negative for
pneumonia.
IMPRESSION: Congestive heart failure with bilateral pleural effusions and
bibasilar atelectasis.

## 2014-12-04 IMAGING — CR DG CHEST 1V PORT
1 series · 1 of 1 positions shown · non-contrast
Comparison: Chest radiograph 10/31/2012

CLINICAL DATA: Shortness of breath.  Drug overdose.

PORTABLE CHEST - 1 VIEW

[portable]
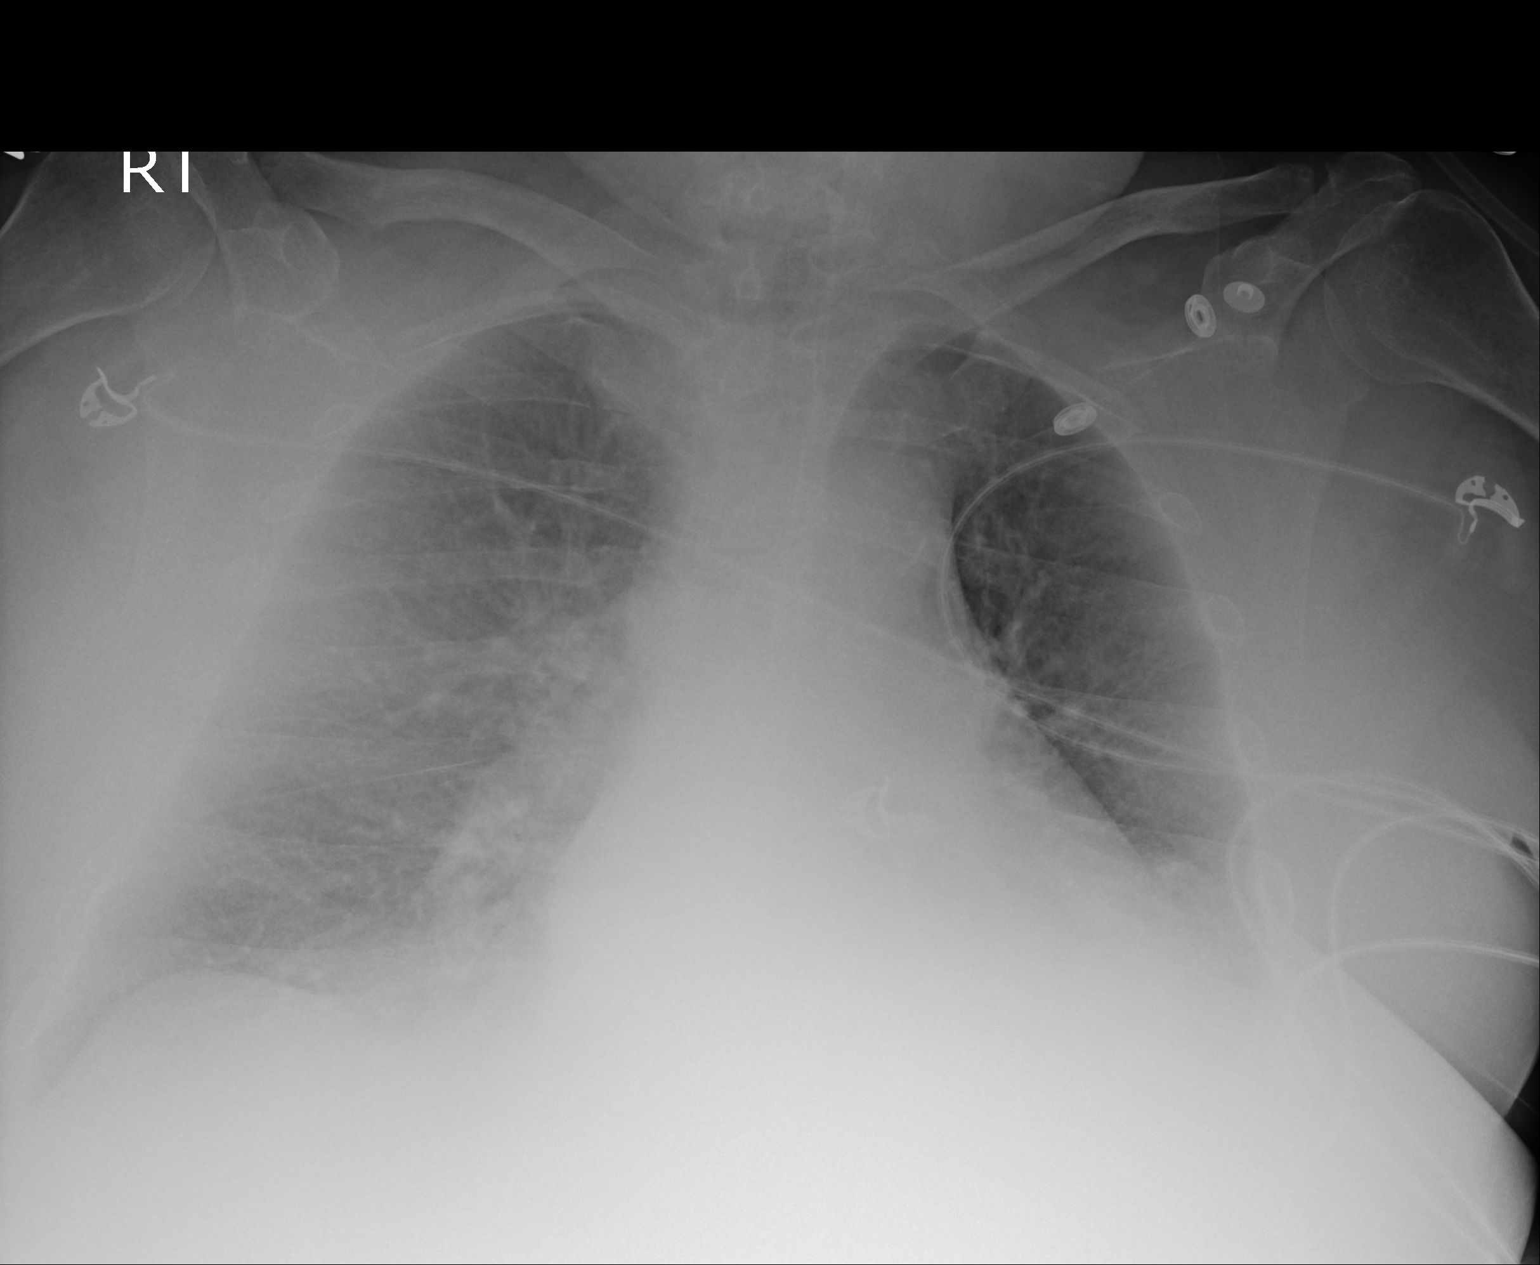

[1 of 1 positions shown; findings below may reference images not displayed]

FINDINGS: Cardiopericardial silhouette appears prominent for
portable technique, and is stable.  There is diffuse pulmonary
vascular congestion and diffuse interstitial prominence.   There
are bibasilar opacities, left greater than right.  Left pleural
effusion cannot be excluded.  No significant change in aeration of
the lungs compared to 10/31/2012.
IMPRESSION: Similar appearance of the chest compared to 10/31/2012.
Similar congestive heart failure pattern.  Bibasilar opacities,
left greater than right, could reflect a combination of atelectasis
and possibly pleural effusion.  Airspace disease secondary to
aspiration or pneumonia cannot be excluded at the lung bases by
chest radiograph.

## 2014-12-14 IMAGING — CT CT HEAD W/O CM
1 of 2 series · 13 of 30 positions shown, 17 images · non-contrast
Comparison: None

CLINICAL DATA: History of injury from fall.  History of
disorientation and confusion and agitation.  History of
hypertension, diabetes, CVA, and bipolar disorder.

CT HEAD WITHOUT CONTRAST
TECHNIQUE: Contiguous axial images were obtained from the base of
the skull through the vertex without contrast.  History that the
patient was disoriented and confused and could not hold still.
Best obtainable scan was obtained.

[Series 2: headseq 4.8 h37s · axial · 0.43mm/px · z∈[+1150,+1298]mm · 13 of 36 slices shown, 17 images]
[im 3/36  brain]
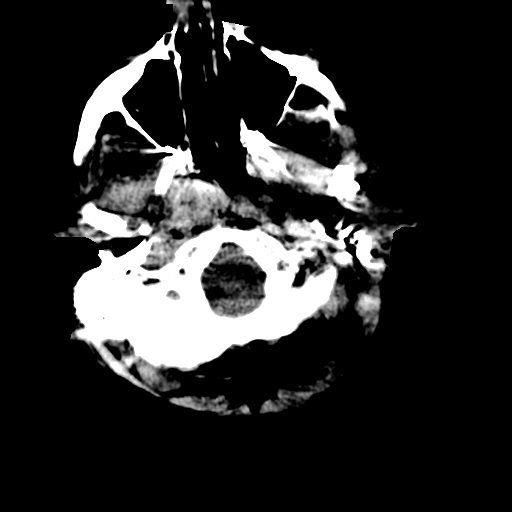
[im 3/36  bone]
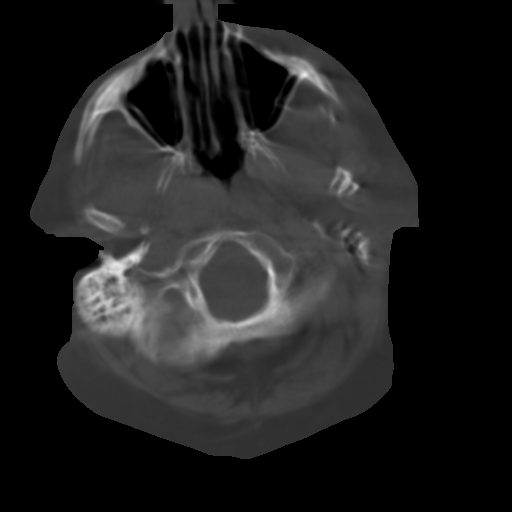
[im 6/36  brain]
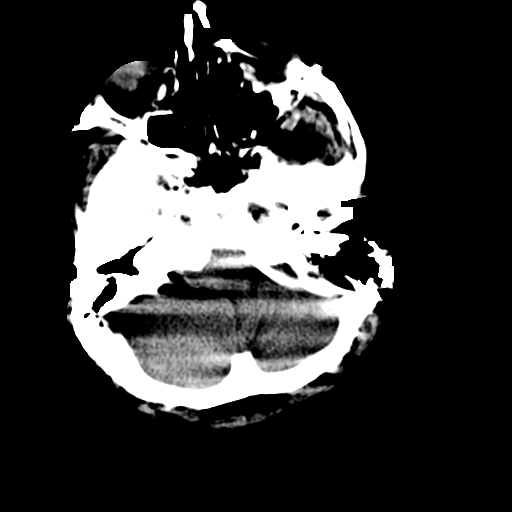
[im 8/36  brain]
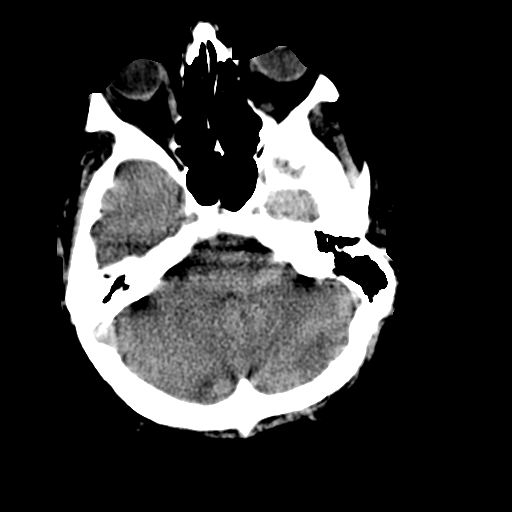
[im 11/36  brain]
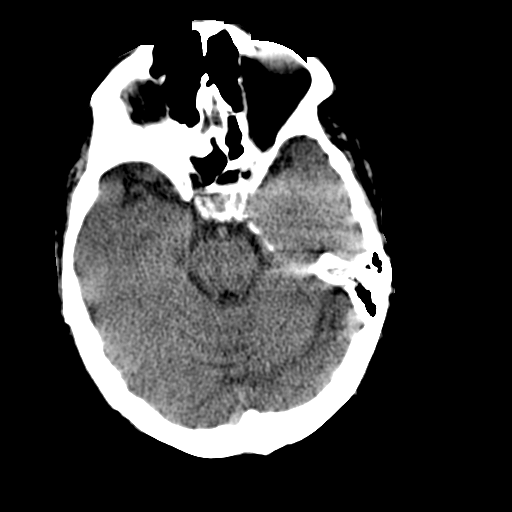
[im 13/36  brain]
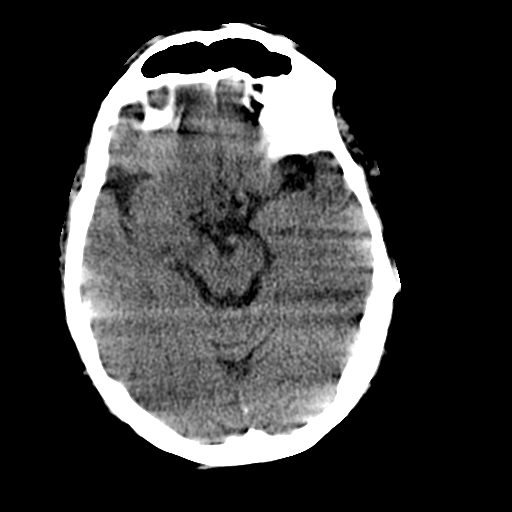
[im 13/36  bone]
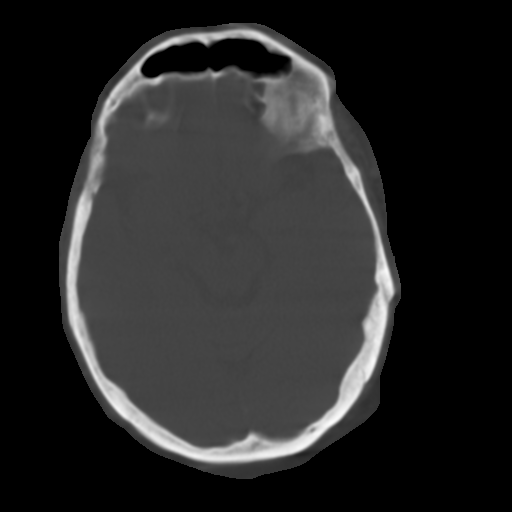
[im 16/36  brain]
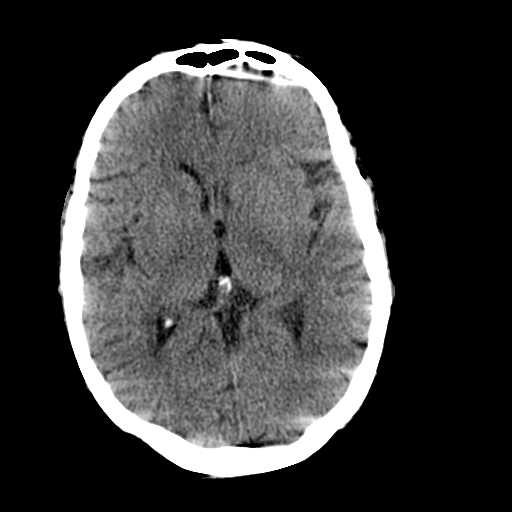
[im 18/36  brain]
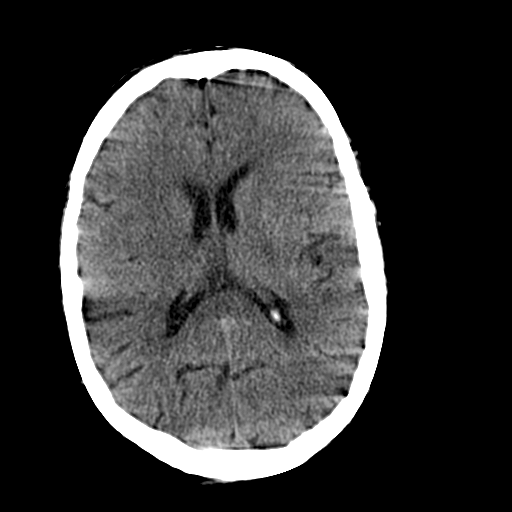
[im 21/36  brain]
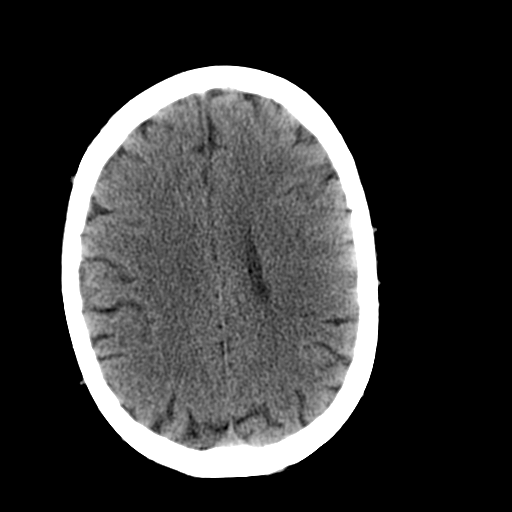
[im 23/36  brain]
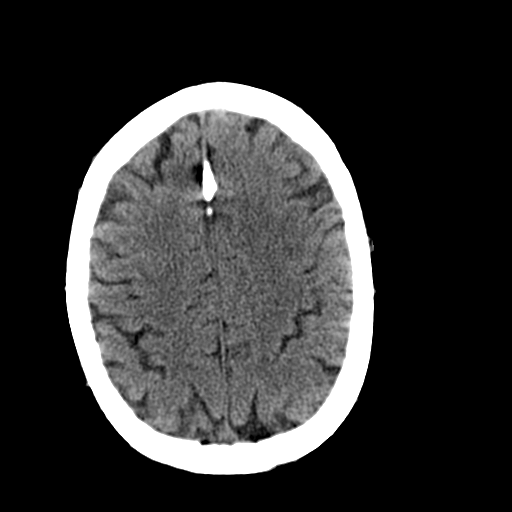
[im 23/36  bone]
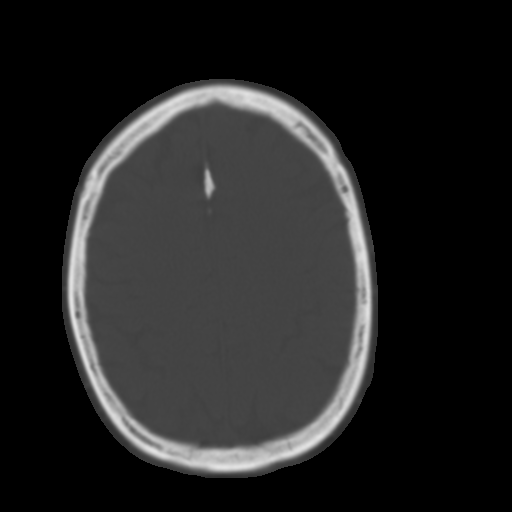
[im 26/36  brain]
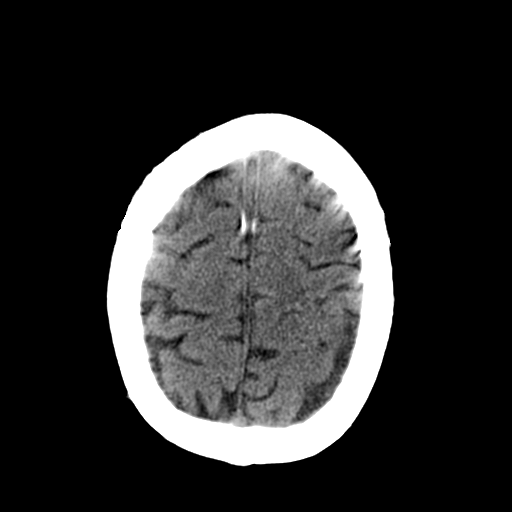
[im 28/36  brain]
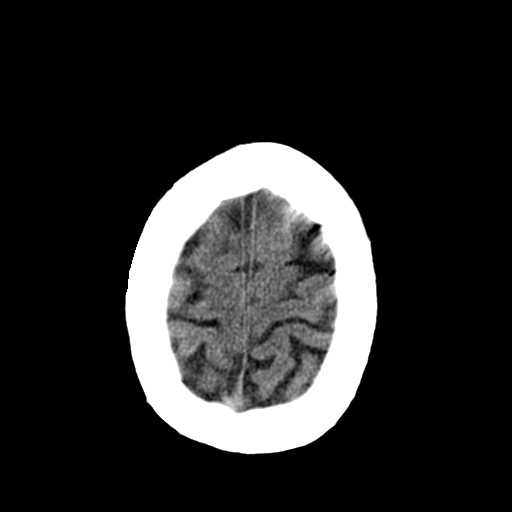
[im 31/36  brain]
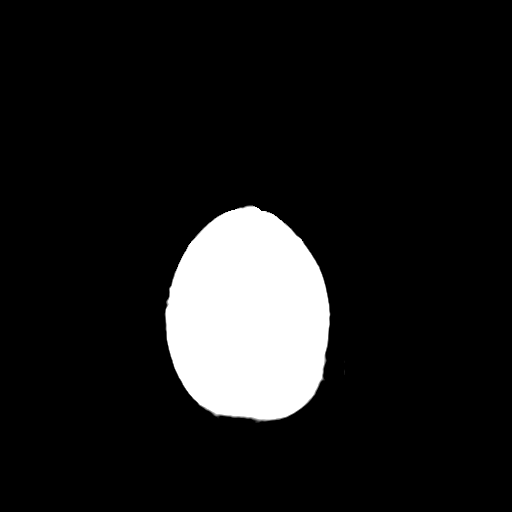
[im 33/36  brain]
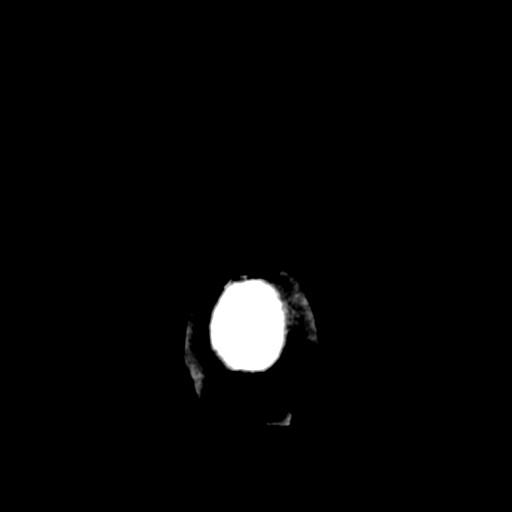
[im 33/36  bone]
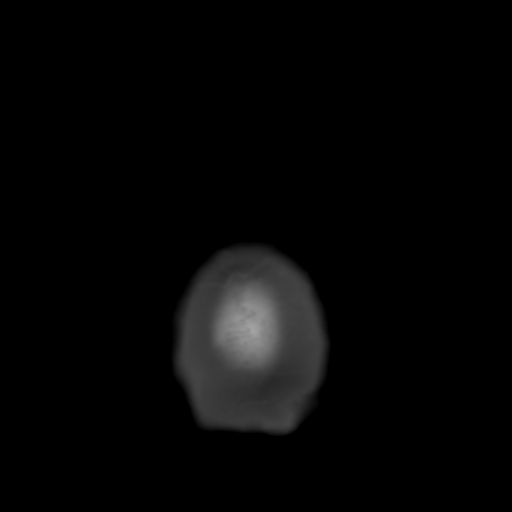

[13 of 30 positions shown; findings below may reference images not displayed]

FINDINGS: Motion artifact degrades some of the images.  Two
sequences of images were obtained.

There is no evidence of brain mass, brain hemorrhage, or acute
infarction.

There is a tiny hypodense area is seen in the deep right parietal
region consistent with a tiny area of previous lacunar infarction.

The ventricular system is normal size and shape.  There is no
evidence of shift of midline structures, parenchymal lesion, or
subdural or epidural hematoma.

The calvarium is intact.  Mastoids are well aerated.  No sinusitis
is evident.
IMPRESSION: Motion artifact degrades some of the images.

There is no evidence of brain mass, brain hemorrhage, or acute
infarction.

There is a tiny hypodense area is seen in the deep right parietal
region consistent with a tiny area of previous lacunar infarction.

No acute or active brain process is seen.  No skull lesion is
evident.  No sinusitis is evident.
